# Patient Record
Sex: Male | Born: 1955 | Race: White | Hispanic: No | Smoking: Current every day smoker
Health system: Southern US, Community
[De-identification: ages and names within clinical notes are randomized; demographics above are authoritative.]

## PROBLEM LIST (undated history)

## (undated) ENCOUNTER — Emergency Department (HOSPITAL_COMMUNITY): Admission: EM | Payer: Self-pay | Source: Home / Self Care

## (undated) DIAGNOSIS — E119 Type 2 diabetes mellitus without complications: Secondary | ICD-10-CM

## (undated) DIAGNOSIS — I10 Essential (primary) hypertension: Secondary | ICD-10-CM

## (undated) DIAGNOSIS — E1351 Other specified diabetes mellitus with diabetic peripheral angiopathy without gangrene: Secondary | ICD-10-CM

## (undated) DIAGNOSIS — R569 Unspecified convulsions: Secondary | ICD-10-CM

## (undated) DIAGNOSIS — H409 Unspecified glaucoma: Secondary | ICD-10-CM

## (undated) DIAGNOSIS — I639 Cerebral infarction, unspecified: Secondary | ICD-10-CM

## (undated) DIAGNOSIS — E7800 Pure hypercholesterolemia, unspecified: Secondary | ICD-10-CM

## (undated) DIAGNOSIS — E1140 Type 2 diabetes mellitus with diabetic neuropathy, unspecified: Secondary | ICD-10-CM

## (undated) DIAGNOSIS — K219 Gastro-esophageal reflux disease without esophagitis: Secondary | ICD-10-CM

## (undated) DIAGNOSIS — I6529 Occlusion and stenosis of unspecified carotid artery: Secondary | ICD-10-CM

## (undated) HISTORY — PX: EYE SURGERY: SHX253

## (undated) HISTORY — PX: OTHER SURGICAL HISTORY: SHX169

---

## 2000-07-31 ENCOUNTER — Emergency Department (HOSPITAL_COMMUNITY): Admission: EM | Admit: 2000-07-31 | Discharge: 2000-07-31 | Payer: Self-pay | Admitting: Emergency Medicine

## 2000-08-17 ENCOUNTER — Ambulatory Visit (HOSPITAL_COMMUNITY): Admission: RE | Admit: 2000-08-17 | Discharge: 2000-08-17 | Payer: Self-pay | Admitting: Ophthalmology

## 2003-03-18 ENCOUNTER — Emergency Department (HOSPITAL_COMMUNITY): Admission: EM | Admit: 2003-03-18 | Discharge: 2003-03-18 | Payer: Self-pay | Admitting: Emergency Medicine

## 2005-03-09 ENCOUNTER — Emergency Department (HOSPITAL_COMMUNITY): Admission: EM | Admit: 2005-03-09 | Discharge: 2005-03-09 | Payer: Self-pay | Admitting: Emergency Medicine

## 2005-03-11 ENCOUNTER — Emergency Department (HOSPITAL_COMMUNITY): Admission: EM | Admit: 2005-03-11 | Discharge: 2005-03-11 | Payer: Self-pay | Admitting: Emergency Medicine

## 2006-09-18 ENCOUNTER — Emergency Department (HOSPITAL_COMMUNITY): Admission: EM | Admit: 2006-09-18 | Discharge: 2006-09-18 | Payer: Self-pay | Admitting: Emergency Medicine

## 2009-11-19 ENCOUNTER — Ambulatory Visit: Payer: Self-pay | Admitting: Cardiology

## 2009-11-19 ENCOUNTER — Inpatient Hospital Stay (HOSPITAL_COMMUNITY): Admission: EM | Admit: 2009-11-19 | Discharge: 2009-11-23 | Payer: Self-pay | Admitting: Internal Medicine

## 2009-11-19 ENCOUNTER — Encounter: Payer: Self-pay | Admitting: Emergency Medicine

## 2009-11-20 ENCOUNTER — Encounter (INDEPENDENT_AMBULATORY_CARE_PROVIDER_SITE_OTHER): Payer: Self-pay | Admitting: Internal Medicine

## 2009-11-22 ENCOUNTER — Ambulatory Visit: Payer: Self-pay | Admitting: Vascular Surgery

## 2009-12-06 ENCOUNTER — Emergency Department (HOSPITAL_COMMUNITY): Admission: EM | Admit: 2009-12-06 | Discharge: 2009-12-06 | Payer: Self-pay | Admitting: Emergency Medicine

## 2010-05-22 LAB — CULTURE, ROUTINE-ABSCESS

## 2010-05-22 LAB — CBC
HCT: 42.1 % (ref 39.0–52.0)
HCT: 43.3 % (ref 39.0–52.0)
Hemoglobin: 14.2 g/dL (ref 13.0–17.0)
MCH: 28.8 pg (ref 26.0–34.0)
MCH: 29 pg (ref 26.0–34.0)
MCHC: 32.9 g/dL (ref 30.0–36.0)
MCV: 87.5 fL (ref 78.0–100.0)
MCV: 88.8 fL (ref 78.0–100.0)
Platelets: 290 10*3/uL (ref 150–400)
Platelets: 286 10*3/uL (ref 150–400)
RBC: 4.9 MIL/uL (ref 4.22–5.81)
RBC: 4.88 MIL/uL (ref 4.22–5.81)
RDW: 13.1 % (ref 11.5–15.5)
RDW: 13 % (ref 11.5–15.5)
WBC: 11.3 10*3/uL — ABNORMAL HIGH (ref 4.0–10.5)
WBC: 10.7 10*3/uL — ABNORMAL HIGH (ref 4.0–10.5)

## 2010-05-22 LAB — POCT I-STAT, CHEM 8
HCT: 47 % (ref 39.0–52.0)
Hemoglobin: 16 g/dL (ref 13.0–17.0)
Sodium: 139 mEq/L (ref 135–145)
TCO2: 32 mmol/L (ref 0–100)

## 2010-05-22 LAB — CARDIAC PANEL(CRET KIN+CKTOT+MB+TROPI)
CK, MB: 6 ng/mL — ABNORMAL HIGH (ref 0.3–4.0)
CK, MB: 6.9 ng/mL (ref 0.3–4.0)
Relative Index: 5.6 — ABNORMAL HIGH (ref 0.0–2.5)
Relative Index: 5.9 — ABNORMAL HIGH (ref 0.0–2.5)
Total CK: 116 U/L (ref 7–232)

## 2010-05-22 LAB — DIFFERENTIAL
Basophils Absolute: 0.1 10*3/uL (ref 0.0–0.1)
Basophils Absolute: 0 10*3/uL (ref 0.0–0.1)
Basophils Relative: 1 % (ref 0–1)
Eosinophils Absolute: 0.3 10*3/uL (ref 0.0–0.7)
Eosinophils Relative: 2 % (ref 0–5)
Eosinophils Relative: 1 % (ref 0–5)
Lymphocytes Relative: 16 % (ref 12–46)
Lymphocytes Relative: 24 % (ref 12–46)
Lymphs Abs: 2.6 10*3/uL (ref 0.7–4.0)
Neutro Abs: 7.4 10*3/uL (ref 1.7–7.7)

## 2010-05-22 LAB — LIPID PANEL
Cholesterol: 109 mg/dL (ref 0–200)
LDL Cholesterol: 48 mg/dL (ref 0–99)
Total CHOL/HDL Ratio: 3.6 RATIO

## 2010-05-22 LAB — GLUCOSE, CAPILLARY
Glucose-Capillary: 105 mg/dL — ABNORMAL HIGH (ref 70–99)
Glucose-Capillary: 159 mg/dL — ABNORMAL HIGH (ref 70–99)
Glucose-Capillary: 277 mg/dL — ABNORMAL HIGH (ref 70–99)
Glucose-Capillary: 191 mg/dL — ABNORMAL HIGH (ref 70–99)
Glucose-Capillary: 222 mg/dL — ABNORMAL HIGH (ref 70–99)
Glucose-Capillary: 260 mg/dL — ABNORMAL HIGH (ref 70–99)

## 2010-05-22 LAB — CULTURE, BLOOD (ROUTINE X 2)

## 2010-05-22 LAB — BASIC METABOLIC PANEL
CO2: 30 mEq/L (ref 19–32)
Calcium: 9 mg/dL (ref 8.4–10.5)
Chloride: 107 mEq/L (ref 96–112)
Chloride: 103 mEq/L (ref 96–112)
Creatinine, Ser: 1.05 mg/dL (ref 0.4–1.5)
GFR calc Af Amer: >60 mL/min (ref >60)
GFR calc Af Amer: >60 mL/min (ref >60)
GFR calc non Af Amer: >60 mL/min (ref >60)
Potassium: 3.7 mEq/L (ref 3.5–5.1)
Potassium: 3.5 mEq/L (ref 3.5–5.1)
Sodium: 142 mEq/L (ref 135–145)

## 2010-05-22 LAB — APTT: aPTT: 32 seconds (ref 24–37)

## 2010-05-22 LAB — MRSA PCR SCREENING: MRSA by PCR: NEGATIVE

## 2010-05-22 LAB — HOMOCYSTEINE: Homocysteine: 10.3 umol/L (ref 4.0–15.4)

## 2010-05-22 LAB — PROTIME-INR: INR: 1.02 (ref 0.00–1.49)

## 2010-05-22 LAB — HEMOGLOBIN A1C
Hgb A1c MFr Bld: 8.4 % — ABNORMAL HIGH (ref <5.7)
Mean Plasma Glucose: 194 mg/dL — ABNORMAL HIGH (ref <117)

## 2010-12-23 LAB — DIFFERENTIAL
Basophils Absolute: 0.1
Basophils Relative: 1
Lymphocytes Relative: 28
Monocytes Relative: 5
Neutro Abs: 7.1
Neutrophils Relative %: 64

## 2010-12-23 LAB — POCT CARDIAC MARKERS
CKMB, poc: 1.7
CKMB, poc: <1 — ABNORMAL LOW
Myoglobin, poc: 38.7
Myoglobin, poc: 40.5
Operator id: 211291
Operator id: 221061
Troponin i, poc: <0.05
Troponin i, poc: <0.05

## 2010-12-23 LAB — BASIC METABOLIC PANEL
CO2: 25
Calcium: 9.5
Creatinine, Ser: 0.89
GFR calc Af Amer: >60
GFR calc non Af Amer: >60
Sodium: 135

## 2010-12-23 LAB — CBC
MCHC: 34
RBC: 5.69
RDW: 13.4

## 2011-03-16 HISTORY — PX: OTHER SURGICAL HISTORY: SHX169

## 2012-02-07 HISTORY — PX: OTHER SURGICAL HISTORY: SHX169

## 2012-03-03 ENCOUNTER — Emergency Department (HOSPITAL_COMMUNITY)
Admission: EM | Admit: 2012-03-03 | Discharge: 2012-03-03 | Disposition: A | Discharge status: Other Acute Inpatient Hospital | Payer: Non-veteran care | Attending: Emergency Medicine | Admitting: Emergency Medicine

## 2012-03-03 ENCOUNTER — Encounter (HOSPITAL_COMMUNITY): Payer: Self-pay

## 2012-03-03 ENCOUNTER — Emergency Department (HOSPITAL_COMMUNITY): Payer: Non-veteran care

## 2012-03-03 DIAGNOSIS — E1169 Type 2 diabetes mellitus with other specified complication: Secondary | ICD-10-CM | POA: Insufficient documentation

## 2012-03-03 DIAGNOSIS — I10 Essential (primary) hypertension: Secondary | ICD-10-CM | POA: Insufficient documentation

## 2012-03-03 DIAGNOSIS — Z8669 Personal history of other diseases of the nervous system and sense organs: Secondary | ICD-10-CM | POA: Insufficient documentation

## 2012-03-03 DIAGNOSIS — I891 Lymphangitis: Secondary | ICD-10-CM | POA: Insufficient documentation

## 2012-03-03 DIAGNOSIS — E1129 Type 2 diabetes mellitus with other diabetic kidney complication: Secondary | ICD-10-CM | POA: Insufficient documentation

## 2012-03-03 DIAGNOSIS — Z8719 Personal history of other diseases of the digestive system: Secondary | ICD-10-CM | POA: Insufficient documentation

## 2012-03-03 DIAGNOSIS — N058 Unspecified nephritic syndrome with other morphologic changes: Secondary | ICD-10-CM | POA: Insufficient documentation

## 2012-03-03 DIAGNOSIS — E7800 Pure hypercholesterolemia, unspecified: Secondary | ICD-10-CM | POA: Insufficient documentation

## 2012-03-03 DIAGNOSIS — R739 Hyperglycemia, unspecified: Secondary | ICD-10-CM

## 2012-03-03 DIAGNOSIS — E11628 Type 2 diabetes mellitus with other skin complications: Secondary | ICD-10-CM

## 2012-03-03 DIAGNOSIS — L97509 Non-pressure chronic ulcer of other part of unspecified foot with unspecified severity: Secondary | ICD-10-CM | POA: Insufficient documentation

## 2012-03-03 HISTORY — DX: Unspecified convulsions: R56.9

## 2012-03-03 HISTORY — DX: Pure hypercholesterolemia, unspecified: E78.00

## 2012-03-03 HISTORY — DX: Essential (primary) hypertension: I10

## 2012-03-03 HISTORY — DX: Type 2 diabetes mellitus with diabetic neuropathy, unspecified: E11.40

## 2012-03-03 HISTORY — DX: Gastro-esophageal reflux disease without esophagitis: K21.9

## 2012-03-03 LAB — CBC WITH DIFFERENTIAL/PLATELET
Basophils Absolute: 0.1 10*3/uL (ref 0.0–0.1)
Basophils Relative: 0 % (ref 0–1)
Eosinophils Absolute: 0.1 10*3/uL (ref 0.0–0.7)
Hemoglobin: 14.4 g/dL (ref 13.0–17.0)
MCH: 28.7 pg (ref 26.0–34.0)
MCHC: 34.4 g/dL (ref 30.0–36.0)
Neutro Abs: 9.8 10*3/uL — ABNORMAL HIGH (ref 1.7–7.7)
Neutrophils Relative %: 80 % — ABNORMAL HIGH (ref 43–77)
Platelets: 377 10*3/uL (ref 150–400)
RDW: 12 % (ref 11.5–15.5)

## 2012-03-03 LAB — HEPATIC FUNCTION PANEL
ALT: 8 U/L (ref 0–53)
AST: 10 U/L (ref 0–37)
Total Protein: 8.2 g/dL (ref 6.0–8.3)

## 2012-03-03 LAB — GLUCOSE, CAPILLARY: Glucose-Capillary: 316 mg/dL — ABNORMAL HIGH (ref 70–99)

## 2012-03-03 LAB — BASIC METABOLIC PANEL
Chloride: 92 mEq/L — ABNORMAL LOW (ref 96–112)
GFR calc Af Amer: 83 mL/min — ABNORMAL LOW (ref >90)
GFR calc non Af Amer: 71 mL/min — ABNORMAL LOW (ref >90)
Potassium: 4 mEq/L (ref 3.5–5.1)
Sodium: 130 mEq/L — ABNORMAL LOW (ref 135–145)

## 2012-03-03 MED ORDER — SODIUM CHLORIDE 0.9 % IV SOLN: INTRAVENOUS | Status: DC

## 2012-03-03 MED ORDER — VANCOMYCIN HCL IN DEXTROSE 1-5 GM/200ML-% IV SOLN
1000.0000 mg | Freq: Once | INTRAVENOUS | Status: AC
  Administered 2012-03-03: 1000 mg via INTRAVENOUS
  Filled 2012-03-03: qty 200

## 2012-03-03 MED ORDER — ONDANSETRON HCL 4 MG/2ML IJ SOLN
4.0000 mg | Freq: Once | INTRAMUSCULAR | Status: AC
  Administered 2012-03-03: 4 mg via INTRAVENOUS
  Filled 2012-03-03: qty 2

## 2012-03-03 MED ORDER — INSULIN ASPART 100 UNIT/ML ~~LOC~~ SOLN
8.0000 [IU] | Freq: Once | SUBCUTANEOUS | Status: AC
  Administered 2012-03-03: 8 [IU] via SUBCUTANEOUS
  Filled 2012-03-03: qty 1

## 2012-03-03 MED ORDER — MORPHINE SULFATE 4 MG/ML IJ SOLN
4.0000 mg | Freq: Once | INTRAMUSCULAR | Status: AC
  Administered 2012-03-03: 4 mg via INTRAVENOUS
  Filled 2012-03-03: qty 1

## 2012-03-03 MED ORDER — MORPHINE SULFATE 4 MG/ML IJ SOLN: 4.0000 mg | Freq: Once | INTRAMUSCULAR | Status: DC

## 2012-03-03 MED ORDER — SODIUM CHLORIDE 0.9 % IV BOLUS (SEPSIS)
1000.0000 mL | Freq: Once | INTRAVENOUS | Status: AC
  Administered 2012-03-03: 1000 mL via INTRAVENOUS

## 2012-03-03 NOTE — ED Notes (Signed)
Pt CBG is 316, Burgess Amor PA is aware.

## 2012-03-03 NOTE — ED Notes (Signed)
Report given to Jae Dire, Charity fundraiser at [place], [place] [facility].

## 2012-03-03 NOTE — ED Provider Notes (Signed)
History     CSN: [id number]  Arrival date & time 03/03/12  0830   First MD Initiated Contact with Patient 03/03/12 [phone]      Chief Complaint  Patient presents with  . Wound Check    (Consider location/radiation/quality/duration/timing/severity/associated sxs/prior treatment) HPI  Past Medical History  Diagnosis Date  . Diabetes mellitus without complication   . Diabetic neuropathy   . Seizures   . Hypertension   . Acid reflux   . Hypercholesteremia     Past Surgical History  Procedure Date  . Eye surgery     No family history on file.  History  Substance Use Topics  . Smoking status: Smoker, Current Status Unknown  . Smokeless tobacco: Not on file  . Alcohol Use: No      Review of Systems  Allergies  Flagyl and Penicillins  Home Medications  No current outpatient prescriptions on file.  BP 178/68  Pulse 67  Temp 97.7 F (36.5 C) (Oral)  Resp 16  Ht 5\' 9"  (1.753 m)  Wt 190 lb (86.183 kg)  BMI 28.06 kg/m2  SpO2 98%  Physical Exam  ED Course  Procedures (including critical care time)  Labs Reviewed  GLUCOSE, CAPILLARY - Abnormal; Notable for the following:    Glucose-Capillary 316 (*)     All other components within normal limits  CBC WITH DIFFERENTIAL - Abnormal; Notable for the following:    WBC 12.3 (*)     Neutrophils Relative 80 (*)     Neutro Abs 9.8 (*)     All other components within normal limits  BASIC METABOLIC PANEL - Abnormal; Notable for the following:    Sodium 130 (*)     Chloride 92 (*)     Glucose, Bld 361 (*)     GFR calc non Af Amer 71 (*)     GFR calc Af Amer 83 (*)     All other components within normal limits  HEPATIC FUNCTION PANEL - Abnormal; Notable for the following:    Albumin 3.3 (*)     All other components within normal limits  GLUCOSE, CAPILLARY - Abnormal; Notable for the following:    Glucose-Capillary 228 (*)     All other components within normal limits   Dg Foot Complete Left  03/03/2012   *RADIOLOGY REPORT*  Clinical Data: Pain and swelling over the great toe with erythema extending into the foot.  LEFT FOOT - COMPLETE 3+ VIEW  Comparison: None.  Findings: Soft tissue swelling is present over the great tear.  No underlying osseous abnormalities are present.  The joints located. A small plantar calcaneal spur is evident.  No other focal bone or soft tissue abnormality is present.  IMPRESSION:  1.  Soft tissue swelling of the great toe without evidence for acute osseous abnormality. 2.  Small plantar calcaneal spur.   Original Report Authenticated By: Marin Roberts, M.D.      1. Diabetic infection of left foot   2. Lymphangitis   3. Hyperglycemia     Pt was given IV vancomycin 1 gram prior to transfer to [facility].  MDM  Call placed to [facility] Dr. Candida Peeling who accepts this patient for ed to ed transfer / admission.    Patients labs and/or radiological studies were reviewed during the medical decision making and disposition process.     Burgess Amor, PA 03/03/12 1343  Burgess Amor, PA 03/03/12 1343

## 2012-03-03 NOTE — ED Notes (Signed)
Pt reports wound to left foot great toe for several weeks, "got to digging in it on Tuesday" and now painful and black

## 2012-03-03 NOTE — ED Notes (Signed)
Patient about to eat honeybun when I entered room.  Explained why he couldn't have this.  After checking w/Julie Idol, PA, ordered diabetic lunch tray for patient.  Explained delay on admission process.

## 2012-03-04 NOTE — ED Provider Notes (Signed)
Medical screening examination/treatment/procedure(s) were performed by non-physician practitioner and as supervising physician I was immediately available for consultation/collaboration.   Benny Lennert, MD 03/04/12 [phone]

## 2012-04-11 ENCOUNTER — Encounter (HOSPITAL_COMMUNITY): Payer: Self-pay

## 2012-04-11 ENCOUNTER — Emergency Department (HOSPITAL_COMMUNITY): Payer: Non-veteran care

## 2012-04-11 ENCOUNTER — Inpatient Hospital Stay (HOSPITAL_COMMUNITY)
Admission: EM | Admit: 2012-04-11 | Discharge: 2012-04-12 | DRG: 637 | Disposition: A | Discharge status: Other Acute Inpatient Hospital | Payer: Non-veteran care | Attending: Internal Medicine | Admitting: Internal Medicine

## 2012-04-11 DIAGNOSIS — D509 Iron deficiency anemia, unspecified: Secondary | ICD-10-CM | POA: Diagnosis present

## 2012-04-11 DIAGNOSIS — E1142 Type 2 diabetes mellitus with diabetic polyneuropathy: Secondary | ICD-10-CM | POA: Diagnosis present

## 2012-04-11 DIAGNOSIS — I739 Peripheral vascular disease, unspecified: Secondary | ICD-10-CM | POA: Diagnosis present

## 2012-04-11 DIAGNOSIS — G40909 Epilepsy, unspecified, not intractable, without status epilepticus: Secondary | ICD-10-CM | POA: Diagnosis present

## 2012-04-11 DIAGNOSIS — M9080 Osteopathy in diseases classified elsewhere, unspecified site: Secondary | ICD-10-CM | POA: Diagnosis present

## 2012-04-11 DIAGNOSIS — E1165 Type 2 diabetes mellitus with hyperglycemia: Secondary | ICD-10-CM | POA: Diagnosis present

## 2012-04-11 DIAGNOSIS — I798 Other disorders of arteries, arterioles and capillaries in diseases classified elsewhere: Secondary | ICD-10-CM | POA: Diagnosis present

## 2012-04-11 DIAGNOSIS — E1351 Other specified diabetes mellitus with diabetic peripheral angiopathy without gangrene: Secondary | ICD-10-CM | POA: Diagnosis present

## 2012-04-11 DIAGNOSIS — K219 Gastro-esophageal reflux disease without esophagitis: Secondary | ICD-10-CM | POA: Diagnosis present

## 2012-04-11 DIAGNOSIS — L03119 Cellulitis of unspecified part of limb: Secondary | ICD-10-CM | POA: Diagnosis present

## 2012-04-11 DIAGNOSIS — A480 Gas gangrene: Secondary | ICD-10-CM | POA: Diagnosis present

## 2012-04-11 DIAGNOSIS — E1149 Type 2 diabetes mellitus with other diabetic neurological complication: Secondary | ICD-10-CM | POA: Diagnosis present

## 2012-04-11 DIAGNOSIS — D649 Anemia, unspecified: Secondary | ICD-10-CM

## 2012-04-11 DIAGNOSIS — L02619 Cutaneous abscess of unspecified foot: Secondary | ICD-10-CM | POA: Diagnosis present

## 2012-04-11 DIAGNOSIS — E871 Hypo-osmolality and hyponatremia: Secondary | ICD-10-CM | POA: Diagnosis present

## 2012-04-11 DIAGNOSIS — S98119A Complete traumatic amputation of unspecified great toe, initial encounter: Secondary | ICD-10-CM

## 2012-04-11 DIAGNOSIS — R739 Hyperglycemia, unspecified: Secondary | ICD-10-CM

## 2012-04-11 DIAGNOSIS — E1159 Type 2 diabetes mellitus with other circulatory complications: Secondary | ICD-10-CM | POA: Diagnosis present

## 2012-04-11 DIAGNOSIS — M869 Osteomyelitis, unspecified: Secondary | ICD-10-CM | POA: Diagnosis present

## 2012-04-11 DIAGNOSIS — IMO0002 Reserved for concepts with insufficient information to code with codable children: Principal | ICD-10-CM | POA: Diagnosis present

## 2012-04-11 HISTORY — DX: Occlusion and stenosis of unspecified carotid artery: I65.29

## 2012-04-11 HISTORY — DX: Cerebral infarction, unspecified: I63.9

## 2012-04-11 HISTORY — DX: Type 2 diabetes mellitus without complications: E11.9

## 2012-04-11 HISTORY — DX: Unspecified glaucoma: H40.9

## 2012-04-11 HISTORY — DX: Other specified diabetes mellitus with diabetic peripheral angiopathy without gangrene: E13.51

## 2012-04-11 LAB — CBC WITH DIFFERENTIAL/PLATELET
Basophils Absolute: 0.1 10*3/uL (ref 0.0–0.1)
Eosinophils Relative: 1 % (ref 0–5)
HCT: 32.2 % — ABNORMAL LOW (ref 39.0–52.0)
Lymphocytes Relative: 10 % — ABNORMAL LOW (ref 12–46)
Lymphs Abs: 1.4 10*3/uL (ref 0.7–4.0)
MCV: 80.3 fL (ref 78.0–100.0)
Monocytes Absolute: 0.8 10*3/uL (ref 0.1–1.0)
Neutro Abs: 10.7 10*3/uL — ABNORMAL HIGH (ref 1.7–7.7)
Platelets: 462 10*3/uL — ABNORMAL HIGH (ref 150–400)
RBC: 4.01 MIL/uL — ABNORMAL LOW (ref 4.22–5.81)
RDW: 13.1 % (ref 11.5–15.5)
WBC: 13 10*3/uL — ABNORMAL HIGH (ref 4.0–10.5)

## 2012-04-11 LAB — GLUCOSE, CAPILLARY
Glucose-Capillary: 330 mg/dL — ABNORMAL HIGH (ref 70–99)
Glucose-Capillary: 283 mg/dL — ABNORMAL HIGH (ref 70–99)
Glucose-Capillary: 200 mg/dL — ABNORMAL HIGH (ref 70–99)
Glucose-Capillary: 152 mg/dL — ABNORMAL HIGH (ref 70–99)

## 2012-04-11 LAB — COMPREHENSIVE METABOLIC PANEL
ALT: 13 U/L (ref 0–53)
AST: 11 U/L (ref 0–37)
CO2: 29 mEq/L (ref 19–32)
Calcium: 9.3 mg/dL (ref 8.4–10.5)
Chloride: 92 mEq/L — ABNORMAL LOW (ref 96–112)
GFR calc Af Amer: 78 mL/min — ABNORMAL LOW (ref >90)
GFR calc non Af Amer: 67 mL/min — ABNORMAL LOW (ref >90)
Glucose, Bld: 346 mg/dL — ABNORMAL HIGH (ref 70–99)
Sodium: 130 mEq/L — ABNORMAL LOW (ref 135–145)
Total Bilirubin: 0.4 mg/dL (ref 0.3–1.2)

## 2012-04-11 MED ORDER — INSULIN ASPART 100 UNIT/ML ~~LOC~~ SOLN: 0.0000 [IU] | Freq: Every day | SUBCUTANEOUS | Status: DC

## 2012-04-11 MED ORDER — CIPROFLOXACIN IN D5W 400 MG/200ML IV SOLN
400.0000 mg | Freq: Two times a day (BID) | INTRAVENOUS | Status: DC
  Administered 2012-04-11 – 2012-04-12 (×3): 400 mg via INTRAVENOUS
  Filled 2012-04-11 (×4): qty 200

## 2012-04-11 MED ORDER — ALBUTEROL SULFATE (5 MG/ML) 0.5% IN NEBU: 2.5000 mg | INHALATION_SOLUTION | RESPIRATORY_TRACT | Status: DC | PRN

## 2012-04-11 MED ORDER — VANCOMYCIN HCL IN DEXTROSE 1-5 GM/200ML-% IV SOLN
1000.0000 mg | Freq: Once | INTRAVENOUS | Status: AC
  Administered 2012-04-11: 1000 mg via INTRAVENOUS
  Filled 2012-04-11: qty 200

## 2012-04-11 MED ORDER — INSULIN REGULAR HUMAN 100 UNIT/ML IJ SOLN: 8.0000 [IU] | Freq: Once | INTRAMUSCULAR | Status: DC

## 2012-04-11 MED ORDER — INSULIN ASPART 100 UNIT/ML ~~LOC~~ SOLN
0.0000 [IU] | Freq: Three times a day (TID) | SUBCUTANEOUS | Status: DC
  Administered 2012-04-11: 4 [IU] via SUBCUTANEOUS
  Administered 2012-04-12: 11 [IU] via SUBCUTANEOUS
  Administered 2012-04-12: 4 [IU] via SUBCUTANEOUS
  Administered 2012-04-12: 7 [IU] via SUBCUTANEOUS

## 2012-04-11 MED ORDER — CLINDAMYCIN PHOSPHATE 600 MG/50ML IV SOLN
600.0000 mg | Freq: Three times a day (TID) | INTRAVENOUS | Status: DC
Start: 2012-04-11 — End: 2012-04-13
  Administered 2012-04-11 – 2012-04-12 (×5): 600 mg via INTRAVENOUS
  Filled 2012-04-11 (×7): qty 50

## 2012-04-11 MED ORDER — INSULIN GLARGINE 100 UNIT/ML ~~LOC~~ SOLN
15.0000 [IU] | Freq: Every day | SUBCUTANEOUS | Status: DC
  Administered 2012-04-11: 15 [IU] via SUBCUTANEOUS

## 2012-04-11 MED ORDER — VANCOMYCIN HCL IN DEXTROSE 1-5 GM/200ML-% IV SOLN
1000.0000 mg | Freq: Two times a day (BID) | INTRAVENOUS | Status: DC
  Administered 2012-04-11 – 2012-04-12 (×2): 1000 mg via INTRAVENOUS
  Filled 2012-04-11 (×4): qty 200

## 2012-04-11 MED ORDER — CLINDAMYCIN PHOSPHATE 600 MG/50ML IV SOLN: 600.0000 mg | Freq: Three times a day (TID) | INTRAVENOUS | Status: DC

## 2012-04-11 MED ORDER — INSULIN ASPART 100 UNIT/ML ~~LOC~~ SOLN
8.0000 [IU] | Freq: Once | SUBCUTANEOUS | Status: AC
  Administered 2012-04-11: 8 [IU] via SUBCUTANEOUS
  Filled 2012-04-11: qty 1

## 2012-04-11 MED ORDER — SODIUM CHLORIDE 0.9 % IV SOLN
Freq: Once | INTRAVENOUS | Status: AC
  Administered 2012-04-11: 11:00:00 via INTRAVENOUS

## 2012-04-11 MED ORDER — POTASSIUM CHLORIDE IN NACL 20-0.9 MEQ/L-% IV SOLN
INTRAVENOUS | Status: DC
  Administered 2012-04-11: 100 mL/h via INTRAVENOUS
  Administered 2012-04-12: 08:00:00 via INTRAVENOUS

## 2012-04-11 NOTE — ED Provider Notes (Signed)
Medical screening examination/treatment/procedure(s) were performed by non-physician practitioner and as supervising physician I was immediately available for consultation/collaboration.   Monigue Spraggins L Omaya Nieland, MD 04/11/12 1444 

## 2012-04-11 NOTE — ED Notes (Signed)
Patient given water per RN. Patient wants to be transferred to [facility] if he needs to be admitted. RN made aware.

## 2012-04-11 NOTE — Progress Notes (Signed)
ANTIBIOTIC CONSULT NOTE - INITIAL  Pharmacy Consult for Vancomycin Indication: osteomyelitis/cellulitis/gangrene  Allergies  Allergen Reactions  . Flagyl (Metronidazole) Itching and Rash  . Penicillins Itching and Rash    Patient Measurements: Height: 5\' 9"  (175.3 cm) Weight: 181 lb (82.101 kg) IBW/kg (Calculated) : 70.7   Vital Signs: Temp: 98.5 F (36.9 C) (02/03 0853) Temp src: Oral (02/03 0853) BP: 133/58 mmHg (02/03 1642) Pulse Rate: 73  (02/03 1642) Intake/Output from previous day:   Intake/Output from this shift:    Labs:  Basename 04/11/12 1011  WBC 13.0*  HGB 10.7*  PLT 462*  LABCREA --  CREATININE 1.19   Estimated Creatinine Clearance: 70.1 ml/min (by C-G formula based on Cr of 1.19). No results found for this basename: VANCOTROUGH:2,VANCOPEAK:2,VANCORANDOM:2,GENTTROUGH:2,GENTPEAK:2,GENTRANDOM:2,TOBRATROUGH:2,TOBRAPEAK:2,TOBRARND:2,AMIKACINPEAK:2,AMIKACINTROU:2,AMIKACIN:2, in the last 72 hours   Microbiology: Recent Results (from the past 720 hour(s))  CULTURE, BLOOD (ROUTINE X 2)     Status: Normal (Preliminary result)   Collection Time   04/11/12 10:21 AM      Component Value Range Status Comment   Specimen Description BLOOD RIGHT HAND   Final    Special Requests BOTTLES DRAWN AEROBIC AND ANAEROBIC 6CC   Final    Culture NO GROWTH <24 HRS   Final    Report Status PENDING   Incomplete   CULTURE, BLOOD (ROUTINE X 2)     Status: Normal (Preliminary result)   Collection Time   04/11/12 10:22 AM      Component Value Range Status Comment   Specimen Description BLOOD RIGHT ANTECUBITAL DRAWN BY RN GM   Final    Special Requests BOTTLES DRAWN AEROBIC AND ANAEROBIC 5CC   Final    Culture NO GROWTH <24 HRS   Final    Report Status PENDING   Incomplete     Medical History: Past Medical History  Diagnosis Date  . Diabetes mellitus   . Diabetic neuropathy   . Seizures   . Hypertension   . Acid reflux   . Hypercholesteremia   . Peripheral vascular disease  due to secondary diabetes mellitus     Medications:  Scheduled:    . insulin aspart  0-20 Units Subcutaneous TID WC  . insulin aspart  0-5 Units Subcutaneous QHS  . [COMPLETED] insulin aspart  8 Units Subcutaneous Once  . insulin glargine  15 Units Subcutaneous QHS  . [DISCONTINUED] insulin regular  8 Units Subcutaneous Once   Assessment: 57 yo M with gas gangrene of left foot/lower extremity/osteomyelitis of left metatarsals/cellulitis and abscess of left foot.  He had left fem-->tib bypass on Mar 15, 2012 and left great toe amputation in Dec 2013 at [facility] with questionable follow-up wound care.   His renal function is ok.  He received doses of Vancomycin 1gm , Clindamycin, and Cipro in ED today.   Goal of Therapy:  Vancomycin trough level 15-20 mcg/ml  Plan:  1) Vancomycin 1gm IV q12h 2) Check Vancomycin trough at steady state 3) Monitor renal function and cx data   Elson Clan 04/11/2012,5:20 PM

## 2012-04-11 NOTE — H&P (Signed)
[facility] History and Physical  Ian Garcia ZOX:[id number] DOB: Jul 14, 1955 DOA: 04/11/2012  Referring physician: Pauline Aus, PA Valeria Batman, M.D.) PCP: [facility], Dr. Moshe Cipro  Specialists:   Chief Complaint: Left foot swelling and drainage.  HPI: Ian Garcia is a 57 y.o. male with a history significant for diabetes mellitus, peripheral vascular disease, and seizure disorder. He was recently seen in the emergency department at [facility] for a left great toe infection. He was subsequently transferred to the [place] in [place]. Per history, the patient underwent a left great toe amputation. Today, he returns with a chief complaint of left foot swelling, drainage, and some pain. He does not recall the names of his surgeons at the [place], but apparently, he underwent vascular bypass of his left leg vessels. He states that he was hospitalized for 2 weeks at the [facility]. He followed up with his surgeon and his primary care physician, but he says that no one look at his wound site. He still has staples intact in the vascular procedure site, but they were supposed to be taken out one to 2 weeks ago. He denies having had home health nursing for wound care. He says that his mother and another family member have been soaking his foot in cleansing his foot as he did during his hospitalization. They noticed that the foot was becoming more red, more swollen, and the amputation site had become black. The area began to drain pus which was very malodorous. He has some mild tenderness of the plantar surface of his left foot, only when it is palpated, but no rest pain. He denies associated fever, chills, nausea, vomiting, or diarrhea. One week ago, he stopped taking all of his medications ( of which he cannot recall the names) because it caused him to have a poor appetite.  In the emergency department, the patient is afebrile and hemodynamically stable. His lab data are significant  for WBC of 13.0, hemoglobin of 10.7, sodium of 130, and glucose of 346. X-ray of his left foot reveals a large amount of soft tissue gas seen around the second and third metatarsal phalangeal joints, cellulitis, and osteomyelitis involving the proximal portions of the second and third proximal phalanges. He is being admitted for further evaluation and management.   Review of Systems: As above in history present illness, otherwise negative.  Past Medical History  Diagnosis Date  . Diabetes mellitus   . Diabetic neuropathy   . Seizures   . Hypertension   . Acid reflux   . Hypercholesteremia   . Peripheral vascular disease due to secondary diabetes mellitus    Past Surgical History  Procedure Date  . Eye surgery   . Left femoral to tibial [facility]/16/[facility]  . Amputation left great toe 02/2012    [facility]   Social History: The patient is single. He lives in [place]. He has 2 children. He receives disability. He denies tobacco, alcohol, and illicit drug use. reports that he has been smoking.  He does not have any smokeless tobacco history on file. He reports that he does not drink alcohol or use illicit drugs.   Allergies  Allergen Reactions  . Flagyl (Metronidazole) Itching and Rash  . Penicillins Itching and Rash    Family history: His mother is 56 years of age and has multiple medical problems including hypertension and diabetes. His father died of a heart attack.  Prior to Admission  medications   Not on File   Physical Exam: Filed Vitals:   04/11/12 0853 04/11/12 0855 04/11/12 1146 04/11/12 1333  BP: 152/73  155/88 108/58  Pulse: 81  76 61  Temp: 98.5 F (36.9 C)     TempSrc: Oral     Resp: 18  16 18   Height: 5\' 9"  (1.753 m) 5\' 9"  (1.753 m)    Weight: 82.101 kg (181 lb) 82.101 kg (181 lb)    SpO2: 96%  97% 96%     General:  Alert 57 year old Caucasian man sitting up in bed, in no acute distress.  Eyes: Pupils are equal, round, and  reactive to light. Extraocular movements are intact. Conjunctivae are clear. Sclerae are white.  ENT: Oropharynx reveals mildly dry mucous membranes. Multiple missing teeth. Poor dentition. No exudates or erythema.  Neck: Supple, no adenopathy, no thyromegaly, no JVD.  Cardiovascular: S1, S2, with no murmurs rubs or gallops.  Respiratory: Decreased breath sounds in the bases, otherwise clear.  Abdomen: Positive bowel sounds, soft, nontender, nondistended.  Skin: See below.  Musculoskeletal: Long healing incision with staples in place from the left proximal anterior thigh to above the left ankle. No surrounding erythema or drainage. Left foot left great toe amputation site black necrotic base and purulent malodorous drainage when site is palpated. Bluish and tan discoloration of the second and third toes and mild to moderate erythema of the fourth and fifth toes on the left foot. DP pulse barely palpable. Mild to moderate tenderness to palpation of the proximal plantar surface left foot. Right lower extremity without edema.  Psychiatric: He is alert and oriented to himself, year, and hospital, but his memory appears to be compromised regarding his medical history, medications, and physician's names.  Neurologic: Cranial nerves II through XII are intact. Strength is 5 over 5 with exception of the left lower extremity which was not tested for strength. Sensation grossly decreased on both dorsal surfaces of both feet.  Labs on Admission:  Basic Metabolic Panel:  Lab 04/11/12 8119  NA 130*  K 4.3  CL 92*  CO2 29  GLUCOSE 346*  BUN 15  CREATININE 1.19  CALCIUM 9.3  MG --  PHOS --   Liver Function Tests:  Lab 04/11/12 1011  AST 11  ALT 13  ALKPHOS 91  BILITOT 0.4  PROT 7.7  ALBUMIN 2.5*   No results found for this basename: LIPASE:5,AMYLASE:5 in the last 168 hours No results found for this basename: AMMONIA:5 in the last 168 hours CBC:  Lab 04/11/12 1011  WBC 13.0*   NEUTROABS 10.7*  HGB 10.7*  HCT 32.2*  MCV 80.3  PLT 462*   Cardiac Enzymes: No results found for this basename: CKTOTAL:5,CKMB:5,CKMBINDEX:5,TROPONINI:5 in the last 168 hours  BNP (last 3 results) No results found for this basename: PROBNP:3 in the last 8760 hours CBG:  Lab 04/11/12 1336 04/11/12 0900  GLUCAP 283* 330*    Radiological Exams on Admission: Dg Foot Complete Left  04/11/2012  *RADIOLOGY REPORT*  Clinical Data: Foot pain and swelling.  LEFT FOOT - COMPLETE 3+ VIEW  Comparison: March 03, 2012.  Findings: Status post amputation of left big toe.  There does appear to be lytic destruction involving the distal portion of the first metatarsal as well as involving portions of the second and third proximal phalanges.  A large amount of soft tissue gas is seen around the second and third metatarsophalangeal joints.  IMPRESSION: Findings consistent with cellulitis around the second and third metatarsophalangeal  joints as well as osteomyelitis involving the proximal portions of the second and third proximal phalanges.   Original Report Authenticated By: Lupita Raider.,  M.D.     EKG: Not ordered.  Assessment/Plan Active Problems:  Gas gangrene of lower extremity  Cellulitis and abscess of foot  Osteomyelitis of metatarsal  Type II diabetes mellitus with complication, uncontrolled  Hyponatremia  Anemia  Peripheral vascular disease due to secondary diabetes mellitus  Seizure disorder   1. 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 The patient is status post left femoral to tibial bypass on 03/15/2012 by Dr. Magda Bernheim and status post left great toe amputation December 2013, by Dr. Kenyon Ana, per my conversation with his primary care physician at the [facility], Dr. Moshe Cipro. PA, [facility], [facility] are no [facility] beds available at this time. The patient does not appear to be  toxic, but he is certainly at risk of decompensation. Surprisingly, he has not febrile. His white blood cell count is modestly elevated.  2. 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 The patient has been noncompliant with his medications because it interfered with his appetite. 3. Hyponatremia. This is likely secondary to hypovolemia and hyperglycemia. 4. Normocytic anemia. This is likely chronic and possibly residual from blood loss from previous recent surgeries. 5. Reported seizure disorder. Again, the patient has been noncompliant with medication therapy. He does not recall the names of any of his medications. He denies any recent seizures.      Plan: 1. Will try to contact the patient's surgeons as soon as possible regarding his presentation and desired transfer for continuity of care. 2. Will discuss the patient with general surgery here. 3. We'll broaden antibiotic therapy to add clindamycin and Cipro. We'll continue vancomycin. 4. Wound care. We'll keep the staples in until his vascular surgeon is contacted via phone call. 5. IV fluid hydration with normal saline. 6. Diabetes management with sliding scale NovoLog and Lantus. We'll check a hemoglobin A1c. 7. Will check anemia studies. Will assess his thyroid function with TSH. 8. Blood cultures were ordered in the ED. We'll followup on the results.    Code Status: Full code Family Communication: No family available Disposition Plan: To be determined. The plan is to try to get the patient transferred to the [facility] in [place] for continuity of care as soon as possible.  Time spent: One hour and 15 minutes.  [facility] Pager [phone].  If 7PM-7AM, please contact night-coverage [email or web address] Password [facility] 04/11/2012, 4:24 PM

## 2012-04-11 NOTE — ED Notes (Signed)
RN at bedside

## 2012-04-11 NOTE — ED Notes (Signed)
Patient is comfortable at this time. 

## 2012-04-11 NOTE — ED Provider Notes (Signed)
History     CSN: [id number]  Arrival date & time 04/11/12  0850   First MD Initiated Contact with Patient 04/11/12 [phone]      Chief Complaint  Patient presents with  . Foot Pain    (Consider location/radiation/quality/duration/timing/severity/associated sxs/prior treatment) HPI Comments: Patient with history of diabetes and diabetic neuropathy comes to the ER with the complaint of wound infection. He states that his heart left great toe was amputated approximately 2 weeks ago secondary to gangrene. He states that his surgeon was at [facility], but he typically receives medical care at the [facility] in [facility].  Since the surgery he states that he has been cleaning the wound and dressing it daily but noticed drainage, redness and odor to the wound for several days. He has not received any follow-up care his the surgery.   He also noticed a bluish discoloration to the second and third toes with redness of the distal foot and the fourth and fifth toes.  He also states that he has stopped his regular medications for 2 days.  He denies vomiting, fever, or chills.   Patient is a 57 y.o. male presenting with lower extremity pain. The history is provided by the patient.  Foot Pain This is a new problem. The current episode started in the past 7 days. The problem occurs constantly. The problem has been unchanged. Associated symptoms include arthralgias and numbness. Pertinent negatives include no abdominal pain, chills, congestion, coughing, fever, joint swelling, nausea, neck pain, rash, vomiting or weakness. Exacerbated by: movement and palpation. He has tried nothing for the symptoms. The treatment provided no relief.    Past Medical History  Diagnosis Date  . Diabetes mellitus without complication   . Diabetic neuropathy   . Seizures   . Hypertension   . Acid reflux   . Hypercholesteremia     Past Surgical History  Procedure Date  . Eye surgery   . Vein removal   . Great toe  amputated     No family history on file.  History  Substance Use Topics  . Smoking status: Smoker, Current Status Unknown  . Smokeless tobacco: Not on file  . Alcohol Use: No      Review of Systems  Constitutional: Negative for fever and chills.  HENT: Negative for congestion and neck pain.   Respiratory: Negative for cough and chest tightness.   Gastrointestinal: Negative for nausea, vomiting and abdominal pain.  Genitourinary: Negative for dysuria and difficulty urinating.  Musculoskeletal: Positive for arthralgias. Negative for joint swelling.  Skin: Positive for color change and wound. Negative for rash.       Left distal foot  Neurological: Positive for numbness. Negative for weakness.  All other systems reviewed and are negative.    Allergies  Flagyl and Penicillins  Home Medications  No current outpatient prescriptions on file.  BP 155/88  Pulse 76  Temp 98.5 F (36.9 C) (Oral)  Resp 16  Ht 5\' 9"  (1.753 m)  Wt 181 lb (82.101 kg)  BMI 26.73 kg/m2  SpO2 97%  Physical Exam  Nursing note and vitals reviewed. Constitutional: He is oriented to person, place, and time. He appears well-developed and well-nourished. No distress.  HENT:  Head: Normocephalic and atraumatic.  Neck: Normal range of motion. Neck supple.  Cardiovascular: Normal rate, regular rhythm, normal heart sounds and intact distal pulses.   No murmur heard. Pulmonary/Chest: Effort normal and breath sounds normal. No respiratory distress.  Abdominal: Soft. He exhibits no  distension. There is no tenderness.  Musculoskeletal: He exhibits edema and tenderness.       S/p amputation of the left great toe.  Wound is open with malodorous drainage present.  Bluish discoloration to the second and third toes and erythema of the fourth and fifth toes.  STS of the distal foot.  Sensation to the fourth and fifth toes only. DP pulse is palpable.  Stapled wound closure of the left left up to the groin.     Neurological: He is alert and oriented to person, place, and time. He exhibits normal muscle tone. Coordination normal.  Skin:       See MS exam    ED Course  Procedures (including critical care time)  Labs Reviewed  GLUCOSE, CAPILLARY - Abnormal; Notable for the following:    Glucose-Capillary 330 (*)     All other components within normal limits  CBC WITH DIFFERENTIAL - Abnormal; Notable for the following:    WBC 13.0 (*)     RBC 4.01 (*)     Hemoglobin 10.7 (*)     HCT 32.2 (*)     Platelets 462 (*)     Neutrophils Relative 82 (*)     Neutro Abs 10.7 (*)     Lymphocytes Relative 10 (*)     All other components within normal limits  COMPREHENSIVE METABOLIC PANEL - Abnormal; Notable for the following:    Sodium 130 (*)     Chloride 92 (*)     Glucose, Bld 346 (*)     Albumin 2.5 (*)     GFR calc non Af Amer 67 (*)     GFR calc Af Amer 78 (*)     All other components within normal limits  CULTURE, BLOOD (ROUTINE X 2)  CULTURE, BLOOD (ROUTINE X 2)   Dg Foot Complete Left  04/11/2012  *RADIOLOGY REPORT*  Clinical Data: Foot pain and swelling.  LEFT FOOT - COMPLETE 3+ VIEW  Comparison: March 03, 2012.  Findings: Status post amputation of left big toe.  There does appear to be lytic destruction involving the distal portion of the first metatarsal as well as involving portions of the second and third proximal phalanges.  A large amount of soft tissue gas is seen around the second and third metatarsophalangeal joints.  IMPRESSION: Findings consistent with cellulitis around the second and third metatarsophalangeal joints as well as osteomyelitis involving the proximal portions of the second and third proximal phalanges.   Original Report Authenticated By: Lupita Raider.,  M.D.      Blood cultures are pending.   MDM    IV Vancomycin given in the ED.  Patient resting comfortably.  [facility] [place] [facility]  Spoke with Dr. Moshe Cipro, at [facility], agrees that patient  needs admission, request I consult AOD.  I was then transferred to patient's nurse Selinda Michaels ext. [phone] that there are no medical beds available at this time.    14:15  Consulted Dr. Sherrie Mustache who agrees to admit.        Sevan Mcbroom L. Thu [place], [place] 04/11/12 1423

## 2012-04-11 NOTE — ED Notes (Signed)
Received records from [facility] as requested by Dr. Sherrie Mustache.

## 2012-04-11 NOTE — ED Notes (Signed)
EMS reports pt usually goes to the [facility] hospital.  Reports had left great toe removed approx 2 weeks ago due to gangrene.   Reports wound appears infected and swelling has spread to other toes.  Reports leg also warm to touch.  Pt has laceration with sutures intact up pt's left leg because of vein removal.  EMS dressed wound.  CBG 394.  Has been off of his medication for the past 2 days because it caused him to lose his taste.

## 2012-04-12 ENCOUNTER — Encounter (HOSPITAL_COMMUNITY): Payer: Self-pay | Admitting: Internal Medicine

## 2012-04-12 DIAGNOSIS — D649 Anemia, unspecified: Secondary | ICD-10-CM

## 2012-04-12 LAB — COMPREHENSIVE METABOLIC PANEL
ALT: 13 U/L (ref 0–53)
BUN: 11 mg/dL (ref 6–23)
Calcium: 8.8 mg/dL (ref 8.4–10.5)
GFR calc Af Amer: 87 mL/min — ABNORMAL LOW (ref >90)
Glucose, Bld: 263 mg/dL — ABNORMAL HIGH (ref 70–99)
Sodium: 131 mEq/L — ABNORMAL LOW (ref 135–145)
Total Protein: 6.8 g/dL (ref 6.0–8.3)

## 2012-04-12 LAB — GLUCOSE, CAPILLARY
Glucose-Capillary: 248 mg/dL — ABNORMAL HIGH (ref 70–99)
Glucose-Capillary: 176 mg/dL — ABNORMAL HIGH (ref 70–99)

## 2012-04-12 LAB — CBC
Hemoglobin: 9.4 g/dL — ABNORMAL LOW (ref 13.0–17.0)
MCH: 26.2 pg (ref 26.0–34.0)
MCHC: 32.8 g/dL (ref 30.0–36.0)
Platelets: 454 10*3/uL — ABNORMAL HIGH (ref 150–400)
RDW: 13.2 % (ref 11.5–15.5)

## 2012-04-12 LAB — TSH: TSH: 1.471 u[IU]/mL (ref 0.350–4.500)

## 2012-04-12 LAB — HEMOGLOBIN A1C
Hgb A1c MFr Bld: 9.5 % — ABNORMAL HIGH (ref <5.7)
Mean Plasma Glucose: 226 mg/dL — ABNORMAL HIGH (ref <117)

## 2012-04-12 LAB — VITAMIN B12: Vitamin B-12: 270 pg/mL (ref 211–911)

## 2012-04-12 LAB — IRON AND TIBC
Iron: 19 ug/dL — ABNORMAL LOW (ref 42–135)
UIBC: 152 ug/dL (ref 125–400)

## 2012-04-12 MED ORDER — ATORVASTATIN CALCIUM 20 MG PO TABS
20.0000 mg | ORAL_TABLET | Freq: Every day | ORAL | Status: DC
  Administered 2012-04-12: 20 mg via ORAL
  Filled 2012-04-12: qty 1

## 2012-04-12 MED ORDER — INSULIN GLARGINE 100 UNIT/ML ~~LOC~~ SOLN
25.0000 [IU] | Freq: Every day | SUBCUTANEOUS | Status: DC
  Administered 2012-04-12: 25 [IU] via SUBCUTANEOUS

## 2012-04-12 MED ORDER — LEVETIRACETAM 500 MG PO TABS
750.0000 mg | ORAL_TABLET | Freq: Every day | ORAL | Status: DC
  Administered 2012-04-12: 750 mg via ORAL
  Filled 2012-04-12: qty 2

## 2012-04-12 MED ORDER — PANTOPRAZOLE SODIUM 40 MG PO TBEC
40.0000 mg | DELAYED_RELEASE_TABLET | Freq: Every day | ORAL | Status: DC
  Administered 2012-04-12: 40 mg via ORAL
  Filled 2012-04-12: qty 1

## 2012-04-12 MED ORDER — LOSARTAN POTASSIUM 50 MG PO TABS
25.0000 mg | ORAL_TABLET | Freq: Every day | ORAL | Status: DC
  Administered 2012-04-12: 25 mg via ORAL
  Filled 2012-04-12: qty 1

## 2012-04-12 NOTE — Progress Notes (Signed)
UR Chart Review Completed  

## 2012-04-12 NOTE — Progress Notes (Signed)
Inpatient Diabetes Program Recommendations  AACE/ADA: New Consensus Statement on Inpatient Glycemic Control (2013)  Target Ranges:  Prepandial:   less than 140 mg/dL      Peak postprandial:   less than 180 mg/dL (1-2 hours)      Critically ill patients:  140 - 180 mg/dL     Inpatient Diabetes Program Recommendations Insulin - Basal: Please consider increasing Lantus to 23 units QHS (based on 0.3 units x 77.7 kg).  Note: Patient is currently ordered Lantus 15 units QHS and Novolog resistance correction ACHS for inpatient glycemic control.  Fasting blood glucose this morning was 248 mg/dl.  Please consider increasing Lantus to 23 units QHS which is based on 0.3 units x 77.7 kg.  Will continue to follow.  Thanks, Orlando Penner, RN, BSN, CCRN Diabetes Coordinator Inpatient Diabetes Program [phone]

## 2012-04-12 NOTE — Progress Notes (Signed)
Subjective: The patient has no complaints of foot pain at rest, chest pain, shortness of breath, nausea, vomiting, or diarrhea. He states that he quit smoking 2 months ago.  Objective: Vital signs in last 24 hours: Filed Vitals:   04/11/12 1748 04/11/12 2143 04/12/12 0341 04/12/12 0510  BP: 129/69 135/70  149/74  Pulse: 74 74  77  Temp: 98.5 F (36.9 C) 99.7 F (37.6 C)  98.3 F (36.8 C)  TempSrc: Oral Oral  Oral  Resp: 18 20  18   Height: 5\' 9"  (1.753 m)     Weight: 82.101 kg (181 lb)  77.7 kg (171 lb 4.8 oz)   SpO2: 99% 97%  94%    Intake/Output Summary (Last 24 hours) at 04/12/12 1040 Last data filed at 04/12/12 1478  Gross per 24 hour  Intake 2311.67 ml  Output      0 ml  Net 2311.67 ml    Weight change:   Physical exam: General: Alert 57 year old Caucasian man laying in bed, in no acute distress. Lungs: Clear anteriorly with decreased breath sounds in the bases. Heart: S1, S2, with no murmurs rubs or gallops. Abdomen: Positive bowel sounds, soft, nontender, nondistended. Extremities: Right lower extremity with no pedal edema. Left lower extremity exam essentially unchanged: Long healing incision in place from the left proximal anterior thigh to above the left ankle. No surrounding erythema or drainage over the incision site. Left foot great toe amputation site that a very malodorous black necrotic base with purulent malodorous drainage when the area is palpated. Bluish and tan discoloration of the second and third toes and with mild to moderate erythema of the fourth and fifth toes. The second and third toes are swollen and draining malodorous purulent drainage. The plantar surface is mildly tender to palpation. DP pulse barely palpable. Neurologic: He is alert and oriented x3. Cranial nerves grossly intact.  Lab Results: Basic Metabolic Panel:  Basename 04/12/12 0533 04/11/12 1011  NA 131* 130*  K 4.2 4.3  CL 96 92*  CO2 26 29  GLUCOSE 263* 346*  BUN 11 15   CREATININE 1.08 1.19  CALCIUM 8.8 9.3  MG -- --  PHOS -- --   Liver Function Tests:  Basename 04/12/12 0533 04/11/12 1011  AST 13 11  ALT 13 13  ALKPHOS 79 91  BILITOT 0.4 0.4  PROT 6.8 7.7  ALBUMIN 2.2* 2.5*   No results found for this basename: LIPASE:2,AMYLASE:2 in the last 72 hours No results found for this basename: AMMONIA:2 in the last 72 hours CBC:  Basename 04/12/12 0533 04/11/12 1011  WBC 11.6* 13.0*  NEUTROABS -- 10.7*  HGB 9.4* 10.7*  HCT 28.7* 32.2*  MCV 79.9 80.3  PLT 454* 462*   Cardiac Enzymes: No results found for this basename: CKTOTAL:3,CKMB:3,CKMBINDEX:3,TROPONINI:3 in the last 72 hours BNP: No results found for this basename: PROBNP:3 in the last 72 hours D-Dimer: No results found for this basename: DDIMER:2 in the last 72 hours CBG:  Basename 04/12/12 0801 04/11/12 2145 04/11/12 1753 04/11/12 1336 04/11/12 0900  GLUCAP 248* 152* 200* 283* 330*   Hemoglobin A1C:  Basename 04/11/12 1725  HGBA1C 9.5*   Fasting Lipid Panel: No results found for this basename: CHOL,HDL,LDLCALC,TRIG,CHOLHDL,LDLDIRECT in the last 72 hours Thyroid Function Tests:  Basename 04/11/12 1725  TSH 1.471  T4TOTAL --  FREET4 --  T3FREE --  THYROIDAB --   Anemia Panel: No results found for this basename: VITAMINB12,FOLATE,FERRITIN,TIBC,IRON,RETICCTPCT in the last 72 hours Coagulation: No results found for this  basename: LABPROT:2,INR:2 in the last 72 hours Urine Drug Screen: Drugs of Abuse  No results found for this basename: labopia, cocainscrnur, labbenz, amphetmu, thcu, labbarb    Alcohol Level: No results found for this basename: ETH:2 in the last 72 hours Urinalysis: No results found for this basename: COLORURINE:2,APPERANCEUR:2,LABSPEC:2,PHURINE:2,GLUCOSEU:2,HGBUR:2,BILIRUBINUR:2,KETONESUR:2,PROTEINUR:2,UROBILINOGEN:2,NITRITE:2,LEUKOCYTESUR:2 in the last 72 hours Misc. Labs:   Micro: Recent Results (from the past 240 hour(s))  CULTURE, BLOOD (ROUTINE  X 2)     Status: Normal (Preliminary result)   Collection Time   04/11/12 10:21 AM      Component Value Range Status Comment   Specimen Description BLOOD RIGHT HAND   Final    Special Requests BOTTLES DRAWN AEROBIC AND ANAEROBIC 6CC   Final    Culture NO GROWTH 1 DAY   Final    Report Status PENDING   Incomplete   CULTURE, BLOOD (ROUTINE X 2)     Status: Normal (Preliminary result)   Collection Time   04/11/12 10:22 AM      Component Value Range Status Comment   Specimen Description BLOOD RIGHT ANTECUBITAL DRAWN BY RN GM   Final    Special Requests BOTTLES DRAWN AEROBIC AND ANAEROBIC 5CC   Final    Culture NO GROWTH 1 DAY   Final    Report Status PENDING   Incomplete     Studies/Results: Dg Foot Complete Left  04/11/2012  *RADIOLOGY REPORT*  Clinical Data: Foot pain and swelling.  LEFT FOOT - COMPLETE 3+ VIEW  Comparison: March 03, 2012.  Findings: Status post amputation of left big toe.  There does appear to be lytic destruction involving the distal portion of the first metatarsal as well as involving portions of the second and third proximal phalanges.  A large amount of soft tissue gas is seen around the second and third metatarsophalangeal joints.  IMPRESSION: Findings consistent with cellulitis around the second and third metatarsophalangeal joints as well as osteomyelitis involving the proximal portions of the second and third proximal phalanges.   Original Report Authenticated By: Lupita Raider.,  M.D.     Medications:  Scheduled:   . ciprofloxacin  400 mg Intravenous Q12H  . clindamycin (CLEOCIN) IV  600 mg Intravenous Q8H  . insulin aspart  0-20 Units Subcutaneous TID WC  . insulin aspart  0-5 Units Subcutaneous QHS  . insulin glargine  15 Units Subcutaneous QHS  . levETIRAcetam  750 mg Oral QHS  . losartan  25 mg Oral Daily  . pantoprazole  40 mg Oral Daily  . vancomycin  1,000 mg Intravenous Q12H   Continuous:   . 0.9 % NaCl with KCl 20 mEq / L 50 mL/hr at 04/12/12  6213   YQM:[id number]  Assessment: Active Problems:  Gas gangrene of lower extremity  Cellulitis and abscess of foot  Osteomyelitis of metatarsal  Type II diabetes mellitus with complication, uncontrolled  Hyponatremia  Anemia  Peripheral vascular disease due to secondary diabetes mellitus  Seizure disorder    1. Gas gangrene of left foot/lower extremity/osteomyelitis of the left metatarsal-phalangeal joints of the second and third/cellulitis and abscess of the left foot. The patient is status post left femoral to posterior tibial bypass on 03/15/2012 by Dr. Magda Bernheim and status post left great toe amputation by Dr. Kenyon Ana. The patient needs transfer back to his surgeons for needed amputation of his toes, but more likely he would need a BKA. We'll continue vancomycin, clindamycin, and Cipro started. He is afebrile and his white blood cell count  has improved a little, but ultimately, the treatment is surgical intervention to avoid life-threatening sepsis. Blood cultures are negative x1 day. We'll keep the staples in place until he is transferred.  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 His blood glucose is better, but not optimal. Will adjust Lantus and sliding-scale NovoLog accordingly. His hemoglobin A1c is 9.5.  Hyponatremia. This is likely secondary to hypovolemia and hyperglycemia. We'll continue normal saline for hydration.  Normocytic/borderline microcytic anemia. The etiology is likely multifactorial including acute infection, chronic disease, and probable perioperative blood loss from previous surgery. Decrease in his hemoglobin from yesterday is likely dilutional in origin. Anemia panel pending. His TSH is within normal limits.  Seizure disorder. Per records reviewed, the patient is supposed to be on Keppra. This will be restarted.  Hyperlipidemia. Will restart statin.  Hypertension. Will restart losartan and later metoprolol if  needed.   Plan:  1. Will try to arrange transfer of the patient back to his surgeons at the [facility] in [place]. 2. Continue dressing changes, antibiotics, and supportive treatment. 3. Will restart his antiseizure medication, antihypertensive medication, and statin. 4. Check the results of the anemia panel pending.    LOS: 1 day   Morgane Joerger 04/12/2012, 10:40 AM

## 2012-04-12 NOTE — Progress Notes (Signed)
Pt received bed at [facility] on the floor 7A. Report was called to nurse on 7A Daniel. EMS arrived at 2220 to get patient. VS stable 138/80 75 20, afebrile. Pt has new IV in place to Right hand upon transfer. Called [facility] again to update on patient inform them that he was on the way.

## 2012-04-12 NOTE — Progress Notes (Signed)
[facility] Sher[facility] Mustache [facility] [place], is on [facility], and pt. Is still waiting on bed, pt. Notified also.

## 2012-04-12 NOTE — Care Management Note (Signed)
    Page 1 of 1   04/12/2012     3:21:47 PM   CARE MANAGEMENT NOTE 04/12/2012  Patient:  Ian Garcia, Ian Garcia   Account Number:  [id number]  Date Initiated:  04/12/2012  Documentation initiated by:  Rosemary Holms  Subjective/Objective Assessment:   Pt admitted from home following surgery on his foot from the [place] in [place]. In process of transfering to the [place]. awaiting bed control to transfer.     Action/Plan:   Anticipated DC Date:  04/12/2012   Anticipated DC Plan:  ACUTE TO ACUTE TRANS      DC Planning Services  CM consult      Choice offered to / List presented to:             Status of service:  Completed, signed off Medicare Important Message given?   (If response is "NO", the following Medicare IM given date fields will be blank) Date Medicare IM given:   Date Additional Medicare IM given:    Discharge Disposition:  ACUTE TO ACUTE TRANS  Per UR Regulation:    If discussed at Long Length of Stay Meetings, dates discussed:    Comments:  04/12/12 Rosemary Holms RN BSN CM Bonita Quin at [facility] handling pt transfer. to be in touch with nursing staff once bed control alerts her.

## 2012-04-12 NOTE — Discharge Summary (Signed)
Physician Discharge Summary  ODIN MARIANI ZOX:[id number] DOB: Nov 24, 1955 DOA: 04/11/2012  PCP: No primary provider on file.  Admit date: 04/11/2012 Discharge date: 04/12/2012  Time spent: Greater than 30  minutes  Recommendations for Outpatient Follow-up:  1. The patient is being transferred to the [facility] in [place]. Dr. Maye Hides has agreed to accept the patient in transfer.  Discharge Diagnoses:   1. Gas gangrene of the left foot, particularly the second and third metatarsal phalangeal joints; osteomyelitis of the second and third metatarsal phalangeal joints; cellulitis and abscess of the left foot; necrotic base of the amputation site of the left great toe. 2. 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 Hemoglobin A1c was 9.5. 3. Anemia, normocytic/borderline microcytic. Hemoglobin 10.7 on admission and 9.4 at the time of discharge, likely dilutional in nature. 4. Hyponatremia, secondary to hyperglycemia and hypovolemia. 5. Seizure disorder. 6. Hypertension. 7. Hyperlipidemia.    Discharge Condition: Stable.  Diet recommendation: Carbohydrate modified.  Filed Weights   04/11/12 0855 04/11/12 1748 04/12/12 0341  Weight: 82.101 kg (181 lb) 82.101 kg (181 lb) 77.7 kg (171 lb 4.8 oz)    History of present illness:    Ian Garcia is a 57 y.o. male with a history significant for diabetes mellitus, peripheral vascular disease, and seizure disorder. He was recently seen in the emergency department at [facility] for a left great toe infection. He was subsequently transferred to the [place] in [place]. Per history, the patient underwent a left great toe amputation. He returned with a chief complaint of left foot swelling, drainage, and some pain. He does not recall the names of his surgeons at the [place], but apparently, he underwent vascular bypass of his left leg vessels. He stated that he was hospitalized for 2 weeks at the [facility]. He  followed up with his surgeon and his primary care physician, but he says that no one look at his wound site. He still has staples intact in the vascular procedure site, but they were supposed to be taken out one to 2 weeks ago. He denied having had home health nursing for wound care. He said that his mother and another family member have been soaking his foot and cleansing his foot as they did during his hospitalization. They noticed that the foot was becoming more red, more swollen, and the amputation site had become black. The area began to drain pus which was very malodorous. He had some mild tenderness of the plantar surface of his left foot, only when it is palpated, but no rest pain. He denied associated fever, chills, nausea, vomiting, or diarrhea. One week ago, he stopped taking all of his medications ( of which he cannot recall the names) because it caused him to have a poor appetite.  In the emergency department, the patient was afebrile and hemodynamically stable. His lab data were significant for WBC of 13.0, hemoglobin of 10.7, sodium of 130, and glucose of 346. X-ray of his left foot revealed a large amount of soft tissue gas seen around the second and third metatarsal phalangeal joints, cellulitis, and osteomyelitis involving the proximal portions of the second and third proximal phalanges. He was admitted for further evaluation and management.   Hospital Course:  The patient was started on vancomycin in the emergency department. Blood cultures were ordered. Emergency department PA[facility], but there were no beds available. Antibiotic therapy was  broadened with clindamycin and Cipro. Vancomycin was continued. He was started on normal saline phlebotomy repletion and for treatment of hyponatremia. Sliding scale NovoLog and Lantus were started for treatment of his diabetes. The nursing staff was instructed to provide wound care to  absorb the malodorous drainage twice a day. Records from the [facility] hospitalization were requested. They were reviewed. From the review, some of his chronic medications were restarted.  For further evaluation, additional studies were ordered. His followup hemoglobin fell slightly to 9.4, which was thought to be dilutional in nature. His serum sodium improved to 131. His WBC improved to 11.6. His hemoglobin A1c was 9.5. His TSH was within normal limits at 1.4. As at the time of this dictation, his blood cultures have remained negative. The results of the anemia panel are pending.  I called his vascular surgeon, Dr. Maye Hides at the [facility]. I discussed the findings with him. He agreed to accept the patient back in transfer for further management. This was discussed with the patient and his mother who wholeheartedly agreed.  Currently, the patient is afebrile and hemodynamically stable. He does not appear to be toxic-appearing, but he is certainly at risk for decompensating. It is likely he will need at least a left BKA and possibly an AKA. We are awaiting an available bed for the patient be transferred to the [facility].   Procedures:  None  Consultations:  None  Discharge Exam: Filed Vitals:   04/11/12 2143 04/12/12 0341 04/12/12 0510 04/12/12 1103  BP: 135/70  149/74 143/70  Pulse: 74  77 69  Temp: 99.7 F (37.6 C)  98.3 F (36.8 C)   TempSrc: Oral  Oral   Resp: 20  18 20   Height:      Weight:  77.7 kg (171 lb 4.8 oz)    SpO2: 97%  94%    Exam: Unchanged from exam dictated earlier on the progress note.  Discharge Instructions     Medication List   Scheduled:  .  ciprofloxacin  400 mg  Intravenous  Q12H   .  clindamycin (CLEOCIN) IV  600 mg  Intravenous  Q8H   .  insulin aspart  0-20 Units  Subcutaneous  TID WC   .  insulin aspart  0-5 Units  Subcutaneous  QHS   .  insulin glargine  15 Units  Subcutaneous  QHS   .  levETIRAcetam  750 mg  Oral  QHS   .   losartan  25 mg  Oral  Daily   .  pantoprazole  40 mg  Oral  Daily   .  vancomycin  1,000 mg  Intravenous  Q12H    Continuous:  .  0.9 % NaCl with KCl 20 mEq / L  50 mL/hr at 04/12/12 1610    RUE:[id number]    The results of significant diagnostics from this hospitalization (including imaging, microbiology, ancillary and laboratory) are listed below for reference.    Significant Diagnostic Studies: Dg Foot Complete Left  04/11/2012  *RADIOLOGY REPORT*  Clinical Data: Foot pain and swelling.  LEFT FOOT - COMPLETE 3+ VIEW  Comparison: March 03, 2012.  Findings: Status post amputation of left big toe.  There does appear to be lytic destruction involving the distal portion of the first metatarsal as well as involving portions of the second and third proximal phalanges.  A large amount of soft tissue gas is seen around the second and third metatarsophalangeal joints.  IMPRESSION: Findings consistent with  cellulitis around the second and third metatarsophalangeal joints as well as osteomyelitis involving the proximal portions of the second and third proximal phalanges.   Original Report Authenticated By: Lupita Raider.,  M.D.     Microbiology: Recent Results (from the past 240 hour(s))  CULTURE, BLOOD (ROUTINE X 2)     Status: Normal (Preliminary result)   Collection Time   04/11/12 10:21 AM      Component Value Range Status Comment   Specimen Description BLOOD RIGHT HAND   Final    Special Requests BOTTLES DRAWN AEROBIC AND ANAEROBIC 6CC   Final    Culture NO GROWTH 1 DAY   Final    Report Status PENDING   Incomplete   CULTURE, BLOOD (ROUTINE X 2)     Status: Normal (Preliminary result)   Collection Time   04/11/12 10:22 AM      Component Value Range Status Comment   Specimen Description BLOOD RIGHT ANTECUBITAL DRAWN BY RN GM   Final    Special Requests BOTTLES DRAWN AEROBIC AND ANAEROBIC 5CC   Final    Culture NO GROWTH 1 DAY   Final    Report Status PENDING   Incomplete       Labs: Basic Metabolic Panel:  Lab 04/12/12 1324 04/11/12 1011  NA 131* 130*  K 4.2 4.3  CL 96 92*  CO2 26 29  GLUCOSE 263* 346*  BUN 11 15  CREATININE 1.08 1.19  CALCIUM 8.8 9.3  MG -- --  PHOS -- --   Liver Function Tests:  Lab 04/12/12 0533 04/11/12 1011  AST 13 11  ALT 13 13  ALKPHOS 79 91  BILITOT 0.4 0.4  PROT 6.8 7.7  ALBUMIN 2.2* 2.5*   No results found for this basename: LIPASE:5,AMYLASE:5 in the last 168 hours No results found for this basename: AMMONIA:5 in the last 168 hours CBC:  Lab 04/12/12 0533 04/11/12 1011  WBC 11.6* 13.0*  NEUTROABS -- 10.7*  HGB 9.4* 10.7*  HCT 28.7* 32.2*  MCV 79.9 80.3  PLT 454* 462*   Cardiac Enzymes: No results found for this basename: CKTOTAL:5,CKMB:5,CKMBINDEX:5,TROPONINI:5 in the last 168 hours BNP: BNP (last 3 results) No results found for this basename: PROBNP:3 in the last 8760 hours CBG:  Lab 04/12/12 1118 04/12/12 0801 04/11/12 2145 04/11/12 1753 04/11/12 1336  GLUCAP 270* 248* 152* 200* 283*       Signed:  Chasten Blaze  [facility] 04/12/2012, 2:11 PM

## 2012-04-13 LAB — GLUCOSE, CAPILLARY: Glucose-Capillary: 143 mg/dL — ABNORMAL HIGH (ref 70–99)

## 2012-04-17 LAB — CULTURE, BLOOD (ROUTINE X 2): Culture: NO GROWTH

## 2013-01-25 ENCOUNTER — Ambulatory Visit (HOSPITAL_COMMUNITY): Payer: Non-veteran care | Admitting: Physical Therapy

## 2013-02-09 ENCOUNTER — Ambulatory Visit (HOSPITAL_COMMUNITY)
Admission: RE | Admit: 2013-02-09 | Discharge: 2013-02-09 | Disposition: A | Discharge status: Home / Self Care | Payer: Non-veteran care | Source: Ambulatory Visit | Attending: Student | Admitting: Student

## 2013-02-09 DIAGNOSIS — S78119A Complete traumatic amputation at level between unspecified hip and knee, initial encounter: Secondary | ICD-10-CM | POA: Insufficient documentation

## 2013-02-09 DIAGNOSIS — R269 Unspecified abnormalities of gait and mobility: Secondary | ICD-10-CM | POA: Insufficient documentation

## 2013-02-09 DIAGNOSIS — R262 Difficulty in walking, not elsewhere classified: Secondary | ICD-10-CM | POA: Insufficient documentation

## 2013-02-09 DIAGNOSIS — E119 Type 2 diabetes mellitus without complications: Secondary | ICD-10-CM | POA: Insufficient documentation

## 2013-02-09 DIAGNOSIS — IMO0001 Reserved for inherently not codable concepts without codable children: Secondary | ICD-10-CM | POA: Insufficient documentation

## 2013-02-09 NOTE — Evaluation (Signed)
Physical Therapy Evaluation  Patient Details  Name: Ian Garcia MRN: [id number] Date of Birth: Feb 10, 1956  Today's Date: 02/09/2013 Time: 1110-1145 PT Time Calculation (min): 35 min Charges: 1 evalatuion             Visit#: 1 of 12  Re-eval: 03/08/13 Assessment Diagnosis: Ian Garcia Next MD Visit: Dr. Kipp Laurence Phone: [phone]; Fax: [phone] Prior Therapy: since December 2013-July 2014 at the [facility]  Authorization: [place]    Authorization Time Period: approved 12 vistis until 03/08/13  Authorization Visit#: 1 of 10   Past Medical History:  Past Medical History  Diagnosis Date  . Diabetes mellitus   . Diabetic neuropathy   . Seizures   . Hypertension   . Acid reflux   . Hypercholesteremia   . Peripheral vascular disease due to secondary diabetes mellitus   . CVA (cerebral infarction)   . Carotid stenosis     s/p CEA on the left  . Glaucoma    Past Surgical History:  Past Surgical History  Procedure Laterality Date  . Eye surgery    . Left femoral to tibial bypass  03/16/[facility]  . Amputation left great toe  02/2012    [facility]  . 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  He reports that it started with a sore on his foot back in November 2013 and led to a BKA on 03/03/12 and went back in in April 2014 had his AKA which started as a sore on his great toe.  He was in and out of [facility] [place]  rehab from Dec 2013-July 2014.  Pt reports that he was never a fast walker even before he had an amputation. He has had his prothetic for about 3 months.  "I've lost my motivation to walk at home."  Pt reports that he is now motivated to walk in rehab.   Pt reports his biggest prolem is walking on uneven surface.  he would like to try walk with crutches Patient Stated Goals: I want to be able to walk smooth.  Pain Assessment Currently in Pain?:  No/denies  Precautions/Restrictions   Fall  Balance Screening Balance Screen Has the patient fallen in the past 6 months: Yes How many times?: 3 (outdoors) Has the patient had a decrease in activity level because of a fear of falling? : Yes Is the patient reluctant to leave their home because of a fear of falling? : No  Prior Functioning  Prior Function Level of Independence: Requires assistive device for independence Vocation: On disability Comments: playing computer games, walk in the community  Cognition/Observation Observation/Other Assessments Observations: unable to go from sit to stand without assistive device, requires use of 2 hands and would pull up on RW.   Sensation/Coordination/Flexibility/Functional Tests Functional Tests Functional Tests: FOTO: 52/48 Functional Tests: 2 minute walk test: 41 ft  Assessment RLE Strength Right Hip Flexion: 5/5 Right Hip Extension: 5/5 Right Hip ABduction: 5/5 Right Hip ADduction: 5/5 Right Knee Flexion: 5/5 Right Knee Extension: 5/5 LLE Strength Left Hip Flexion: 5/5 Left Hip Extension: 4/5 Left Hip ABduction: 3/5 Left Hip ADduction: 3/5  Mobility/Balance  Ambulation/Gait Ambulation/Gait: Yes Assistive device: Rolling walker Gait Pattern: Decreased stride length;Decreased hip/knee flexion - left;Trunk flexed Gait velocity: 0.32 ft/sec Berg Balance Test Sit to Stand: Needs minimal aid to stand or to stabilize  Standing Unsupported: Unable to stand 30 seconds unassisted Sitting with Back Unsupported but Feet Supported on Floor or Stool: Able to sit safely and securely 2 minutes Stand to Sit: Uses backs of legs against chair to control descent Transfers: Needs one person to assist Standing Unsupported with Eyes Closed: Unable to keep eyes closed 3 seconds but stays steady Standing Ubsupported with Feet Together: Needs help to attain position but able to stand for 30 seconds with feet together From Standing, Reach Forward  with Outstretched Arm: Reaches forward but needs supervision From Standing Position, Pick up Object from Floor: Unable to try/needs assist to keep balance From Standing Position, Turn to Look Behind Over each Shoulder: Needs supervision when turning Turn 360 Degrees: Needs assistance while turning Standing Unsupported, Alternately Place Feet on Step/Stool: Needs assistance to keep from falling or unable to try Standing Unsupported, One Foot in Front: Loses balance while stepping or standing Standing on One Leg: Unable to try or needs assist to prevent fall Total Score: 12   Exercise/Treatments Sidelying Hip ABduction: Left;10 reps;Limitations Hip ABduction Limitations: w/leg on   Physical Therapy Assessment and Plan PT Assessment and Plan Clinical Impression Statement: Pt is a 57 year old male referred to PT for gait training for Lt AKA.  At this time pt has significant impairments with balance and gait mechanics due to lack of knowlege and coordinated movements with his prosthetic device.   Pt will benefit from skilled therapeutic intervention in order to improve on the following deficits: Abnormal gait;Decreased balance;Difficulty walking;Decreased strength PT Frequency: Min 3X/week PT Duration: 4 weeks PT Treatment/Interventions: Therapeutic exercise (Prosthetic training) PT Plan: ONLY CHARGE TE OR PROTHETIC TRAINING.  BWS TM training, Continue to focus on improving static stance, seated dynamic balance without LE support to work on weight shifting, standing weight shifting, progress towards exercises to improve BERG.     Goals Home Exercise Program Pt/caregiver will Perform Home Exercise Program: Independently PT Short Term Goals Time to Complete Short Term Goals: 2 weeks PT Short Term Goal 1: Pt will improve proprioceptive awareness and demonstrate narrow BOS stance x1 minute with supervision to decrease risk of falls.  PT Short Term Goal 2: Pt will improve gait velocity to 1.0  ft/sec with LRAD and supervision to decrease falls risk.  PT Short Term Goal 3: Pt wil improve his gait mechanics in order to complete the dynamic gait index (DGI). PT Short Term Goal 4: Pt will improve functional strength and go from sit to stand from standard surface with BUE support without need of AD.  PT Long Term Goals Time to Complete Long Term Goals: 4 weeks PT Long Term Goal 1: Pt will improve his Berg to 32/56 to decrease risk of falls.  PT Long Term Goal 2: Pt will improve his DGI to 12/24 to decrease risk of falls in community.  Long Term Goal 3: Pt will improve his gait speed to 1.75 ft/sec with indoor and outdoor ambulation with mod I to improve independene in home and commiunity Long Term Goal 4: Pt will improve his FOTO to limiation less than 39% for improved QOL.   Problem List Patient Active Problem List   Diagnosis Date Noted  . Difficulty in walking(719.7) 02/09/2013  . Gas gangrene of lower extremity 04/11/2012  . Cellulitis and abscess of foot 04/11/2012  . Osteomyelitis of metatarsal 04/11/2012  . Type II diabetes mellitus with complication, uncontrolled 04/11/2012  . Hyponatremia 04/11/2012  . Anemia 04/11/2012  . Peripheral vascular disease due to  secondary diabetes mellitus 04/11/2012  . Seizure disorder 04/11/2012    PT Plan of Care PT Home Exercise Plan: not given due to balance concerns.  PT Patient Instructions: importance of hip abduction for stabilization.  Consulted and Agree with Plan of Care: Patient  GP Functional Limitation: Mobility: Walking and moving around Mobility: Walking and Moving Around Current Status [phone]): At least 40 percent but less than 60 percent impaired, limited or restricted Mobility: Walking and Moving Around Goal Status [phone]): At least 20 percent but less than 40 percent impaired, limited or restricted  Emili Mcloughlin, MPT, ATC 02/09/2013, 3:20 PM  Physician Documentation Your signature is required to indicate approval of  the treatment plan as stated above.  Please sign and either send electronically or make a copy of this report for your files and return this physician signed original.   Please mark one 1.__approve of plan  2. ___approve of plan with the following conditions.   ______________________________                                                          _____________________ Physician Signature                                                                                                             Date

## 2013-02-13 ENCOUNTER — Ambulatory Visit (HOSPITAL_COMMUNITY)
Admission: RE | Admit: 2013-02-13 | Discharge: 2013-02-13 | Disposition: A | Discharge status: Home / Self Care | Payer: Non-veteran care | Source: Ambulatory Visit | Attending: *Deleted | Admitting: *Deleted

## 2013-02-13 DIAGNOSIS — R262 Difficulty in walking, not elsewhere classified: Secondary | ICD-10-CM

## 2013-02-13 NOTE — Progress Notes (Signed)
Physical Therapy Treatment Patient Details  Name: Ian Garcia MRN: [id number] Date of Birth: [birth year]  Today's Date: 02/13/2013 Time: 6213-0865 PT Time Calculation (min): 54 min Charge: TE [id number]  Visit#: 2 of 12  Re-eval: 03/08/13 Assessment Diagnosis: [name] Next MD Visit: Dr. Kipp Laurence Phone: [phone]; Fax: [phone] Prior Therapy: since December 2013-July 2014 at the [facility]  Authorization: [place]  Authorization Time Period: approved 12 vistis until 03/08/13  Authorization Visit#: 2 of 10   Subjective: Symptoms/Limitations Symptoms: Pt reported some pain groin region from prothetic limb, currently pain free Pain Assessment Currently in Pain?: No/denies  Objective:  Exercise/Treatments Aerobic Tread Mill: BWS on TM 2sets 19minutes/10 minutes @ .5-->.18mph;  vc-ing for equal stance phase and stride length. Standing Other Standing Knee Exercises: static standing no AD head movements and 10x shoulder flexion; R/Land A/P weight shifting      Physical Therapy Assessment and Plan PT Assessment and Plan Clinical Impression Statement: Began POC for Lt AKA to improve confidence with static standing and weight shifting to improve gait mechanics.  Pt required PT facilitation to equalize weight distribution with verbal and visual cueing.  Multimodal cueing for proper sequencing with gait on TM, improved at end of session. PT Plan: ONLY CHARGE TE OR PROTHETIC TRAINING.  BWS TM training, Continue to focus on improving static stance, seated dynamic balance without LE support to work on weight shifting, standing weight shifting, progress towards exercises to improve BERG.     Goals Home Exercise Program Pt/caregiver will Perform Home Exercise Program: Independently PT Short Term Goals Time to Complete Short Term Goals: 2 weeks PT Short Term Goal 1: Pt will improve proprioceptive awareness and demonstrate narrow BOS stance x1 minute with supervision to decrease risk of falls.   PT Short Term Goal 1 - Progress: Progressing toward goal PT Short Term Goal 2: Pt will improve gait velocity to 1.0 ft/sec with LRAD and supervision to decrease falls risk.  PT Short Term Goal 2 - Progress: Progressing toward goal PT Short Term Goal 3: Pt wil improve his gait mechanics in order to complete the dynamic gait index (DGI). PT Short Term Goal 4: Pt will improve functional strength and go from sit to stand from standard surface with BUE support without need of AD.  PT Long Term Goals Time to Complete Long Term Goals: 4 weeks PT Long Term Goal 1: Pt will improve his Berg to 32/56 to decrease risk of falls.  PT Long Term Goal 2: Pt will improve his DGI to 12/24 to decrease risk of falls in community.  Long Term Goal 3: Pt will improve his gait speed to 1.75 ft/sec with indoor and outdoor ambulation with mod I to improve independene in home and commiunity Long Term Goal 4: Pt will improve his FOTO to limiation less than 39% for improved QOL.   Problem List Patient Active Problem List   Diagnosis Date Noted  . Difficulty in walking(719.7) 02/09/2013  . Gas gangrene of lower extremity 04/11/2012  . Cellulitis and abscess of foot 04/11/2012  . Osteomyelitis of metatarsal 04/11/2012  . Type II diabetes mellitus with complication, uncontrolled 04/11/2012  . Hyponatremia 04/11/2012  . Anemia 04/11/2012  . Peripheral vascular disease due to secondary diabetes mellitus 04/11/2012  . Seizure disorder 04/11/2012    PT - End of Session Equipment Utilized During Treatment: Gait belt Activity Tolerance: Patient tolerated treatment well;Patient limited by fatigue General Behavior During Therapy: [facility] for tasks assessed/performed  GP    [name]  02/13/2013, 4:33 PM

## 2013-02-14 ENCOUNTER — Ambulatory Visit (HOSPITAL_COMMUNITY)
Admission: RE | Admit: 2013-02-14 | Discharge: 2013-02-14 | Disposition: A | Discharge status: Home / Self Care | Payer: Non-veteran care | Source: Ambulatory Visit | Attending: *Deleted | Admitting: *Deleted

## 2013-02-14 DIAGNOSIS — R262 Difficulty in walking, not elsewhere classified: Secondary | ICD-10-CM

## 2013-02-14 NOTE — Progress Notes (Signed)
Physical Therapy Treatment Patient Details  Name: Ian Garcia MRN: [id number] Date of Birth: Nov 17, 1955  Today's Date: 02/14/2013 Time: 1102-1210 PT Time Calculation (min): 68 min Charge: TE [id number]  Visit#: 3 of 12  Re-eval: 03/08/13    Authorization[place]  Authorization Time Period: approved 12 vistis until 03/08/13  Authorization Visit#: 3 of 10   Subjective: Symptoms/Limitations Symptoms: Pt reported pain free today, stated he was tired following last session.  No irritation to limb. Pain Assessment Currently in Pain?: No/denies  Objective:  Exercise/Treatments Aerobic Tread Mill: BWS on TM 2sets 25minutes= 10 minutes @ . ;  vc-ing for equal stance phase and stride length. Standing Other Standing Knee Exercises: static standing np AD 2 minutes Seated Other Seated Knee Exercises: Dynadisc R/L, A/P, shoulder flexion no LE contact  Other Seated Knee Exercises: Sit to stand 4reps  Balance Exercises Standing Standing Eyes Opened: Wide (BOA);Limitations Standing Eyes Opened Limitations: static standing no AD head movements and 10x shoulder flexion; R/Land A/P weight shifting Other Standing Exercises: weight shifting R/L, A/P, hula hoop 10x to improve hip mobiltiy Other Standing Exercises: Rt foot on 4in step to increase Lt weight bearing.     Physical Therapy Assessment and Plan PT Assessment and Plan Clinical Impression Statement: Session focus on improving weight shifting with PT facilitation required with sitting and standing to increase weight bearing to Lt LE.  Multimodal cueing required with gait training on TM to improve foot placement, knee flexion/extension, posture and stride length/stance phase.  Pt limited by fatigue, no reports of pain.  Pt encouraged to examine limb following all weight bearing to assess any irritation PT Plan: ONLY CHARGE TE OR PROTHETIC TRAINING.  BWS TM training, Continue to focus on improving static stance, seated dynamic balance  without LE support to work on weight shifting, standing weight shifting, progress towards exercises to improve BERG.     Goals Home Exercise Program Pt/caregiver will Perform Home Exercise Program: Independently PT Short Term Goals Time to Complete Short Term Goals: 2 weeks PT Short Term Goal 1: Pt will improve proprioceptive awareness and demonstrate narrow BOS stance x1 minute with supervision to decrease risk of falls.  PT Short Term Goal 1 - Progress: Progressing toward goal PT Short Term Goal 2: Pt will improve gait velocity to 1.0 ft/sec with LRAD and supervision to decrease falls risk.  PT Short Term Goal 2 - Progress: Progressing toward goal PT Short Term Goal 3: Pt wil improve his gait mechanics in order to complete the dynamic gait index (DGI). PT Short Term Goal 4: Pt will improve functional strength and go from sit to stand from standard surface with BUE support without need of AD.  PT Short Term Goal 4 - Progress: Progressing toward goal PT Long Term Goals Time to Complete Long Term Goals: 4 weeks PT Long Term Goal 1: Pt will improve his Berg to 32/56 to decrease risk of falls.  PT Long Term Goal 2: Pt will improve his DGI to 12/24 to decrease risk of falls in community.  Long Term Goal 3: Pt will improve his gait speed to 1.75 ft/sec with indoor and outdoor ambulation with mod I to improve independene in home and commiunity Long Term Goal 4: Pt will improve his FOTO to limiation less than 39% for improved QOL.   Problem List Patient Active Problem List   Diagnosis Date Noted  . Difficulty in walking(719.7) 02/09/2013  . Gas gangrene of lower extremity 04/11/2012  . Cellulitis and abscess of foot 04/11/2012  .  Osteomyelitis of metatarsal 04/11/2012  . Type II diabetes mellitus with complication, uncontrolled 04/11/2012  . Hyponatremia 04/11/2012  . Anemia 04/11/2012  . Peripheral vascular disease due to secondary diabetes mellitus 04/11/2012  . Seizure disorder 04/11/2012     PT - End of Session Equipment Utilized During Treatment: Gait belt Activity Tolerance: Patient tolerated treatment well;Patient limited by fatigue General Behavior During Therapy: [facility] for tasks assessed/performed  GP    [name] 02/14/2013, 2:13 PM

## 2013-02-17 ENCOUNTER — Ambulatory Visit (HOSPITAL_COMMUNITY)
Admission: RE | Admit: 2013-02-17 | Discharge: 2013-02-17 | Disposition: A | Discharge status: Home / Self Care | Payer: Non-veteran care | Source: Ambulatory Visit | Attending: Student | Admitting: Student

## 2013-02-17 DIAGNOSIS — R262 Difficulty in walking, not elsewhere classified: Secondary | ICD-10-CM

## 2013-02-17 NOTE — Progress Notes (Signed)
Physical Therapy Treatment Patient Details  Name: Ian Garcia MRN: [id number] Date of Birth: 08/02/1955  Today's Date: 02/17/2013 Time: 1105-1150 PT Time Calculation (min): 45 min Charges: TE: 1105-1150 Visit#: 4 of 12  Re-eval: 03/08/13    Authorization[place]  Authorization Time Period: approved 12 vistis until 03/08/13  Authorization Visit#: 3 of 10   Subjective: Symptoms/Limitations Symptoms: Pt reports that he liked the TM when walking with the mirror  Precautions/Restrictions     Exercise/Treatments Aerobic Tread Mill: BWS on TM Gait trainer x12 minutes total (3 standing rest breaks w/balance exercise): w/visual, verbal and tactile cueing for stride.  0.15 cy/sec- 0.25 cy/sec Standing Gait Training: w/RW and verbal and tactile cueing to improve gait mechanics x100 feet Standing Standing, One Foot on a Step: Eyes open;4 inch;2 reps;30 secs;1 rep;Time;Limitations Standing, One Foot on a Step Time: 1 rep 60 sec.  COmpleted BLE Standing, One Foot on a Step Limitations: in BWS system w/o UE A, only 1 UE assit to maintain position Other Standing Exercises: weight shifting  in BWS TM w/o UE A: R/L, A/P x2 minutes, hula hoop 2 minutes to improve hip mobiltiy, Figure 8 x2 minutes   Physical Therapy Assessment and Plan PT Assessment and Plan Clinical Impression Statement: Continued with BWS TM system to improve stride, pt is able to complete initally 6:30 with intermittent improvement in stride, needed to stop TM system due to max A secondary to fatigue.  Then able to make another 2:30 and then finish with 3 minutes on TM.  During segment standing breaks to work on balance activities without UE A and in BSW TM to improve confidence and decrease need for UE A.  Pt has improved posture with balance on RLE and requires signfiicant visual, verbal and TC to improve posture with LLE weight bearing.  End of session gait training with RW in open environment and requires cueing for step  through pattern into RW.  PT Plan: ONLY CHARGE TE OR PROTHETIC TRAINING. Gluteal strengthening,  Continue with  BWS TM training, Continue to focus on improving static stance, seated dynamic balance without LE support to work on weight shifting, standing weight shifting, progress towards exercises to improve BERG.     Goals    Problem List Patient Active Problem List   Diagnosis Date Noted  . Difficulty in walking(719.7) 02/09/2013  . Gas gangrene of lower extremity 04/11/2012  . Cellulitis and abscess of foot 04/11/2012  . Osteomyelitis of metatarsal 04/11/2012  . Type II diabetes mellitus with complication, uncontrolled 04/11/2012  . Hyponatremia 04/11/2012  . Anemia 04/11/2012  . Peripheral vascular disease due to secondary diabetes mellitus 04/11/2012  . Seizure disorder 04/11/2012    PT - End of Session Equipment Utilized During Treatment: Gait belt Activity Tolerance: Patient tolerated treatment well;Patient limited by fatigue General Behavior During Therapy: [facility] for tasks assessed/performed  GP    Elizabethanne Lusher, MPT, ATC 02/17/2013, 12:05 PM

## 2013-02-22 ENCOUNTER — Ambulatory Visit (HOSPITAL_COMMUNITY)
Admission: RE | Admit: 2013-02-22 | Discharge: 2013-02-22 | Disposition: A | Discharge status: Home / Self Care | Payer: Non-veteran care | Source: Ambulatory Visit | Attending: *Deleted | Admitting: *Deleted

## 2013-02-22 DIAGNOSIS — R262 Difficulty in walking, not elsewhere classified: Secondary | ICD-10-CM

## 2013-02-22 NOTE — Progress Notes (Signed)
Physical Therapy Treatment Patient Details  Name: Ian Garcia MRN: [id number] Date of Birth: 1955-06-13  Today's Date: 02/22/2013 Time: 1020-1103 PT Time Calculation (min): 43 min Charge:Prothetic training 1020-1030, TE 1030-1103  Visit#: 5 of 12  Re-eval: 03/08/13 Assessment Diagnosis: [name] Next MD Visit: Dr. Kipp Laurence Phone: [phone]; Fax: [phone] Prior Therapy: since December 2013-July 2014 at the [facility]  Authorization: [place]  Authorization Time Period: approved 12 vistis until 03/08/13  Authorization Visit#: 4 of 10   Subjective: Symptoms/Limitations Symptoms: Pt entered dept with prostetic limb in ER, reports no pain.  Pt feels his walking is improving with the TM. Pain Assessment Currently in Pain?: No/denies  Objective   Exercise/Treatments Aerobic Tread Mill: BWS on TM Gait trainer x14 minutes total (1 rest break after 8 min gait to improve hip mobiltiy exercises then resumed gait training) Standing Gait Training: w/RW and verbal and tactile cueing to improve gait mechanics x258 Supine Other Supine Knee Exercises: Prosthetic limb fitting  Balance Exercises Standing Standing, One Foot on a Step: Eyes open;4 inch;2 reps;30 secs;1 rep;Time;Limitations Standing, One Foot on a Step Time: 1 rep 60 sec.  COmpleted BLE Standing, One Foot on a Step Limitations: in BWS system w/o UE A, only 1 UE assit to maintain position Other Standing Exercises: weight shifting  in BWS TM w/o UE A: R/L, A/P x2 minutes, hula hoop 2 minutes to improve hip mobiltiy, Figure 8 x2 minutes   Physical Therapy Assessment and Plan PT Assessment and Plan Clinical Impression Statement: Began session with prothetic limb training to assure limb in proper alignment prior gait, pt able to don/doffing limb independentely with min assistance required for proper alignment. Continued with BWS gait training on TM to improve stride length and mechanics with min-mod multimodal cueing required to  improve stride, posture and improve hip mobility.  Pt limited by fatigue at end of session, no reports of pain through session. PT Plan: ONLY CHARGE TE OR PROTHETIC TRAINING. Gluteal strengthening,  Continue with  BWS TM training, Continue to focus on improving static stance, seated dynamic balance without LE support to work on weight shifting, standing weight shifting, progress towards exercises to improve BERG.     Goals Home Exercise Program Pt/caregiver will Perform Home Exercise Program: Independently PT Short Term Goals Time to Complete Short Term Goals: 2 weeks PT Short Term Goal 1: Pt will improve proprioceptive awareness and demonstrate narrow BOS stance x1 minute with supervision to decrease risk of falls.  PT Short Term Goal 1 - Progress: Progressing toward goal PT Short Term Goal 2: Pt will improve gait velocity to 1.0 ft/sec with LRAD and supervision to decrease falls risk.  PT Short Term Goal 2 - Progress: Progressing toward goal PT Short Term Goal 3: Pt wil improve his gait mechanics in order to complete the dynamic gait index (DGI). PT Short Term Goal 3 - Progress: Progressing toward goal PT Short Term Goal 4: Pt will improve functional strength and go from sit to stand from standard surface with BUE support without need of AD.  PT Long Term Goals Time to Complete Long Term Goals: 4 weeks PT Long Term Goal 1: Pt will improve his Berg to 32/56 to decrease risk of falls.  PT Long Term Goal 2: Pt will improve his DGI to 12/24 to decrease risk of falls in community.  Long Term Goal 3: Pt will improve his gait speed to 1.75 ft/sec with indoor and outdoor ambulation with mod I to improve independene in home  and commiunity Long Term Goal 4: Pt will improve his FOTO to limiation less than 39% for improved QOL.   Problem List Patient Active Problem List   Diagnosis Date Noted  . Difficulty in walking(719.7) 02/09/2013  . Gas gangrene of lower extremity 04/11/2012  . Cellulitis and  abscess of foot 04/11/2012  . Osteomyelitis of metatarsal 04/11/2012  . Type II diabetes mellitus with complication, uncontrolled 04/11/2012  . Hyponatremia 04/11/2012  . Anemia 04/11/2012  . Peripheral vascular disease due to secondary diabetes mellitus 04/11/2012  . Seizure disorder 04/11/2012       GP    [name] 02/22/2013, 12:27 PM

## 2013-02-23 ENCOUNTER — Ambulatory Visit (HOSPITAL_COMMUNITY)
Admission: RE | Admit: 2013-02-23 | Discharge: 2013-02-23 | Disposition: A | Discharge status: Home / Self Care | Payer: Non-veteran care | Source: Ambulatory Visit | Attending: Student | Admitting: Student

## 2013-02-23 DIAGNOSIS — R262 Difficulty in walking, not elsewhere classified: Secondary | ICD-10-CM

## 2013-02-23 NOTE — Progress Notes (Signed)
Physical Therapy Treatment Patient Details  Name: Ian Garcia MRN: [id number] Date of Birth: 1955/07/01  Today's Date: 02/23/2013 Time: 1300-1350 PT Time Calculation (min): 50 min Charge:TE 1300-1348  Visit#: 6 of 12  Re-eval: 03/08/13 Assessment Diagnosis: [name] Next MD Visit: Dr. Kipp Laurence Phone: [phone]; Fax: [phone] Prior Therapy: since December 2013-July 2014 at the [facility]  Authorization: [place]  Authorization Time Period: approved 12 vistis until 03/08/13  Authorization Visit#: 6 of 10   Subjective: Symptoms/Limitations Symptoms: Pain free today, reported tired following last session.  Checked limb following tx and stated no irritation Pain Assessment Currently in Pain?: No/denies  Objective:  Exercise/Treatments Aerobic Tread Mill: BWS on TM Gait trainer x15 minutes total, no rest breaks   Balance Exercises Standing Standing Eyes Opened: Wide (BOA);Narrow base of support (BOS);Limitations Standing Eyes Opened Limitations: static standing no AD head movements and 10x shoulder flexion; R/Land A/P weight shifting Standing, One Foot on a Step: Eyes open;4 inch;2 reps;30 secs;1 rep;Time;Limitations Standing, One Foot on a Step Time: 1 rep 60 sec.  COmpleted BLE Standing, One Foot on a Step Limitations: in BWS system w/o UE A, only 1 UE assit to maintain position Other Standing Exercises: weight shifting  in BWS TM w/o UE A: R/L, A/P x2 minutes, hula hoop 2 minutes to improve hip mobiltiy, Figure 8 x2 minutes Other Standing Exercises: Rt foot on 4in step to increase Lt weight bearing.     Seated Other Seated Exercises: 5 STS with Airex and min assistance   Physical Therapy Assessment and Plan PT Assessment and Plan Clinical Impression Statement: Session focus on improving weight shifting and balance to improve gait mechanics on TM.  Pt with improved stride length following cueing to equalize stride length.  Pt continues to decreased weight bearing on Lt LE  with gait, therapist facilitation to imporve equal stance.  Added sit to stand for functional strengthening with increased height on chair and min assistance.   PT Plan: ONLY CHARGE TE OR PROTHETIC TRAINING. Gluteal strengthening,  Continue with  BWS TM training, Continue to focus on improving static stance, seated dynamic balance without LE support to work on weight shifting, standing weight shifting, progress towards exercises to improve BERG.     Goals Home Exercise Program Pt/caregiver will Perform Home Exercise Program: Independently PT Short Term Goals Time to Complete Short Term Goals: 2 weeks PT Short Term Goal 1: Pt will improve proprioceptive awareness and demonstrate narrow BOS stance x1 minute with supervision to decrease risk of falls.  PT Short Term Goal 1 - Progress: Progressing toward goal PT Short Term Goal 2: Pt will improve gait velocity to 1.0 ft/sec with LRAD and supervision to decrease falls risk.  PT Short Term Goal 2 - Progress: Progressing toward goal PT Short Term Goal 3: Pt wil improve his gait mechanics in order to complete the dynamic gait index (DGI). PT Short Term Goal 3 - Progress: Progressing toward goal PT Short Term Goal 4: Pt will improve functional strength and go from sit to stand from standard surface with BUE support without need of AD.  PT Short Term Goal 4 - Progress: Progressing toward goal PT Long Term Goals Time to Complete Long Term Goals: 4 weeks PT Long Term Goal 1: Pt will improve his Berg to 32/56 to decrease risk of falls.  PT Long Term Goal 2: Pt will improve his DGI to 12/24 to decrease risk of falls in community.  Long Term Goal 3: Pt will improve his gait  speed to 1.75 ft/sec with indoor and outdoor ambulation with mod I to improve independene in home and commiunity Long Term Goal 4: Pt will improve his FOTO to limiation less than 39% for improved QOL.   Problem List Patient Active Problem List   Diagnosis Date Noted  . Difficulty in  walking(719.7) 02/09/2013  . Gas gangrene of lower extremity 04/11/2012  . Cellulitis and abscess of foot 04/11/2012  . Osteomyelitis of metatarsal 04/11/2012  . Type II diabetes mellitus with complication, uncontrolled 04/11/2012  . Hyponatremia 04/11/2012  . Anemia 04/11/2012  . Peripheral vascular disease due to secondary diabetes mellitus 04/11/2012  . Seizure disorder 04/11/2012    PT - End of Session Equipment Utilized During Treatment: Gait belt Activity Tolerance: Patient tolerated treatment well;Patient limited by fatigue General Behavior During Therapy: [facility] [place] for tasks assessed/performed  GP    [name] 02/23/2013, 3:03 PM

## 2013-02-27 ENCOUNTER — Ambulatory Visit (HOSPITAL_COMMUNITY): Payer: Non-veteran care | Admitting: Physical Therapy

## 2013-02-28 ENCOUNTER — Ambulatory Visit (HOSPITAL_COMMUNITY): Payer: Non-veteran care | Admitting: Physical Therapy

## 2013-03-08 ENCOUNTER — Ambulatory Visit (HOSPITAL_COMMUNITY)
Admission: RE | Admit: 2013-03-08 | Discharge: 2013-03-08 | Disposition: A | Discharge status: Home / Self Care | Payer: Non-veteran care | Source: Ambulatory Visit | Attending: *Deleted | Admitting: *Deleted

## 2013-03-08 DIAGNOSIS — R262 Difficulty in walking, not elsewhere classified: Secondary | ICD-10-CM

## 2013-03-08 NOTE — Progress Notes (Signed)
Physical Therapy Treatment Patient Details  Name: Ian Garcia MRN: [id number] Date of Birth: 04-29-1955  Today's Date: 03/08/2013 Time: 1016-1106 PT Time Calculation (min): 50 min Charges: TE: 50 Visit#: 7 of 12  Re-eval: 03/12/12 (Re-eval from inital date of eval, 12/31 placed b/c of [place])    [place]  Author[place] Time Period: approved 12 vistis until 03/08/13  Authorization Visit#: 7 of 10   Subjective: Symptoms/Limitations Symptoms: Pt reports that he has been trying to work on walking faster at home. He reports that he is still worried about his balance and leaving his Rt leg behind.  Pain Assessment Currently in Pain?: No/denies  Precautions/Restrictions     Exercise/Treatments Aerobic Tread Mill: BWS on TM Gait trainer x15 minutes total, 3 standing rest breaks 0.34mph-1.0 mph w/min A and cueing for RLE step length Seated Long Arc Quad: Right;15 reps;Weights;Limitations Long Arc Quad Weight: 5 lbs. Long [employer] Limitations: 5 sec holds Supine Straight Leg Raises: Both;20 reps (w/leg on) Sidelying Hip ABduction: 20 reps;Both Hip ABduction Limitations: w/leg on Hip ADduction: Both;20 reps Prone  Hip Extension: Both;20 reps Standing Standing Eyes Opened: Wide (BOA) Standing Eyes Opened Limitations: static standing in BSW without UE support   Physical Therapy Assessment and Plan PT Assessment and Plan Clinical Impression Statement: Called and left a message with Cathie Hoops (with Therapy at the [place]) to approve more visits, he will return on Monday.  At this time provided pt with an HEP and blue theraband for activities to continue with at home.  Pt continues to require mod cueing for Rt stride length and is limited by his activity tolerance (only able to maintain about 5 minutes of walking at increased speeds).  Has significant improvement with gait mechanics when he is able to increase rt step length.  Pt able to explain at end of session  importance of Rt step length with gait mechanics to decrease his risk of falls.  PT Plan: ONLY CHARGE TE OR PROTHETIC TRAINING. Continue with gluteal strengthening exercises and improving stride and Rt step length.     Goals Home Exercise Program Pt/caregiver will Perform Home Exercise Program: Independently PT Goal: Perform Home Exercise Program - Progress: Progressing toward goal PT Short Term Goals Time to Complete Short Term Goals: 2 weeks PT Short Term Goal 1: Pt will improve proprioceptive awareness and demonstrate narrow BOS stance x1 minute with supervision to decrease risk of falls.  PT Short Term Goal 1 - Progress: Progressing toward goal PT Short Term Goal 2: Pt will improve gait velocity to 1.0 ft/sec with LRAD and supervision to decrease falls risk.  PT Short Term Goal 2 - Progress: Progressing toward goal PT Short Term Goal 3: Pt wil improve his gait mechanics in order to complete the dynamic gait index (DGI). PT Short Term Goal 3 - Progress: Progressing toward goal PT Short Term Goal 4: Pt will improve functional strength and go from sit to stand from standard surface with BUE support without need of AD.  PT Short Term Goal 4 - Progress: Progressing toward goal PT Long Term Goals Time to Complete Long Term Goals: 4 weeks PT Long Term Goal 1: Pt will improve his Berg to 32/56 to decrease risk of falls.  PT Long Term Goal 1 - Progress: Progressing toward goal PT Long Term Goal 2: Pt will improve his DGI to 12/24 to decrease risk of falls in community.  PT Long Term Goal 2 - Progress: Progressing toward goal Long Term Goal 3:  Pt will improve his gait speed to 1.75 ft/sec with indoor and outdoor ambulation with mod I to improve independene in home and commiunity Long Term Goal 3 Progress: Progressing toward goal Long Term Goal 4: Pt will improve his FOTO to limiation less than 39% for improved QOL.  Long Term Goal 4 Progress: Progressing toward goal  Problem List Patient  Active Problem List   Diagnosis Date Noted  . Difficulty in walking(719.7) 02/09/2013  . Gas gangrene of lower extremity 04/11/2012  . Cellulitis and abscess of foot 04/11/2012  . Osteomyelitis of metatarsal 04/11/2012  . Type II diabetes mellitus with complication, uncontrolled 04/11/2012  . Hyponatremia 04/11/2012  . Anemia 04/11/2012  . Peripheral vascular disease due to secondary diabetes mellitus 04/11/2012  . Seizure disorder 04/11/2012    PT Plan of Care PT Home Exercise Plan: provided with mat exercsises and blue theraband. PT Patient Instructions: importance of stride length.  Consulted and Agree with Plan of Care: Patient  GP    Verania Salberg MPT, ATC 03/08/2013, 11:46 AM

## 2013-03-16 NOTE — Progress Notes (Signed)
  Patient Details  Name: Nuala AlphaBruce L Recupero MRN: [id number] Date of Birth: 1955/05/28  Today's Date: 03/16/2013  [facility] and will extend his remaining 5 visits until January 31st.    Medina Degraffenreid 03/16/2013, 11:01 AM

## 2013-03-27 ENCOUNTER — Ambulatory Visit (HOSPITAL_COMMUNITY)
Admission: RE | Admit: 2013-03-27 | Discharge: 2013-03-27 | Disposition: A | Discharge status: Home / Self Care | Payer: Non-veteran care | Source: Ambulatory Visit | Attending: Student | Admitting: Student

## 2013-03-27 DIAGNOSIS — R269 Unspecified abnormalities of gait and mobility: Secondary | ICD-10-CM | POA: Insufficient documentation

## 2013-03-27 DIAGNOSIS — S78119A Complete traumatic amputation at level between unspecified hip and knee, initial encounter: Secondary | ICD-10-CM | POA: Insufficient documentation

## 2013-03-27 DIAGNOSIS — IMO0001 Reserved for inherently not codable concepts without codable children: Secondary | ICD-10-CM | POA: Insufficient documentation

## 2013-03-27 DIAGNOSIS — R262 Difficulty in walking, not elsewhere classified: Secondary | ICD-10-CM | POA: Insufficient documentation

## 2013-03-27 DIAGNOSIS — E119 Type 2 diabetes mellitus without complications: Secondary | ICD-10-CM | POA: Insufficient documentation

## 2013-03-27 NOTE — Progress Notes (Signed)
Physical Therapy Treatment Patient Details  Name: Ian Garcia MRN: [id number] Date of Birth: [birth year]  Today's Date: 03/27/2013 Time: 1518-1600 PT Time Calculation (min): 42 min Charge ;TE [id number]  Visit#: 8 of 12  Re-eval: 03/12/12    [place]  Authorization Time Period: approved 12 vistis until 03/08/13; [facility] and will extend his remaining 5 visits until January 31st  Authorization Visit#: 8 of 10   Subjective: Symptoms/Limitations Symptoms: Pt reports he is pain free today pt stated noncompliance with HEP.  Has been sitting in wheelchair and puts on prosethetic to come to therapy.   Pain Assessment Currently in Pain?: No/denies  Objective:  Exercise/Treatments Aerobic Tread Mill: BWS on TM Gait trainer  minutes total x16 minutes, 3 standing rest breaks working on static balance standing with no UE A 0.669mph x 5 min; 1.2 mph x 5 minutes; 1.2 mph x 6 minutesr cueing for posture and to increase RLE step length for equalize stance phase with gait     Physical Therapy Assessment and Plan PT Assessment and Plan Clinical Impression Statement: Pt with initial LOB at entrance to dept when trying to apply alcohol onto hands, mod assistance required to prevent fall.  Session focus on improving gait mechanics and increasing activity tolerance.  Pt with no seated rest breaks today, did static standing training inbetween gait training reps.  Pt limited by fatigue at end of session, no report of pain. PT Plan: ONLY CHARGE TE OR PROTHETIC TRAINING. Continue with gluteal strengthening exercises and improving stride and Rt step length. Approved for 4 more sessions.      Goals Home Exercise Program Pt/caregiver will Perform Home Exercise Program: Independently PT Short Term Goals Time to Complete Short Term Goals: 2 weeks PT Short Term Goal 1: Pt will improve proprioceptive awareness and demonstrate narrow BOS stance x1 minute with supervision  to decrease risk of falls.  PT Short Term Goal 1 - Progress: Progressing toward goal PT Short Term Goal 2: Pt will improve gait velocity to 1.0 ft/sec with LRAD and supervision to decrease falls risk.  PT Short Term Goal 2 - Progress: Progressing toward goal PT Short Term Goal 3: Pt wil improve his gait mechanics in order to complete the dynamic gait index (DGI). PT Short Term Goal 4: Pt will improve functional strength and go from sit to stand from standard surface with BUE support without need of AD.  PT Long Term Goals Time to Complete Long Term Goals: 4 weeks PT Long Term Goal 1: Pt will improve his Berg to 32/56 to decrease risk of falls.  PT Long Term Goal 2: Pt will improve his DGI to 12/24 to decrease risk of falls in community.  Long Term Goal 3: Pt will improve his gait speed to 1.75 ft/sec with indoor and outdoor ambulation with mod I to improve independene in home and commiunity Long Term Goal 4: Pt will improve his FOTO to limiation less than 39% for improved QOL.   Problem List Patient Active Problem List   Diagnosis Date Noted  . Difficulty in walking(719.7) 02/09/2013  . Gas gangrene of lower extremity 04/11/2012  . Cellulitis and abscess of foot 04/11/2012  . Osteomyelitis of metatarsal 04/11/2012  . Type II diabetes mellitus with complication, uncontrolled 04/11/2012  . Hyponatremia 04/11/2012  . Anemia 04/11/2012  . Peripheral vascular disease due to secondary diabetes mellitus 04/11/2012  . Seizure disorder 04/11/2012       GP    Jennye Moccasinockerham,  Ruby Cola 03/27/2013, 4:20 PM

## 2013-03-31 ENCOUNTER — Ambulatory Visit (HOSPITAL_COMMUNITY)
Admission: RE | Admit: 2013-03-31 | Discharge: 2013-03-31 | Disposition: A | Discharge status: Home / Self Care | Payer: Non-veteran care | Source: Ambulatory Visit | Attending: *Deleted | Admitting: *Deleted

## 2013-03-31 DIAGNOSIS — R262 Difficulty in walking, not elsewhere classified: Secondary | ICD-10-CM

## 2013-03-31 NOTE — Progress Notes (Signed)
Physical Therapy Treatment Patient Details  Name: Nuala AlphaBruce L Kimmet MRN: [id number] Date of Birth: [birth year]  Today's Date: 03/31/2013 Time: 1520-1604 PT Time Calculation (min): 44 min Charge  TE [id number]  Visit#: 9 of 12  Re-eval: 04/08/13 Assessment Diagnosis: Riley LamLt AKA Next MD Visit: Dr. Kipp LaurencePaul Howell Phone: [phone]; Fax: [phone] Prior [facility] [place] [facility]  [facility] [place]  Authorization Time Period: approved 12 vistis until 03/08/13; Spoke with Cathie [facility]ennis [facility]ongiorni[facility] and will extend his remaining 5 visits until January 31st  Authorization Visit#: 9 of 10   Subjective: Symptoms/Limitations Symptoms: Pt entered dept stateing he does not feel like prostetic limb with increased ER amd groin pain today. Pain Assessment Currently in Pain?: Yes Pain Score: 5  Pain Location: Groin Pain Orientation: Left  Objective:  Exercise/Treatments Aerobic Tread Mill: BWS on TM Gait trainer  minutes total x 4minutes, stopped early due to c/o groin pain with gait; cueing with gait for posture and to increase RLE step length for equalize stance phase with gait Standing Other Standing Knee Exercises: Static standing no AD Supine Bridges: 20 reps Straight Leg Raises: Both;20 reps;Limitations Straight Leg Raises Limitations: 5# Bil LE Other Supine Knee Exercises: Prosthetic limb adjustment  Balance Exercises Standing Standing Eyes Opened: Wide (BOA) Standing Eyes Opened Limitations: static standing no HHA x 8 min, gentle pertubation Other Standing Exercises: weight w/o UE A: R/L, A/P x2 minutes, hula hoop 2 minutes to improve hip mobiltiy, Figure 8 x2 minutes  Seated Other Seated Exercises: on dynadisc march and hold 10x5", dead bug 10x 5"   Physical Therapy Assessment and Plan PT Assessment and Plan Clinical Impression Statement: Ended gait training on TM early this sessoin due to c/o groin pain with gait.  Sessoin focus on static standing balance  withiout HHA and dynamic sitting balance for core strengthening with min-mod assistance required.   Pt limtied by fatigue, no reports of increased pain through session.   PT Plan: ONLY CHARGE TE OR PROTHETIC TRAINING. Continue with gluteal strengthening exercises and improving stride and Rt step length. Approved for 3 more sessions.  Gcode next session.      Goals Home Exercise Program Pt/caregiver will Perform Home Exercise Program: Independently PT Short Term Goals Time to Complete Short Term Goals: 2 weeks PT Short Term Goal 1: Pt will improve proprioceptive awareness and demonstrate narrow BOS stance x1 minute with supervision to decrease risk of falls.  PT Short Term Goal 1 - Progress: Progressing toward goal PT Short Term Goal 2: Pt will improve gait velocity to 1.0 ft/sec with LRAD and supervision to decrease falls risk.  PT Short Term Goal 2 - Progress: Progressing toward goal PT Short Term Goal 3: Pt wil improve his gait mechanics in order to complete the dynamic gait index (DGI). PT Short Term Goal 4: Pt will improve functional strength and go from sit to stand from standard surface with BUE support without need of AD.  PT Long Term Goals Time to Complete Long Term Goals: 4 weeks PT Long Term Goal 1: Pt will improve his Berg to 32/56 to decrease risk of falls.  PT Long Term Goal 1 - Progress: Progressing toward goal PT Long Term Goal 2: Pt will improve his DGI to 12/24 to decrease risk of falls in community.  Long Term Goal 3: Pt will improve his gait speed to 1.75 ft/sec with indoor and outdoor ambulation with mod I to improve independene in home and commiunity Long Term Goal 4:  Pt will improve his FOTO to limiation less than 39% for improved QOL.   Problem List Patient Active Problem List   Diagnosis Date Noted  . Difficulty in walking(719.7) 02/09/2013  . Gas gangrene of lower extremity 04/11/2012  . Cellulitis and abscess of foot 04/11/2012  . Osteomyelitis of metatarsal  04/11/2012  . Type II diabetes mellitus with complication, uncontrolled 04/11/2012  . Hyponatremia 04/11/2012  . Anemia 04/11/2012  . Peripheral vascular disease due to secondary diabetes mellitus 04/11/2012  . Seizure disorder 04/11/2012    PT - End of Session Equipment Utilized During Treatment: Gait belt Activity Tolerance: Patient tolerated treatment well;Patient limited by fatigue General Behavior During Therapy: [facility] for tasks assessed/performed  GP    [name] 03/31/2013, 6:56 PM

## 2013-04-05 ENCOUNTER — Ambulatory Visit (HOSPITAL_COMMUNITY)
Admission: RE | Admit: 2013-04-05 | Discharge: 2013-04-05 | Disposition: A | Discharge status: Home / Self Care | Payer: Non-veteran care | Source: Ambulatory Visit | Attending: *Deleted | Admitting: *Deleted

## 2013-04-05 DIAGNOSIS — R262 Difficulty in walking, not elsewhere classified: Secondary | ICD-10-CM

## 2013-04-05 NOTE — Evaluation (Signed)
Physical Therapy RE-  Evaluation/ REquesting Further visits from [place]   Patient Details  Name: Ian Garcia MRN: [id number] Date of Birth: 08/11/1955  Today's Date: 04/05/2013 Time: 0800-0850 PT Time Calculation (min): 50 min 0814-4818 TE + BERG balance and FOTO               Visit#: 10 of 12  Re-eval: 04/08/13 Assessment Diagnosis: Kelly Splinter Next MD Visit: Dr. Norwood Levo Phone: [phone]; Fax: [phone] Prior Therapy: since December 2013-July 2014 at the [place]: [place] Time Period: approved 12 vistis until 03/08/13; Spoke with Charlann Noss, PT at [place] and will extend his remaining 5 visits until January 31st  Authorization Visit#: 10 of 12   Past Medical History:  Past Medical History  Diagnosis Date  . Diabetes mellitus   . Diabetic neuropathy   . Seizures   . Hypertension   . Acid reflux   . Hypercholesteremia   . Peripheral vascular disease due to secondary diabetes mellitus   . CVA (cerebral infarction)   . Carotid stenosis     s/p CEA on the left  . Glaucoma    Past Surgical History:  Past Surgical History  Procedure Laterality Date  . Eye surgery    . Left femoral to tibial bypass  03/16/2011    [facility]  . Amputation left great toe  02/2012    [facility]  . 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  He reports that it started with a sore on his foot back in November 2013 and led to a BKA on 03/03/12 and went back in in April 2014 had his AKA which started as a sore on his great toe.  He was in and out of [place]  rehab from Dec 2013-July 2014.  Pt reports that he was never a fast walker even before he had an amputation. He has had his  prothetic for about 3 months.  "I've lost my motivation to walk at home."  Pt reports that he is now motivated to walk in rehab.   Pt reports his biggest prolem is walking on uneven surface.  he would like to try walk with crutches Limitations: Standing;Walking;House hold activities Patient Stated Goals: improve walking  Pain Assessment Currently in Pain?: No/denies, groin discomfort intermittent with prosthesis     Functional Tests Functional Tests: FOTO 38     Exercise/Treatments Mobility/Balance  Ambulation/Gait Ambulation/Gait: Yes Ambulation/Gait Assistance: 7: Independent Assistive device: Rolling walker Gait Pattern: Step-through pattern;Decreased stride length;Decreased stance time - left;Decreased weight shift to left;Left circumduction Berg Balance Test Sit to Stand: Able to stand  independently using hands Standing Unsupported: Able to stand 30 seconds unsupported Sitting with Back Unsupported but Feet Supported on Floor or Stool: Able to sit safely and securely 2 minutes Stand to Sit: Controls descent by using hands Transfers: Able to transfer safely, definite need of hands Standing Unsupported with Eyes Closed: Able to stand 3 seconds Standing Ubsupported with Feet Together: Needs help to attain position but able to stand for 30 seconds with feet together From Standing, Reach Forward with Outstretched Arm: Can reach forward >5 cm safely (2") From  Standing Position, Pick up Object from Floor: Unable to try/needs assist to keep balance From Standing Position, Turn to Look Behind Over each Shoulder: Turn sideways only but maintains balance Turn 360 Degrees: Able to turn 360 degrees safely but slowly Standing Unsupported, Alternately Place Feet on Step/Stool: Able to complete >2 steps/needs minimal assist Standing Unsupported, One Foot in Front: Needs help to step but can hold 15 seconds Standing on One Leg: Unable to try or needs assist to prevent fall Total Score: 26      Aerobic Tread Mill: BWS on TM Gait trainer  minutes total x 37mnutes, ; cueing with gait for posture and to increase RLE step length for equalize stance phase with gait Isokinetic: 1.0 mph    Balance Exercises Standing Standing Eyes Opened: Wide (BOA) Standing Eyes Opened Limitations: static standing no HHA x 8 min, gentle pertubation Standing, One Foot on a Step: Eyes open;4 inch;2 reps;30 secs;1 rep;Time;Limitations Standing, One Foot on a Step Time: 1 rep 60 sec.  COmpleted BLE Other Standing Exercises: Rt foot on 4in step to increase Lt weight bearing.             Physical Therapy Assessment and Plan PT Assessment and Plan Clinical Impression Statement: improved BERG balance score significantly from 12 to 26/56, still high falls risk but only 10 visit outpatient services since left AKA. He requires further skilled rehab to improve gait safety, strength, and functional activities. Without skilled rehab patient at risk for falls and at risk for not using prosthetic. He is improving weight bearing and stance time of Left AKA and is motivated for further improvements. His confidence is improving and increassed motivation to use AKA at home. Use of body weight supported treadmil helping to reach goals and build confidence Pt will benefit from skilled therapeutic intervention in order to improve on the following deficits: Abnormal gait;Decreased strength;Difficulty walking;Decreased mobility;Decreased balance;Pain Rehab Potential: Good PT Frequency: Min 2X/week PT Duration: 6 weeks PT Plan: ONLY CHARGE TE OR PROTHETIC TRAINING. Continue with gluteal strengthening exercises and improving stride and Rt step length. Approved for 2  more sessions. Requesting 12 additional visits     Goals Home Exercise Program Pt/caregiver will Perform Home Exercise Program: Independently PT Goal: Perform Home Exercise Program - Progress: Progressing toward goal PT Short Term Goals Time to Complete Short  Term Goals: 2 weeks PT Short Term Goal 1: Pt will improve proprioceptive awareness and demonstrate narrow BOS stance x1 minute with supervision to decrease risk of falls.  PT Short Term Goal 1 - Progress: Progressing toward goal PT Short Term Goal 2: Pt will improve gait velocity to 1.0 ft/sec with LRAD and supervision to decrease falls risk.  PT Short Term Goal 2 - Progress: Progressing toward goal PT Short Term Goal 3: Pt wil improve his gait mechanics in order to complete the dynamic gait index (DGI). PT Short Term Goal 3 - Progress: Progressing toward goal, DGI not tested. Completed BERG this visit  PT Short Term Goal 4: Pt will improve functional strength and go from sit to stand from standard surface with BUE support without need of AD.  PT Short Term Goal 4 - Progress: Progressing toward goal- proficient 50% of time  PT Long Term Goals Time to Complete Long Term Goals: 4 weeks PT Long Term Goal 1: Pt will improve his Berg to 32/56 to decrease risk of falls.  PT Long Term Goal 1 - Progress: Progressing toward goal  Currently  26/56 PT Long Term Goal  2: Pt will improve his DGI to 12/24 to decrease risk of falls in community.  PT Long Term Goal 2 - Progress: Not met Long Term Goal 3: Pt will improve his gait speed to 1.75 ft/sec with indoor and outdoor ambulation with mod I to improve independene in home and commiunity Long Term Goal 3 Progress: Not met      Long Term Goal 4: Pt will improve his FOTO to limiation less than 39% for improved QOL.  Long Term Goal 4 Progress: Not met   Currently 62% limited   Problem List Patient Active Problem List   Diagnosis Date Noted  . Difficulty in walking(719.7) 02/09/2013  . Gas gangrene of lower extremity 04/11/2012  . Cellulitis and abscess of foot 04/11/2012  . Osteomyelitis of metatarsal 04/11/2012  . Type II diabetes mellitus with complication, uncontrolled 04/11/2012  . Hyponatremia 04/11/2012  . Anemia 04/11/2012  . Peripheral vascular  disease due to secondary diabetes mellitus 04/11/2012  . Seizure disorder 04/11/2012    PT - End of Session Equipment Utilized During Treatment: Gait belt Activity Tolerance: Patient tolerated treatment well;Patient limited by fatigue General Behavior During Therapy: [facility] for tasks assessed/performed PT Plan of Care Consulted and Agree with Plan of Care: Patient  GP Functional Assessment Tool Used: FOTO  Functional Limitation: Mobility: Walking and moving around Mobility: Walking and Moving Around Current Status (G2694): At least 60 percent but less than 80 percent impaired, limited or restricted Mobility: Walking and Moving Around Goal Status [phone]): At least 20 percent but less than 40 percent impaired, limited or restricted  Osmel Dykstra 04/05/2013, 9:22 AM  Physician Documentation Your signature is required to indicate approval of the treatment plan as stated above.  Please sign and either send electronically or make a copy of this report for your files and return this physician signed original.   Please mark one 1.__approve of plan  2. ___approve of plan with the following conditions.   ______________________________                                                          _____________________ Physician Signature                                                                                                             Date

## 2013-04-06 ENCOUNTER — Ambulatory Visit (HOSPITAL_COMMUNITY): Payer: Non-veteran care

## 2013-04-06 ENCOUNTER — Ambulatory Visit (HOSPITAL_COMMUNITY)
Admission: RE | Admit: 2013-04-06 | Discharge: 2013-04-06 | Disposition: A | Discharge status: Home / Self Care | Payer: Non-veteran care | Source: Ambulatory Visit | Attending: *Deleted | Admitting: *Deleted

## 2013-04-06 DIAGNOSIS — R262 Difficulty in walking, not elsewhere classified: Secondary | ICD-10-CM

## 2013-04-06 NOTE — Progress Notes (Signed)
Physical Therapy Treatment Patient Details  Name: Ian Garcia MRN: [id number] Date of Birth: 11-Nov-1955  Today's Date: 04/06/2013 Time: 9892-1194 PT Time Calculation (min): 80 min 0845 - 42 for TE   Visit#: 11 of 12  Re-eval: 04/08/13 Assessment Diagnosis: Lt AKA Surgical Date:  (april 2014 AKA left ) Next MD Visit: Dr. Norwood Levo Phone: [phone]; Fax: [phone] Prior Therapy: since December 2013-July 2014 at the [place]: [place] Time Period: approved 12 vistis until 03/08/13; Spoke with Charlann Noss, PT at [place] and will extend his remaining 5 visits until January 31st    requested more visits on 11/29   Authorization Visit#: 11 of 12   Subjective: Symptoms/Limitations Symptoms: denies pain, starting to walk more  at home some with rolling walker buyt w/c primary means of house mobility, will also be here for PT tomorrow, notices size of residual  limb getting smaller  Pertinent History: Pt is a 58 year old male 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  He reports that it started with a sore on his foot back in November 2013 and led to a BKA on 03/03/12 and went back in in April 2014 had his AKA which started as a sore on his great toe.  He was in and out of [place]  rehab from Dec 2013-July 2014.  Pt reports that he was never a fast walker even before he had an amputation. He has had his prothetic for about 3 months.  "I've lost my motivation to walk at home."  Pt reports that he is now motivated to walk in rehab.   Pt reports his biggest prolem is walking on uneven surface.  he would like to try walk with crutches Patient Stated Goals: improve walking  Pain Assessment Pain Score: 0-No pain  Precautions/Restrictions     Exercise/Treatments Mobility/Balance          Aerobic Tread Mill: BWS on TM Gait trainer  minutes total x 73minutes, ; cueing with gait for posture and to increase RLE step length for equalize stance phase with gait Isokinetic: .7 mph            Balance Exercises; all with BWS system this visit for confidence, L LE strengthening  Standing SLS with BWS : Eyes open;Hand held assist (HHA) 1;3 reps on L LE  SLS with Vectors right leg forward and back with BWS : Hand held assist (HHA) 1;10 reps Standing, One Foot on a Step: Eyes open;4 inch;2 reps;30 secs;1 rep;Time;Limitations Other Standing Exercises: weight w/o UE A: R/L, A/P x2 minutes, Other Standing Exercises: Rt foot on 4in step to increase Lt weight bearing.    BWS reaching across body for cones, reaching forward for cones x 10 min     Seated: right hip flexion/ left arm flexion 10 x          Physical Therapy Assessment and Plan PT Assessment and Plan Clinical Impression Statement: patients first step upon standing and in body weight support walker challenging for patient to completely clear foor during swing phase with prosthetic, patient stride and step lengths improving, need improved awareness of placement of prosthetic during standing reaching and balacne exerciises  Pt will benefit from skilled therapeutic intervention in order to improve on the following deficits: Abnormal gait;Decreased strength;Difficulty walking;Decreased mobility;Decreased balance;Pain Rehab Potential: Good PT Frequency: Min 2X/week PT Duration: 6 weeks PT Plan: ONLY CHARGE TE OR PROTHETIC TRAINING. Continue with gluteal strengthening exercises and improving stride and Rt step length. Approved  for 1  more sessions   Goals Home Exercise Program Pt/caregiver will Perform Home Exercise Program: Independently PT Goal: Perform Home Exercise Program - Progress: Progressing toward goal PT Short Term Goals Time to Complete Short Term Goals: 2 weeks PT Short Term Goal 1: Pt will improve proprioceptive awareness and demonstrate narrow BOS stance x1 minute with supervision to decrease risk of falls.  PT Short Term Goal 1 - Progress: Progressing toward goal PT Short Term Goal 2: Pt will improve  gait velocity to 1.0 ft/sec with LRAD and supervision to decrease falls risk.  PT Short Term Goal 2 - Progress: Progressing toward goal PT Short Term Goal 3: Pt wil improve his gait mechanics in order to complete the dynamic gait index (DGI). PT Short Term Goal 3 - Progress: Progressing toward goal PT Short Term Goal 4: Pt will improve functional strength and go from sit to stand from standard surface with BUE support without need of AD.  PT Short Term Goal 4 - Progress: Progressing toward goal PT Long Term Goals Time to Complete Long Term Goals: 4 weeks PT Long Term Goal 1: Pt will improve his Berg to 32/56 to decrease risk of falls.  PT Long Term Goal 1 - Progress: Progressing toward goal PT Long Term Goal 2: Pt will improve his DGI to 12/24 to decrease risk of falls in community.  PT Long Term Goal 2 - Progress: Progressing toward goal Long Term Goal 3: Pt will improve his gait speed to 1.75 ft/sec with indoor and outdoor ambulation with mod I to improve independene in home and commiunity Long Term Goal 3 Progress: Progressing toward goal Long Term Goal 4: Pt will improve his FOTO to limiation less than 39% for improved QOL.  Long Term Goal 4 Progress: Not met  Problem List Patient Active Problem List   Diagnosis Date Noted  . Difficulty in walking(719.7) 02/09/2013  . Gas gangrene of lower extremity 04/11/2012  . Cellulitis and abscess of foot 04/11/2012  . Osteomyelitis of metatarsal 04/11/2012  . Type II diabetes mellitus with complication, uncontrolled 04/11/2012  . Hyponatremia 04/11/2012  . Anemia 04/11/2012  . Peripheral vascular disease due to secondary diabetes mellitus 04/11/2012  . Seizure disorder 04/11/2012    PT - End of Session Equipment Utilized During Treatment: Gait belt Activity Tolerance: Patient tolerated treatment well General Behavior During Therapy: [facility] for tasks assessed/performed PT Plan of Care PT Patient Instructions: infomred patient we have  requested 12 more visits and tomorrow is last approved visit to date  Consulted and Agree with Plan of Care: Patient    Shanera Meske 04/06/2013, 12:40 PM

## 2013-04-07 ENCOUNTER — Ambulatory Visit (HOSPITAL_COMMUNITY): Payer: Non-veteran care

## 2013-06-08 ENCOUNTER — Ambulatory Visit (HOSPITAL_COMMUNITY)
Admission: RE | Admit: 2013-06-08 | Discharge: 2013-06-08 | Disposition: A | Discharge status: Home / Self Care | Payer: Non-veteran care | Source: Ambulatory Visit | Attending: Student | Admitting: Student

## 2013-06-08 DIAGNOSIS — E118 Type 2 diabetes mellitus with unspecified complications: Secondary | ICD-10-CM | POA: Insufficient documentation

## 2013-06-08 DIAGNOSIS — R262 Difficulty in walking, not elsewhere classified: Secondary | ICD-10-CM | POA: Diagnosis not present

## 2013-06-08 DIAGNOSIS — Z9181 History of falling: Secondary | ICD-10-CM | POA: Insufficient documentation

## 2013-06-08 DIAGNOSIS — R269 Unspecified abnormalities of gait and mobility: Secondary | ICD-10-CM | POA: Insufficient documentation

## 2013-06-08 DIAGNOSIS — IMO0001 Reserved for inherently not codable concepts without codable children: Secondary | ICD-10-CM | POA: Insufficient documentation

## 2013-06-08 DIAGNOSIS — S78119A Complete traumatic amputation at level between unspecified hip and knee, initial encounter: Secondary | ICD-10-CM | POA: Diagnosis not present

## 2013-06-08 NOTE — Evaluation (Signed)
Physical Therapy Evaluation  Patient Details  Name: Ian Garcia MRN: [id number] Date of Birth: 12/18/1955  Today's Date: 06/08/2013 Time: 0623-7628 PT Time Calculation (min): 35 min  Charges PT evaluation              Visit#: 1 of 10  Re-eval: 07/08/13 Assessment Diagnosis: left AKA  Next MD Visit: Dr. Providence Lanius MD  Prior Therapy: yes   Authorization: [facility] for [facility] visits , did not see any time frame limitations on orders     Authorization Time Period:    Authorization Visit#: 1 of 10   Past Medical History:  Past Medical History  Diagnosis Date  . Diabetes mellitus   . Diabetic neuropathy   . Seizures   . Hypertension   . Acid reflux   . Hypercholesteremia   . Peripheral vascular disease due to secondary diabetes mellitus   . CVA (cerebral infarction)   . Carotid stenosis     s/p CEA on the left  . Glaucoma    Past Surgical History:  Past Surgical History  Procedure Laterality Date  . Eye surgery    . [facility]  . Amputation left great toe  [facility]  . Cea      On the left    Subjective Symptoms/Limitations Symptoms: presents to therapy with authorization for 10 more visits for left AKA trainng , rarely walking at home with walker and prosthetic, less walking at home since mom died last month , fell getting in car today left elbow scraped  Pertinent History: left AKA april 2014, previous BKA 03/03/12, completed 11 vsits 12/4 - 1/29  Limitations: Walking;House hold activities How long can you walk comfortably?: 5 minutes, using ramp at home okay to get in and out of house  Patient Stated Goals: start walking more at home , walk better in tight areas around obstacles  Pain Assessment Currently in Pain?: Yes Pain Score: 4  Pain Location: Elbow Pain Orientation: Left  Precautions/Restrictions     Balance Screening Balance Screen Has the patient fallen in the past 6 months:  Yes How many times?: 1 today at home gettng in car. lost balance  Has the patient had a decrease in activity level because of a fear of falling? : Yes Is the patient reluctant to leave their home because of a fear of falling? : Yes  Prior Functioning  Prior Function Level of Independence: Independent with transfers;Independent with homemaking with wheelchair Comments: AKA may 2014, limited walking at home with AKA and walker   Cognition/Observation Cognition Overall Cognitive Status: Within Functional Limits for tasks assessed Observation/Other Assessments Observations: presents wearing AKA and using whelled walker     Exercise/Treatments Mobility/Balance  Ambulation/Gait Ambulation/Gait: Yes Gait Pattern: Step-through pattern;Decreased weight shift to left [employer] Test Sit to Stand: Able to stand  independently using hands Standing Unsupported: Able to stand 2 minutes with supervision Sitting with Back Unsupported but Feet Supported on Floor or Stool: Able to sit safely and securely 2 minutes Stand to Sit: Controls descent by using hands Transfers: Able to transfer safely, definite need of hands Standing Unsupported with Eyes Closed: Able to stand 10 seconds with supervision Standing Ubsupported with Feet Together: Able to place feet together independently and stand for 1 minute with supervision From Standing, Reach Forward with Outstretched Arm: Can reach forward >12 cm safely (5") From Standing Position, Pick up Object from Floor:  Unable to pick up shoe, but reaches 2-5 cm (1-2") from shoe and balances independently From Standing Position, Turn to Look Behind Over each Shoulder: Looks behind from both sides and weight shifts well Turn 360 Degrees: Able to turn 360 degrees safely but slowly Standing Unsupported, Alternately Place Feet on Step/Stool: Able to complete 4 steps without aid or supervision Standing Unsupported, One Foot in Front: Needs help to step but can hold 15  seconds Standing on One Leg: Able to lift leg independently and hold equal to or more than 3 seconds Total Score: 38 Timed Up and Go Test TUG: Normal TUG Normal TUG (seconds): 42  Training today walking around floor obstacles with pivot leg as right leg , CGA,    Physical Therapy Assessment and Plan PT Assessment and Plan Clinical Impression Statement: 58 yr old male, authorized for 10 visits for AKA prosthetic training and TE , recently seen her 12/4 - 1/29. He reports limited to no use of AKA and walker at home, he has been using wheelchair for household mobility. He has ability to ambulate household distances with AKA prosthetic and walker. PT needed to improve gait and stability , and encourage patient to no rely on wheelchair at home. Further trainig with gait  for safety around floor obstacles and with turns  Pt will benefit from skilled therapeutic intervention in order to improve on the following deficits: Abnormal gait;Decreased balance;Difficulty walking;Decreased strength;Decreased mobility Rehab Potential: Good Clinical Impairments Affecting Rehab Potential: lack of motivation t home to use prosthetic device due to recent loss of mother  PT Frequency: Min 2 -3 X/week PT Duration: 4- 5  weeks (10 visits ) PT Treatment/Interventions: Gait training;Therapeutic exercise PT Plan: ONLY CHARGE TE AND PROSTHETIC TRAINING     Goals PT Short Term Goals Time to Complete Short Term Goals: 2 weeks PT Short Term Goal 1: report walking at home using walker daily between rooms in house  rather than using wheelchair for household mobility  PT Long Term Goals Time to Complete Long Term Goals: 4 weeks PT Long Term Goal 1: patient transfer sit to stand with min use  of hands to decrease strain on UE and ease car tranfers  PT Long Term Goal 2: Imorve BErg balance score from 39/56 to greater than 44/56 for reduced falls risk  Long Term Goal 3: patent to safely and confidently make turns around floor  obstacles pivoting on either right leg or leg leg without loss of balance of bucking of left AKA for home safety   Problem List Patient Active Problem List   Diagnosis Date Noted  . Abnormality of gait 06/08/2013  . Difficulty in walking(719.7) 02/09/2013  . Gas gangrene of lower extremity 04/11/2012  . Cellulitis and abscess of foot 04/11/2012  . Osteomyelitis of metatarsal 04/11/2012  . Type II diabetes mellitus with complication, uncontrolled 04/11/2012  . Hyponatremia 04/11/2012  . Anemia 04/11/2012  . Peripheral vascular disease due to secondary diabetes mellitus 04/11/2012  . Seizure disorder 04/11/2012    PT - End of Session Equipment Utilized During Treatment: Gait belt PT Plan of Care Consulted and Agree with Plan of Care: Patient  GP Functional Assessment Tool Used: berg balance 39/56, progrssinla judgement, limited use of AKA prosthesis at home, timed up and go > 30 sec  : mobility impairment  Functional Limitation: Mobility: Walking and moving around Mobility: Walking and Moving Around Current Status (U9811): At least 40 percent but less than 60 percent impaired, limited or restricted Mobility: Walking  and Moving Around Goal Status [phone](G8979): At least 20 percent but less than 40 percent impaired, limited or restricted  Raima Geathers 06/08/2013, 4:29 PM  Physician Documentation Your signature is required to indicate approval of the treatment plan as stated above.  Please sign and either send electronically or make a copy of this report for your files and return this physician signed original.   Please mark one 1.__approve of plan  2. ___approve of plan with the following conditions.   ______________________________                                                          _____________________ Physician Signature                                                                                                             Date

## 2013-06-14 ENCOUNTER — Ambulatory Visit (HOSPITAL_COMMUNITY)
Admission: RE | Admit: 2013-06-14 | Discharge: 2013-06-14 | Disposition: A | Discharge status: Home / Self Care | Payer: Non-veteran care | Source: Ambulatory Visit | Attending: Student | Admitting: Student

## 2013-06-14 DIAGNOSIS — IMO0001 Reserved for inherently not codable concepts without codable children: Secondary | ICD-10-CM | POA: Diagnosis not present

## 2013-06-14 DIAGNOSIS — R269 Unspecified abnormalities of gait and mobility: Secondary | ICD-10-CM

## 2013-06-14 NOTE — Progress Notes (Signed)
Physical Therapy Treatment Patient Details  Name: Ian Garcia MRN: [id number] Date of Birth: Feb 06, 1956  Today's Date: 06/14/2013 Time: 4098-11911058-1148 PT Time Calculation (min): 50 min Charge: TE [id number]  Visit#: 2 of 10  Re-eval: 07/08/13 Assessment Diagnosis: left AKA  Next MD Visit: Dr. Providence LaniusHowell MD  Prior Therapy: yes   Authorization: [facility] authorized for [facility] , did not see any time frame limitations on orders   Authorization Time Period:    Authorization Visit#: 2 of 10   Subjective: Symptoms/Limitations Symptoms: Pain free today, reports completeing more walking around the house since last session.  Pt completed lots of cleaning including sweeping, mopping and vaccuuming, LOB episodes requiring RW to prevent falls. Pain Assessment Currently in Pain?: No/denies  Objective:   Exercise/Treatments Aerobic Tread Mill: Gait training on TM with BWS 0.668mph; Weight bearing activities includng R/L, A/P and staggered stance to increse weight bearin' Standing Forward Lunges: Right;2 sets;10 reps;5 seconds;Limitations Forward Lunges Limitations: Rt LE on 6 in step Gait Training: RW 4RT around dept working on cutting sharp corners and obstacles.  Min assistance required for LOB with corners.      Physical Therapy Assessment and Plan PT Assessment and Plan Clinical Impression Statement: Began POC focusing on gait training and trying to increase weight bearing/distrubution Lt LE for more normalized gait mechanics.  Verbal and visual cueing required to improve posture, increase Rt LE step length, and to increase weight bearing Lt LE.  Pt limited by fatiigue, no reports of increased pai PT Plan: ONLY CHARGE TE AND PROSTHETIC TRAINING Continue with gait training to improve mechanics and increase Lt LE weight bearing, obstacles for safety at home and in community settings.    Goals PT Short Term Goals Time to Complete Short Term Goals: 2 weeks PT Short Term Goal 1: report walking at  home using walker daily between rooms in house  rather than using wheelchair for household mobility  PT Short Term Goal 1 - Progress: Progressing toward goal PT Long Term Goals Time to Complete Long Term Goals: 4 weeks PT Long Term Goal 1: patient transfer sit to stand with min use  of hands to decrease strain on UE and ease car tranfers  PT Long Term Goal 1 - Progress: Progressing toward goal PT Long Term Goal 2: Imorve BErg balance score from 39/56 to greater than 44/56 for reduced falls risk  Long Term Goal 3: patent to safely and confidently make turns around floor obstacles pivoting on either right leg or leg leg without loss of balance of bucking of left AKA for home safety   Problem List Patient Active Problem List   Diagnosis Date Noted  . Abnormality of gait 06/08/2013  . Difficulty in walking(719.7) 02/09/2013  . Gas gangrene of lower extremity 04/11/2012  . Cellulitis and abscess of foot 04/11/2012  . Osteomyelitis of metatarsal 04/11/2012  . Type II diabetes mellitus with complication, uncontrolled 04/11/2012  . Hyponatremia 04/11/2012  . Anemia 04/11/2012  . Peripheral vascular disease due to secondary diabetes mellitus 04/11/2012  . Seizure disorder 04/11/2012    PT - End of Session Equipment Utilized During Treatment: Gait belt Activity Tolerance: Patient tolerated treatment well General Behavior During Therapy: [facility] for tasks assessed/performed  GP    Ian Garcia 06/14/2013, 11:56 AM

## 2013-06-15 ENCOUNTER — Ambulatory Visit (HOSPITAL_COMMUNITY): Payer: Non-veteran care | Admitting: Physical Therapy

## 2013-06-15 ENCOUNTER — Telehealth (HOSPITAL_COMMUNITY): Payer: Self-pay

## 2013-06-19 ENCOUNTER — Ambulatory Visit (HOSPITAL_COMMUNITY)
Admission: RE | Admit: 2013-06-19 | Discharge: 2013-06-19 | Disposition: A | Discharge status: Home / Self Care | Payer: Non-veteran care | Source: Ambulatory Visit | Attending: Student | Admitting: Student

## 2013-06-19 DIAGNOSIS — IMO0001 Reserved for inherently not codable concepts without codable children: Secondary | ICD-10-CM | POA: Diagnosis not present

## 2013-06-19 DIAGNOSIS — R269 Unspecified abnormalities of gait and mobility: Secondary | ICD-10-CM

## 2013-06-19 NOTE — Progress Notes (Addendum)
Physical Therapy Treatment Patient Details  Name: Ian Garcia MRN: [id number] Date of Birth: 1955-11-08  Today's Date: 06/19/2013 Time: 4098-11911518-1556 PT Time Calculation (min): 38 min Charge : [id number]  Visit#: 3 of 10  Re-eval: 07/08/13 Assessment Diagnosis: left AKA  Next MD Visit: Dr. Providence LaniusHowell MD  Prior Therapy: yes   Authorization: [facility] authorized for [facility] visits , did not see any time frame limitations on orders   Authorization Time Period:    Authorization Visit#: 3 of 10   Subjective: Symptoms/Limitations Symptoms: Pain free today, everything is getting easier.  Most difficulty currently having is carrying items while walking. Pain Assessment Currently in Pain?: No/denies  Objective:   Exercise/Treatments Standing Gait Training: Gait training long hallway obstacle course with RW x 4RT; Trial with QC gait training x 4 feet- pt too fatigue for sequencing this session. Other Standing Knee Exercises: Pick up 10 cones from floor Other Standing Knee Exercises: Partial tandem stance no HHA with 6in step x 1 min x 3reps.  10 head turns R/L and Up and down with Rt foot on 6in step Seated Other Seated Knee Exercises: 5 STS no HHA ascending, min HHA descending    Physical Therapy Assessment and Plan PT Assessment and Plan Clinical Impression Statement: Pt improving equal weight bearing with gait training.  Began obstacle course with min cueing/assistance required; main cueing for posture and equalized stance phase.  Pt able to pick up cones from floor with SBA.  Trial with gait training with QC, max cueing for sequencing, pt too fatigued to complete safely. PT Plan: ONLY CHARGE TE AND PROSTHETIC TRAINING Continue with gait training to improve mechanics and increase Lt LE weight bearing, obstacles for safety at home and in community settings.  Begin gait training with hemiwalker or quad cane next session.    Goals PT Short Term Goals Time to Complete Short Term Goals: 2 weeks PT  Short Term Goal 1: report walking at home using walker daily between rooms in house  rather than using wheelchair for household mobility  PT Short Term Goal 1 - Progress: Progressing toward goal PT Long Term Goals Time to Complete Long Term Goals: 4 weeks PT Long Term Goal 1: patient transfer sit to stand with min use  of hands to decrease strain on UE and ease car tranfers  PT Long Term Goal 1 - Progress: Progressing toward goal PT Long Term Goal 2: Imorve BErg balance score from 39/56 to greater than 44/56 for reduced falls risk  PT Long Term Goal 2 - Progress: Progressing toward goal Long Term Goal 3: patent to safely and confidently make turns around floor obstacles pivoting on either right leg or leg leg without loss of balance of bucking of left AKA for home safety  Long Term Goal 3 Progress: Progressing toward goal  Problem List Patient Active Problem List   Diagnosis Date Noted  . Abnormality of gait 06/08/2013  . Difficulty in walking(719.7) 02/09/2013  . Gas gangrene of lower extremity 04/11/2012  . Cellulitis and abscess of foot 04/11/2012  . Osteomyelitis of metatarsal 04/11/2012  . Type II diabetes mellitus with complication, uncontrolled 04/11/2012  . Hyponatremia 04/11/2012  . Anemia 04/11/2012  . Peripheral vascular disease due to secondary diabetes mellitus 04/11/2012  . Seizure disorder 04/11/2012    PT - End of Session Equipment Utilized During Treatment: Gait belt Activity Tolerance: Patient tolerated treatment well General Behavior During Therapy: [facility] for tasks assessed/performed  GP    Ian Garcia 06/19/2013, 4:05  PM

## 2013-06-20 ENCOUNTER — Ambulatory Visit (HOSPITAL_COMMUNITY)
Admission: RE | Admit: 2013-06-20 | Discharge: 2013-06-20 | Disposition: A | Discharge status: Home / Self Care | Payer: Non-veteran care | Source: Ambulatory Visit | Attending: Student | Admitting: Student

## 2013-06-20 DIAGNOSIS — IMO0001 Reserved for inherently not codable concepts without codable children: Secondary | ICD-10-CM | POA: Diagnosis not present

## 2013-06-20 DIAGNOSIS — R269 Unspecified abnormalities of gait and mobility: Secondary | ICD-10-CM

## 2013-06-20 NOTE — Progress Notes (Signed)
Physical Therapy Treatment Patient Details  Name: Ian Garcia MRN: [id number] Date of Birth: 1955/08/01  Today's Date: 06/20/2013 Time: 1105-1150 PT Time Calculation (min): 45 min Charges: Therapeutic Exercise [id number], Prosthetic Training [id number]  Visit#: 4 of 10  Re-eval: 07/08/13 Assessment Diagnosis: left AKA  Next MD Visit: Dr. Providence LaniusHowell MD  Prior Therapy: yes   Subjective: Symptoms/Limitations Symptoms: Pain free today,patient notes he is still limited in walking distance due to fatigue.  Pain Assessment Currently in Pain?: No/denies  Exercise/Treatments Standing Gait Training: gait training with dues to decrease forward flexiong of trunk 18850ft, Gait training 8530ft with quad cane in Rt UE with moderate assit for weight shifting and balance, Gait training with Bilateral Cain in parallel bars with CGA for safety and tactile cuing to increase weight shifting. 5140ft Other Standing Knee Exercises: Single leg stance with hip abduction 10x bilateral.  Other Standing Knee Exercises: Frontal plane hip excursions 10x, Split stance frontal plane hip excursions.   Physical Therapy Assessment and Plan PT Assessment and Plan Clinical Impression Statement: Patient initially ambuloated with trunk forward flexed, upon corrction of walker height patient is able to ambulate with more up right trunk, noting decreased train on UE. Attempted gait training with unilateral quad cain and bilateral caines for which patient displayed difficulty primarily attributed to difficulty weight shifting From Rt to Lt and Lt to Rt LE. Patient required max cuing, but was able to improve weight shifting following exercises and gait training.  Pt will benefit from skilled therapeutic intervention in order to improve on the following deficits: Abnormal gait;Decreased balance;Difficulty walking;Decreased strength;Decreased mobility Rehab Potential: Good PT Treatment/Interventions: Gait training;Therapeutic  exercise PT Plan: ONLY CHARGE TE AND PROSTHETIC TRAINING Continue with gait training to improve mechanics and increase Lt LE weight bearing, obstacles for safety at home and in community settings.  Begin gait training with hemiwalker or quad cane next session.    Goals PT Short Term Goals PT Short Term Goal 1 - Progress: Progressing toward goal PT Long Term Goals PT Long Term Goal 1 - Progress: Progressing toward goal PT Long Term Goal 2 - Progress: Progressing toward goal Long Term Goal 3 Progress: Progressing toward goal  Problem List Patient Active Problem List   Diagnosis Date Noted  . Abnormality of gait 06/08/2013  . Difficulty in walking(719.7) 02/09/2013  . Gas gangrene of lower extremity 04/11/2012  . Cellulitis and abscess of foot 04/11/2012  . Osteomyelitis of metatarsal 04/11/2012  . Type II diabetes mellitus with complication, uncontrolled 04/11/2012  . Hyponatremia 04/11/2012  . Anemia 04/11/2012  . Peripheral vascular disease due to secondary diabetes mellitus 04/11/2012  . Seizure disorder 04/11/2012    PT Plan of Care PT Home Exercise Plan: split stance frontal plane hip excursions, and single leg stand hip abduction.  Consulted and Agree with Plan of Care: Patient  GP    Hardie PulleyCash R Hagen Bohorquez PT DPT 06/20/2013, 12:08 PM

## 2013-06-26 ENCOUNTER — Ambulatory Visit (HOSPITAL_COMMUNITY)
Admission: RE | Admit: 2013-06-26 | Discharge: 2013-06-26 | Disposition: A | Discharge status: Home / Self Care | Payer: Non-veteran care | Source: Ambulatory Visit | Attending: Student | Admitting: Student

## 2013-06-26 DIAGNOSIS — R269 Unspecified abnormalities of gait and mobility: Secondary | ICD-10-CM

## 2013-06-26 DIAGNOSIS — IMO0001 Reserved for inherently not codable concepts without codable children: Secondary | ICD-10-CM | POA: Diagnosis not present

## 2013-06-26 NOTE — Progress Notes (Signed)
Physical Therapy Treatment Patient Details  Name: Ian Garcia MRN: [id number] Date of Birth: 08-12-1955  Today's Date: 06/26/2013 Time: 4098-11911432-1515 PT Time Calculation (min): 43 min Charge: [id number]  Visit#: 5 of 10  Re-eval: 07/08/13 Assessment Diagnosis: left AKA  Next MD Visit: Dr. Providence LaniusHowell MD  Prior Therapy: yes   Authorization: [facility] authorized for 10 visits , did not see any time frame limitations on orders   Authorization Time Period:    Authorization Visit#: 4 of 10   Subjective: Symptoms/Limitations Symptoms: Pt stated he was tired today at entrance, reports doing minimum at home. Pain Assessment Currently in Pain?: No/denies  Objective:   Exercise/Treatments Standing Gait Training: Gait training with RW x 150 ft, Trial with Hemiwalker gait training x 3620feet with max cueing and mod assistance Other Standing Knee Exercises: Bil abduction 15x, Hip extension 10x Bil LE, side stepping 2RT in // bars, retro gait 1Rt in // bars Seated Other Seated Knee Exercises: 5 STS no HHA ascending, min HHA descending Sidelying Hip ABduction: Both;15 reps Prone  Hip Extension: AROM;Right;AAROM;Left;10 reps;Limitations Hip Extension Limitations: AAROM with prostetic limb      Physical Therapy Assessment and Plan PT Assessment and Plan Clinical Impression Statement: Session focus on improving gait mechanics with LRAD and functional strengthening exercises to improve gait.  Pt continues to require cueing for equal stride length with RW gait.  Trial with hemiwalker, pt displayed increased difficutly primarily wiht weight shifting from Rt <-> Lt with less HHA, max cuieng required and returned to RW for safety.  Began gluteal strengthening activities standing, supine, sidelying and prone.  Pt given HEP for glut strengthening with cueing for proper positioning with prone activities to reduce compensation due to weakness.   PT Plan: ONLY CHARGE TE AND PROSTHETIC TRAINING Continue with  gait training to improve mechanics and increase Lt LE weight bearing, obstacles for safety at home and in community settings.  Begin gait training with hemiwalker or quad cane when ready.  Next session focus on weight distribution activtieis with less HHA.      Goals PT Short Term Goals PT Short Term Goal 1 - Progress: Progressing toward goal PT Long Term Goals PT Long Term Goal 1 - Progress: Progressing toward goal  Problem List Patient Active Problem List   Diagnosis Date Noted  . Abnormality of gait 06/08/2013  . Difficulty in walking(719.7) 02/09/2013  . Gas gangrene of lower extremity 04/11/2012  . Cellulitis and abscess of foot 04/11/2012  . Osteomyelitis of metatarsal 04/11/2012  . Type II diabetes mellitus with complication, uncontrolled 04/11/2012  . Hyponatremia 04/11/2012  . Anemia 04/11/2012  . Peripheral vascular disease due to secondary diabetes mellitus 04/11/2012  . Seizure disorder 04/11/2012    PT - End of Session Equipment Utilized During Treatment: Gait belt Activity Tolerance: Patient tolerated treatment well General Behavior During Therapy: [facility] for tasks assessed/performed  GP    Juel Burrowasey Jo Marsh Heckler 06/26/2013, 4:26 PM

## 2013-06-27 ENCOUNTER — Ambulatory Visit (HOSPITAL_COMMUNITY): Payer: Non-veteran care

## 2013-06-30 ENCOUNTER — Ambulatory Visit (HOSPITAL_COMMUNITY)
Admission: RE | Admit: 2013-06-30 | Discharge: 2013-06-30 | Disposition: A | Discharge status: Home / Self Care | Payer: Non-veteran care | Source: Ambulatory Visit | Attending: Student | Admitting: Student

## 2013-06-30 DIAGNOSIS — IMO0001 Reserved for inherently not codable concepts without codable children: Secondary | ICD-10-CM | POA: Diagnosis not present

## 2013-06-30 DIAGNOSIS — R269 Unspecified abnormalities of gait and mobility: Secondary | ICD-10-CM

## 2013-06-30 NOTE — Progress Notes (Signed)
Physical Therapy Treatment Patient Details  Name: Ian Garcia MRN: [id number] Date of Birth: 1955/06/13  Today's Date: 06/30/2013 Time: [id number] PT Time Calculation (min): 46 min  Charges: TherEx [id number], Prosthetic training [id number] Visit#: 6 of 10  Re-eval: 07/08/13 Assessment Diagnosis: left AKA  Next MD Visit: Dr. Providence LaniusHowell MD  Prior Therapy: yes   Authorization: [facility] authorized for 10 visits , did not see any time frame limitations on orders   Authorization Time Period:    Authorization Visit#: 6 of 10   Subjective: Symptoms/Limitations Symptoms: Patient states that doctor adjusted prostetic and gave him a new sleeve for a beeter fit in prosthetic.  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  Other Standing Knee Exercises: side stepping in parallel bars to improve weight shifting 1940ft each way  Physical Therapy Assessment and Plan PT Assessment and Plan Clinical Impression Statement: Patient arrived with recently adjusted knee displaying improved stability during standing and gait activities.Gait training performed with quad cane with patient requiring verbal and tyactile cues to improve weight shifting and proper sequencing, Patient was able to ambulate down a straight hall for 3730ft with supervision, but required min assist for all turns . Patient also required min to moderate assitance with stand to sit  transfer with quad cane due to difficulty unlocking knee. Overall patient is makeing good progress towards all goals. Patientrequired rest breaks between exercises due to limited activity tolerance.  Pt will benefit from skilled therapeutic intervention in order to improve on the following deficits: Abnormal gait;Decreased balance;Difficulty walking;Decreased strength;Decreased mobility Clinical Impairments Affecting Rehab Potential: lack of motivattionat home to use prosthetic device due to recent loss of mother  PT Treatment/Interventions: Gait training;Therapeutic exercise PT Plan: ONLY CHARGE TE AND PROSTHETIC TRAINING Continue with gait training to improve mechanics and increase Lt LE weight bearing, obstacles for safety at home and in community settings.  Continue gait training with  quad cane with focus on weight shifting and proper sequencing, also continue LE strengthening to decrease UE asssitance with sit to stand. initate sit to stand transfers with only quad cane for assistance.     Goals PT Short Term Goals PT Short Term Goal 1 - Progress: Progressing toward goal PT Long Term Goals PT Long Term Goal 1 - Progress: Progressing toward goal PT Long Term Goal 2 - Progress: Progressing toward goal Long Term Goal 3 Progress: Progressing toward goal  Problem List Patient Active Problem List   Diagnosis Date Noted  . Abnormality of gait 06/08/2013  . Difficulty in walking(719.7) 02/09/2013  . Gas gangrene of lower extremity 04/11/2012  . Cellulitis and abscess of foot 04/11/2012  . Osteomyelitis of metatarsal 04/11/2012  . Type II diabetes mellitus with complication, uncontrolled 04/11/2012  . Hyponatremia 04/11/2012  . Anemia 04/11/2012  . Peripheral vascular disease due to secondary diabetes mellitus 04/11/2012  . Seizure disorder 04/11/2012    General Behavior During Therapy: [facility] assessed/performed PT Plan of Care PT Home  Exercise Plan: split stance frontal plane hip  excursions, and single leg stand hip abduction.  Consulted and Agree with Plan of Care: Patient  GP    Hardie PulleyCash R Marios Gaiser PT DPT 06/30/2013, 12:05 PM

## 2013-07-04 ENCOUNTER — Ambulatory Visit (HOSPITAL_COMMUNITY): Payer: Non-veteran care

## 2013-07-05 ENCOUNTER — Ambulatory Visit (HOSPITAL_COMMUNITY)
Admission: RE | Admit: 2013-07-05 | Discharge: 2013-07-05 | Disposition: A | Discharge status: Home / Self Care | Payer: Non-veteran care | Source: Ambulatory Visit | Attending: Student | Admitting: Student

## 2013-07-05 DIAGNOSIS — IMO0001 Reserved for inherently not codable concepts without codable children: Secondary | ICD-10-CM | POA: Diagnosis not present

## 2013-07-05 DIAGNOSIS — R269 Unspecified abnormalities of gait and mobility: Secondary | ICD-10-CM

## 2013-07-05 NOTE — Progress Notes (Signed)
Physical Therapy Evaluation  Patient Details  Name: Ian Garcia MRN: [id number] Date of Birth: 11/02/1955  Today's Date: 07/05/2013 Time: 5621-3086 PT Time Calculation (min): 49 min   Charges: Prosthetic training: 1105-1138 TherEx:1138-1154            Visit#: 7 of 10  Re-eval: 07/08/13 Assessment Diagnosis: left AKA  Next MD Visit: Dr. Lavone Neri MD  Prior Therapy: yes   Authorization: [place] authorized for 10 visits , did not see any time frame limitations on orders     Authorization Time Period:    Authorization Visit#: 7 of 10   Past Medical History:  Past Medical History  Diagnosis Date  . Diabetes mellitus   . Diabetic neuropathy   . Seizures   . Hypertension   . Acid reflux   . Hypercholesteremia   . Peripheral vascular disease due to secondary diabetes mellitus   . CVA (cerebral infarction)   . Carotid stenosis     s/p CEA on the left  . Glaucoma    Past Surgical History:  Past Surgical History  Procedure Laterality Date  . Eye surgery    . Left femoral to tibial bypass  03/16/2011    [facility]  . Amputation left great toe  02/2012    [facility]  . Cea      On the left    Subjective Symptoms/Limitations Symptoms: Patient notes that his leg continues to shrink resulting in increased instability in prostetic this session compared to previous session. Patient states that he wanted to try walking at home with cane alone, but felt unsafe (patient had previously been instructed only to ambulated with cane at therapy until deemed safe).  Patient states having numbness and tingling in Rt foot at end of session that he regularly feels when fatigued. .  Pertinent History: left AKA april 2014, previous BKA 03/03/12, completed 11 vsits 12/4 - 1/29  Limitations: Walking;House hold activities How long can you walk comfortably?: 5 minutes, using ramp at home okay to get in and out of house  Patient Stated Goals: start walking more at home , walk better in tight  areas around obstacles  Pain Assessment Currently in Pain?: No/denies  Exercise/Treatments Stretches Piriformis Stretch: 1 rep;10 seconds Standing Gait Training: Gait training with quad cane 3x 50 ft min assistance to supervison, cuing given for sequencing and weight shifting.  Other Standing Knee Exercises: Sit to stand 10 repetitions 6 sets from hight table down progressing to lower surface with each set to chair. height (18inches) Requiring od assit initially progressing to stand by assist. Required cuing to correctly sequence.   Physical Therapy Assessment and Plan PT Assessment and Plan Clinical Impression Statement: Patient continues to ambulate with walker though he is progressing in safety and stability with ambulation during cane training. However patient continued to have difficulty performing sit to stand from normal chair height; for which this session focuesed on extensively. After performing multiple bouts of sit to stand from higher to progressively lower surfaces and after recieving verbal cues for sequencing patient was able to safely perform sit to stand with only stand by assistance. Patient continues to fatigue quickly due to difficulty utilizing Lt LE for transfers and gait. Patient also noted numbness and tingling in Right foot following performance of gait training and transfer training noting  that he has experienced that for some time; patient demonstrated limited piriformis mobility for which  tried to perform seated stretching but was tooo limited to perform. Marland Kitchen  Pt will benefit from skilled therapeutic intervention in order to improve on the following deficits: Abnormal gait;Decreased balance;Difficulty walking;Decreased strength;Decreased mobility Rehab Potential: Good Clinical Impairments Affecting Rehab Potential: lack of motivattionat home to use prosthetic device due to recent loss of mother  PT Treatment/Interventions: Gait training;Therapeutic exercise PT Plan: ONLY  CHARGE TE AND PROSTHETIC TRAINING. Continue gait training with  quad cane with focus on weight shifting and proper sequencing, Review sit to stand transfers next session,  Introduce piriformis stretch, and progress dynamic gait training, and Lt LE strengthening as able. Perform reassessment next session.     Goals PT Short Term Goals PT Short Term Goal 1: report walking at home using walker daily between rooms in house  rather than using wheelchair for household mobility  PT Short Term Goal 1 - Progress: Met PT Long Term Goals PT Long Term Goal 1 - Progress: Progressing toward goal PT Long Term Goal 2 - Progress: Progressing toward goal Long Term Goal 3 Progress: Progressing toward goal  Problem List Patient Active Problem List   Diagnosis Date Noted  . Abnormality of gait 06/08/2013  . Difficulty in walking(719.7) 02/09/2013  . Gas gangrene of lower extremity 04/11/2012  . Cellulitis and abscess of foot 04/11/2012  . Osteomyelitis of metatarsal 04/11/2012  . Type II diabetes mellitus with complication, uncontrolled 04/11/2012  . Hyponatremia 04/11/2012  . Anemia 04/11/2012  . Peripheral vascular disease due to secondary diabetes mellitus 04/11/2012  . Seizure disorder 04/11/2012       GP    Deetta Siegmann R Nolberto Cheuvront 07/05/2013, 12:21 PM  Physician Documentation Your signature is required to indicate approval of the treatment plan as stated above.  Please sign and either send electronically or make a copy of this report for your files and return this physician signed original.   Please mark one 1.__approve of plan  2. ___approve of plan with the following conditions.   ______________________________                                                          _____________________ Physician Signature                                                                                                             Date

## 2013-07-06 ENCOUNTER — Ambulatory Visit (HOSPITAL_COMMUNITY): Payer: Non-veteran care

## 2013-07-12 ENCOUNTER — Ambulatory Visit (HOSPITAL_COMMUNITY)
Admission: RE | Admit: 2013-07-12 | Discharge: 2013-07-12 | Disposition: A | Discharge status: Home / Self Care | Payer: Non-veteran care | Source: Ambulatory Visit | Attending: Student | Admitting: Student

## 2013-07-12 DIAGNOSIS — S78119A Complete traumatic amputation at level between unspecified hip and knee, initial encounter: Secondary | ICD-10-CM | POA: Insufficient documentation

## 2013-07-12 DIAGNOSIS — IMO0001 Reserved for inherently not codable concepts without codable children: Secondary | ICD-10-CM | POA: Insufficient documentation

## 2013-07-12 DIAGNOSIS — E118 Type 2 diabetes mellitus with unspecified complications: Secondary | ICD-10-CM | POA: Insufficient documentation

## 2013-07-12 DIAGNOSIS — R269 Unspecified abnormalities of gait and mobility: Secondary | ICD-10-CM

## 2013-07-12 DIAGNOSIS — Z9181 History of falling: Secondary | ICD-10-CM | POA: Insufficient documentation

## 2013-07-12 DIAGNOSIS — R262 Difficulty in walking, not elsewhere classified: Secondary | ICD-10-CM | POA: Insufficient documentation

## 2013-07-12 NOTE — Progress Notes (Signed)
Physical Therapy Re-evaluation/Treatment  Patient Details  Name: Ian Garcia MRN: [id number] Date of Birth: 20-Jan-1956  Today's Date: 07/12/2013 Time: 0160-1093 PT Time Calculation (min): 50 min Charge: Physical performance testing [id number]              Visit#: 8 of 10  Re-eval: 07/08/13 Assessment Diagnosis: left AKA  Next MD Visit: Dr. Lavone Neri MD 07/18/2013 Prior Therapy: yes   Authorization: [facility] authorized for [facility] visits , did not see any time frame limitations on orders     Authorization Time Period:    Authorization Visit#: 8 of 10   Subjective Symptoms/Limitations Symptoms: Pt stated pain scale 6/10 Bil feet.  Pt stated most difficult task is sit to standing.  Has been walking more How long can you walk comfortably?: Able to walk for 15 minutes without rest break (5 minutes, using ramp at home okay to get in and out of house ) Pain Assessment Currently in Pain?: Yes Pain Score: 6  Pain Location: Foot Pain Orientation: Right;Left  Sensation/Coordination/Flexibility/Functional Tests Functional Tests Functional Tests: 5 STS from 22in mat 17"  Exercise/Treatments Mobility/Balance  Berg Balance Test Sit to Stand: Able to stand  independently using hands Standing Unsupported: Able to stand 2 minutes with supervision Sitting with Back Unsupported but Feet Supported on Floor or Stool: Able to sit safely and securely 2 minutes Stand to Sit: Sits safely with minimal use of hands Transfers: Able to transfer safely, definite need of hands Standing Unsupported with Eyes Closed: Able to stand 10 seconds with supervision Standing Ubsupported with Feet Together: Able to place feet together independently and stand for 1 minute with supervision From Standing, Reach Forward with Outstretched Arm: Can reach forward >12 cm safely (5") From Standing Position, Pick up Object from Floor: Able to pick up shoe, needs supervision From Standing Position, Turn to Look Behind Over each  Shoulder: Looks behind one side only/other side shows less weight shift Turn 360 Degrees: Able to turn 360 degrees safely but slowly Standing Unsupported, Alternately Place Feet on Step/Stool: Able to complete >2 steps/needs minimal assist Standing Unsupported, One Foot in Front: Able to take small step independently and hold 30 seconds Standing on One Leg: Able to lift leg independently and hold equal to or more than 3 seconds Total Score: 39 Dynamic Gait Index Level Surface: Mild Impairment Change in Gait Speed: Mild Impairment Gait with Horizontal Head Turns: Moderate Impairment Gait with Vertical Head Turns: Moderate Impairment Gait and Pivot Turn: Mild Impairment Step Over Obstacle: Severe Impairment Step Around Obstacles: Mild Impairment Steps: Moderate Impairment Total Score: 11 Timed Up and Go Test TUG: Normal TUG Normal TUG (seconds): 45    Physical Therapy Assessment and Plan PT Assessment and Plan Clinical Impression Statement: Re-assessment complete with the following findings:  Pt reports walking with RW twice a day for 15 minute segments.  BERG balance test score 39/56, 5 STS from 22in height in 17" with no HHA, DGI score 11/18.  Cueing required to improve weight bearing and distribution and posture with gait training to improve mechanics.Recommend continuing skill intervention to improve functional gait and balance to reduce risk of fallls. PT Plan: Recommend continuing 2x week for 4 more weeks per [facility] authorization to improve gait mechanics and balance to reduce risk of falls.  ONLY CHARGE TE OR PROSTHETIC TRAINING per [facility] coverage.      Goals PT Short Term Goals Time to Complete Short Term Goals: 2 weeks PT Short Term Goal 1: report walking at home  using walker daily between rooms in house  rather than using wheelchair for household mobility  PT Short Term Goal 1 - Progress: Met PT Long Term Goals Time to Complete Long Term Goals: 4 weeks PT Long Term Goal 1: patient  transfer sit to stand with min use  of hands to decrease strain on UE and ease car tranfers  PT Long Term Goal 1 - Progress: Progressing toward goal PT Long Term Goal 2: Imorve BErg balance score from 39/56 to greater than 44/56 for reduced falls risk  PT Long Term Goal 2 - Progress: Progressing toward goal Long Term Goal 3: patent to safely and confidently make turns around floor obstacles pivoting on either right leg or leg leg without loss of balance of bucking of left AKA for home safety  Long Term Goal 3 Progress: Progressing toward goal  Problem List Patient Active Problem List   Diagnosis Date Noted  . Abnormality of gait 06/08/2013  . Difficulty in walking(719.7) 02/09/2013  . Gas gangrene of lower extremity 04/11/2012  . Cellulitis and abscess of foot 04/11/2012  . Osteomyelitis of metatarsal 04/11/2012  . Type II diabetes mellitus with complication, uncontrolled 04/11/2012  . Hyponatremia 04/11/2012  . Anemia 04/11/2012  . Peripheral vascular disease due to secondary diabetes mellitus 04/11/2012  . Seizure disorder 04/11/2012    PT - End of Session Equipment Utilized During Treatment: Gait belt Activity Tolerance: Patient tolerated treatment well General Behavior During Therapy: [facility] for tasks assessed/performed  GP Functional Assessment Tool Used: berg balance 39/56, progrssinla judgement, limited use of AKA prosthesis at home, timed up and go > 30 sec  : mobility impairment  Functional Limitation: Mobility: Walking and moving around Mobility: Walking and Moving Around Current Status (I6803): At least 40 percent but less than 60 percent impaired, limited or restricted Mobility: Walking and Moving Around Goal Status [phone]): At least 20 percent but less than 40 percent impaired, limited or restricted  Aldona Lento 07/12/2013, 4:16 PM  Physician Documentation Your signature is required to indicate approval of the treatment plan as stated above.  Please sign and  either send electronically or make a copy of this report for your files and return this physician signed original.   Please mark one 1.__approve of plan  2. ___approve of plan with the following conditions.   ______________________________                                                          _____________________ Physician Signature                                                                                                             Date

## 2013-07-12 NOTE — Progress Notes (Signed)
Physical Therapy Re-evaluation/Treatment  Patient Details  Name: Ian Garcia MRN: [id number] Date of Birth: 1955-11-12  Today's Date: 07/12/2013 Time: 7414-2395 PT Time Calculation (min): 50 min Charge: Physical performance testing [id number]              Visit#: 8 of 10  Re-eval: 07/08/13 Assessment Diagnosis: left AKA  Next MD Visit: Dr. Lavone Neri MD 07/18/2013 Prior Therapy: yes   Authorization: [facility] authorized for 10 visits , did not see any time frame limitations on orders     Authorization Time Period:    Authorization Visit#: 8 of 10   Subjective Symptoms/Limitations Symptoms: Pt stated pain scale 6/10 Bil feet.  Pt stated most difficult task is sit to standing.  Has been walking more How long can you walk comfortably?: Able to walk for 15 minutes without rest break (5 minutes, using ramp at home okay to get in and out of house ) Pain Assessment Currently in Pain?: Yes Pain Score: 6  Pain Location: Foot Pain Orientation: Right;Left  Sensation/Coordination/Flexibility/Functional Tests Functional Tests Functional Tests: 5 STS from 22in mat 17"  Exercise/Treatments Mobility/Balance  Berg Balance Test Sit to Stand: Able to stand  independently using hands Standing Unsupported: Able to stand 2 minutes with supervision Sitting with Back Unsupported but Feet Supported on Floor or Stool: Able to sit safely and securely 2 minutes Stand to Sit: Sits safely with minimal use of hands Transfers: Able to transfer safely, definite need of hands Standing Unsupported with Eyes Closed: Able to stand 10 seconds with supervision Standing Ubsupported with Feet Together: Able to place feet together independently and stand for 1 minute with supervision From Standing, Reach Forward with Outstretched Arm: Can reach forward >12 cm safely (5") From Standing Position, Pick up Object from Floor: Able to pick up shoe, needs supervision From Standing Position, Turn to Look Behind Over each  Shoulder: Looks behind one side only/other side shows less weight shift Turn 360 Degrees: Able to turn 360 degrees safely but slowly Standing Unsupported, Alternately Place Feet on Step/Stool: Able to complete >2 steps/needs minimal assist Standing Unsupported, One Foot in Front: Able to take small step independently and hold 30 seconds Standing on One Leg: Able to lift leg independently and hold equal to or more than 3 seconds Total Score: 39 Dynamic Gait Index Level Surface: Mild Impairment Change in Gait Speed: Mild Impairment Gait with Horizontal Head Turns: Moderate Impairment Gait with Vertical Head Turns: Moderate Impairment Gait and Pivot Turn: Mild Impairment Step Over Obstacle: Severe Impairment Step Around Obstacles: Mild Impairment Steps: Moderate Impairment Total Score: 11 Timed Up and Go Test TUG: Normal TUG Normal TUG (seconds): 45    Physical Therapy Assessment and Plan PT Assessment and Plan Clinical Impression Statement: Re-assessment complete with the following findings:  Pt reports walking with RW twice a day for 15 minute segments.  BERG balance test score 39/56, 5 STS from 22in height in 17" with no HHA, DGI score 11/18.  Cueing required to improve weight bearing and distribution and posture with gait training to improve mechanics.Recommend continuing skill intervention to improve functional gait and balance to reduce risk of fallls. Patient displays slow but steady progress as all functional measures continue to improve as patient progresses toward long term goals of ambulating with cain independently. Expecting with additional physical therapy patient will meet long term goals and be able to independently maintain improvements. Requesting an additional 4 weeks of physical therapy with 2 session per week to meet long goals and  increase patient independence. PT Plan: Recommend continuing 2x week for 4 more weeks per [facility] authorization to improve gait mechanics and balance  to reduce risk of falls.  ONLY CHARGE TE OR PROSTHETIC TRAINING per [facility] coverage.      Goals PT Short Term Goals Time to Complete Short Term Goals: 2 weeks PT Short Term Goal 1: report walking at home using walker daily between rooms in house  rather than using wheelchair for household mobility  PT Short Term Goal 1 - Progress: Met PT Long Term Goals Time to Complete Long Term Goals: 4 weeks PT Long Term Goal 1: patient transfer sit to stand with min use  of hands to decrease strain on UE and ease car tranfers  PT Long Term Goal 1 - Progress: Progressing toward goal PT Long Term Goal 2: Imorve BErg balance score from 39/56 to greater than 44/56 for reduced falls risk  PT Long Term Goal 2 - Progress: Progressing toward goal Long Term Goal 3: patent to safely and confidently make turns around floor obstacles pivoting on either right leg or leg leg without loss of balance of bucking of left AKA for home safety  Long Term Goal 3 Progress: Progressing toward goal  Problem List Patient Active Problem List   Diagnosis Date Noted  . Abnormality of gait 06/08/2013  . Difficulty in walking(719.7) 02/09/2013  . Gas gangrene of lower extremity 04/11/2012  . Cellulitis and abscess of foot 04/11/2012  . Osteomyelitis of metatarsal 04/11/2012  . Type II diabetes mellitus with complication, uncontrolled 04/11/2012  . Hyponatremia 04/11/2012  . Anemia 04/11/2012  . Peripheral vascular disease due to secondary diabetes mellitus 04/11/2012  . Seizure disorder 04/11/2012    PT - End of Session Equipment Utilized During Treatment: Gait belt Activity Tolerance: Patient tolerated treatment well General Behavior During Therapy: [facility] for tasks assessed/performed  GP Functional Assessment Tool Used: berg balance 39/56, progrssinla judgement, limited use of AKA prosthesis at home, timed up and go > 30 sec  : mobility impairment  Functional Limitation: Mobility: Walking and moving around Mobility: Walking  and Moving Around Current Status (U4383): At least 40 percent but less than 60 percent impaired, limited or restricted Mobility: Walking and Moving Around Goal Status [phone]): At least 20 percent but less than 40 percent impaired, limited or restricted  Aldona Lento 07/12/2013, 4:16 PM  Devona Konig PT DPT 07/12/13 6:40PM  Physician Documentation Your signature is required to indicate approval of the treatment plan as stated above.  Please sign and either send electronically or make a copy of this report for your files and return this physician signed original.   Please mark one 1.__approve of plan  2. ___approve of plan with the following conditions.   ______________________________                                                          _____________________ Physician Signature  Date  

## 2013-07-26 ENCOUNTER — Ambulatory Visit (HOSPITAL_COMMUNITY)
Admission: RE | Admit: 2013-07-26 | Discharge: 2013-07-26 | Disposition: A | Discharge status: Home / Self Care | Payer: Non-veteran care | Source: Ambulatory Visit | Attending: Student | Admitting: Student

## 2013-07-26 DIAGNOSIS — R269 Unspecified abnormalities of gait and mobility: Secondary | ICD-10-CM

## 2013-07-26 NOTE — Progress Notes (Signed)
Physical Therapy Treatment Patient Details  Name: Ian Garcia MRN: [id number] Date of Birth: 1955/11/04  Today's Date: 07/26/2013 Time: [id number] PT Time Calculation (min): 50 min Charge: TE [id number]  Visit#: 9 of 10  Re-eval: 07/08/13 Assessment Diagnosis: left AKA  Next MD Visit: Dr. Providence LaniusHowell MD 07/18/2013 Prior Therapy: yes   Authorization: [facility] authorized for 10 visits , did not see any time frame limitations on orders   Authorization Time Period:    Authorization Visit#: 9 of 10   Subjective: Symptoms/Limitations Symptoms: Pain scale 8/10 Bil feet.  Pt reports he has been walking with RW 2-3x dailiy for 15 minutes. Pain Assessment Currently in Pain?: Yes Pain Score: 8  Pain Location: Foot Pain Orientation: Right;Left  Objective:  Exercise/Treatments Standing Gait Training: Gait training with RW for equalized stance phase and stride length, cueing for posture and heel to toe pattern gait. Other Standing Knee Exercises: sit to stands 10 reps without HHA from mat and 5 reps from normal chiar height; 8 tranfers from mat to chair no HHA.  Static standing 30"-60" Supine Bridges: 20 reps;Limitations Bridges Limitations: with Lt closer Prone  Hip Extension: AAROM;Left;15 reps      Physical Therapy Assessment and Plan PT Assessment and Plan Clinical Impression Statement: Pt able to complete sit to stands from lowest mat and chair height, increased difficulty locking prostetic limb upon standing.  Session focus on improving functional strengthening with sit to stands without HHA, improving transfer abilities and improving gluteal strengthening  to assist wtih locking prostetic limb upon initial standing.  Once locked pt able to stand no HHA for 60" with min guard.  Pt limited by fatigue end of session, stated pain scale remains 8/10 with numb feet.   PT Plan: Continue with current POC to imporve balance, gait and functional strengthening.  Next session focus on gluteal  strengthening for hip extension to increase ease with sit to stand.  CHARGE ONLY TE OR PROSTHETIC TRAINING per [facility] coverage.      Goals PT Short Term Goals Time to Complete Short Term Goals: 2 weeks PT Short Term Goal 1: report walking at home using walker daily between rooms in house  rather than using wheelchair for household mobility  PT Long Term Goals Time to Complete Long Term Goals: 4 weeks PT Long Term Goal 1: patient transfer sit to stand with min use  of hands to decrease strain on UE and ease car tranfers  PT Long Term Goal 1 - Progress: Progressing toward goal PT Long Term Goal 2: Imorve BErg balance score from 39/56 to greater than 44/56 for reduced falls risk  Long Term Goal 3: patent to safely and confidently make turns around floor obstacles pivoting on either right leg or leg leg without loss of balance of bucking of left AKA for home safety  Long Term Goal 3 Progress: Progressing toward goal  Problem List Patient Active Problem List   Diagnosis Date Noted  . Abnormality of gait 06/08/2013  . Difficulty in walking(719.7) 02/09/2013  . Gas gangrene of lower extremity 04/11/2012  . Cellulitis and abscess of foot 04/11/2012  . Osteomyelitis of metatarsal 04/11/2012  . Type II diabetes mellitus with complication, uncontrolled 04/11/2012  . Hyponatremia 04/11/2012  . Anemia 04/11/2012  . Peripheral vascular disease due to secondary diabetes mellitus 04/11/2012  . Seizure disorder 04/11/2012    PT - End of Session Equipment Utilized During Treatment: Gait belt Activity Tolerance: Patient limited by fatigue;Patient tolerated treatment well General Behavior During Therapy:  WFL for tasks assessed/performed  GP    Ian BurrowCasey Jo Jayleana Garcia 07/26/2013, 2:58 PM

## 2013-08-01 ENCOUNTER — Ambulatory Visit (HOSPITAL_COMMUNITY)
Admission: RE | Admit: 2013-08-01 | Discharge: 2013-08-01 | Disposition: A | Discharge status: Home / Self Care | Payer: Non-veteran care | Source: Ambulatory Visit | Attending: Student | Admitting: Student

## 2013-08-01 DIAGNOSIS — R269 Unspecified abnormalities of gait and mobility: Secondary | ICD-10-CM

## 2013-08-01 NOTE — Progress Notes (Addendum)
Physical Therapy Treatment Patient Details  Name: Ian Garcia MRN: [id number] Date of Birth: [birth year]  Today's Date: 08/01/2013 Time: 1345-1440 PT Time Calculation (min): 55 min Charge: TE [id number]  Visit#: 10 of 10  Re-eval: 08/09/13 Assessment Diagnosis: left AKA  Next MD Visit: Dr. Providence LaniusHowell MD 07/18/2013 Prior Therapy: yes   Authorization: Evaluation therapist with [facility] approval [facility] lacking # of visits and timeframe approved for  Authorization Time Period: Gcode complete on 8th visit  Authorization Visit#: 10 of 18   Subjective: Symptoms/Limitations Symptoms: Pt stated feet continue to tingle, 8/10.  Pt c/o prostetic limb not fitting correctly.   Pain Assessment Currently in Pain?: Yes Pain Score: 8  Pain Location: Foot Pain Orientation: Right;Left  Objective:   Exercise/Treatments Stretches Quad Stretch: 3 reps;30 seconds;Limitations LobbyistQuad Stretch Limitations: Rt LE Gastroc Stretch: 3 reps;30 seconds;Limitations Gastroc Stretch Limitations: Rt LE slant board Aerobic Stationary Bike: Nustep hill level 3, resistance 3 x 10 min Standing Other Standing Knee Exercises: sit to stands 10 reps without HHA from mat and 5 reps from normal chiar height; 8 tranfers from mat to chair no HHA.  Static standing max of 3min SBA    Physical Therapy Assessment and Plan PT Assessment and Plan Clinical Impression Statement: Pt functional strength is improving, able to complete sit to stands from lowest height on mat and chair height without HHA.  Pt continues to require assistance with standing due to instability believed to be related to improper fit with prostetic limb.  Able to stand no UE A for 3 minutes today with SBA.  Began Nustep at end of session for strengthening and to improve activity tolerance, pt limited by fatigue following.  Pt encouraged to call prostetic limb makers to improve fitting.   Evatluated therapist stated he spo PT Plan:  Continue with current POC to imporve balance, gait and functional strengthening.  Next session focus on gluteal strengthening for hip extension to increase ease with sit to stand.  CHARGE ONLY TE OR PROSTHETIC TRAINING per [facility] coverage.      Goals PT Short Term Goals Time to Complete Short Term Goals: 2 weeks PT Short Term Goal 1: report walking at home using walker daily between rooms in house  rather than using wheelchair for household mobility  PT Long Term Goals Time to Complete Long Term Goals: 4 weeks PT Long Term Goal 1: patient transfer sit to stand with min use  of hands to decrease strain on UE and ease car tranfers  PT Long Term Goal 1 - Progress: Progressing toward goal PT Long Term Goal 2: Imorve BErg balance score from 39/56 to greater than 44/56 for reduced falls risk  PT Long Term Goal 2 - Progress: Progressing toward goal Long Term Goal 3: patent to safely and confidently make turns around floor obstacles pivoting on either right leg or leg leg without loss of balance of bucking of left AKA for home safety  Long Term Goal 3 Progress: Progressing toward goal  Problem List Patient Active Problem List   Diagnosis Date Noted  . Abnormality of gait 06/08/2013  . Difficulty in walking(719.7) 02/09/2013  . Gas gangrene of lower extremity 04/11/2012  . Cellulitis and abscess of foot 04/11/2012  . Osteomyelitis of metatarsal 04/11/2012  . Type II diabetes mellitus with complication, uncontrolled 04/11/2012  . Hyponatremia 04/11/2012  . Anemia 04/11/2012  . Peripheral vascular disease due to secondary diabetes mellitus 04/11/2012  . Seizure disorder 04/11/2012  PT - End of Session Equipment Utilized During Treatment: Gait belt Activity Tolerance: Patient limited by fatigue;Patient tolerated treatment well General Behavior During Therapy: [facility] for tasks assessed/performed  GP Functional Assessment Tool Used: measured by berg balance 39/56, progrssinla judgement, limited use  of AKA prosthesis at home, timed up and go > 30 sec  : mobility impairment   [name] 08/01/2013, 4:16 PM

## 2013-09-15 ENCOUNTER — Emergency Department (HOSPITAL_COMMUNITY)
Admission: EM | Admit: 2013-09-15 | Discharge: 2013-09-16 | Disposition: A | Discharge status: Other Acute Inpatient Hospital | Payer: Non-veteran care | Attending: Emergency Medicine | Admitting: Emergency Medicine

## 2013-09-15 ENCOUNTER — Emergency Department (HOSPITAL_COMMUNITY): Payer: Non-veteran care

## 2013-09-15 ENCOUNTER — Encounter (HOSPITAL_COMMUNITY): Payer: Self-pay | Admitting: Emergency Medicine

## 2013-09-15 DIAGNOSIS — Z79899 Other long term (current) drug therapy: Secondary | ICD-10-CM | POA: Insufficient documentation

## 2013-09-15 DIAGNOSIS — K219 Gastro-esophageal reflux disease without esophagitis: Secondary | ICD-10-CM | POA: Insufficient documentation

## 2013-09-15 DIAGNOSIS — S98119A Complete traumatic amputation of unspecified great toe, initial encounter: Secondary | ICD-10-CM | POA: Insufficient documentation

## 2013-09-15 DIAGNOSIS — E1351 Other specified diabetes mellitus with diabetic peripheral angiopathy without gangrene: Secondary | ICD-10-CM | POA: Insufficient documentation

## 2013-09-15 DIAGNOSIS — E7800 Pure hypercholesterolemia, unspecified: Secondary | ICD-10-CM | POA: Insufficient documentation

## 2013-09-15 DIAGNOSIS — Z87891 Personal history of nicotine dependence: Secondary | ICD-10-CM | POA: Insufficient documentation

## 2013-09-15 DIAGNOSIS — Z8673 Personal history of transient ischemic attack (TIA), and cerebral infarction without residual deficits: Secondary | ICD-10-CM | POA: Insufficient documentation

## 2013-09-15 DIAGNOSIS — E1142 Type 2 diabetes mellitus with diabetic polyneuropathy: Secondary | ICD-10-CM | POA: Insufficient documentation

## 2013-09-15 DIAGNOSIS — E1149 Type 2 diabetes mellitus with other diabetic neurological complication: Secondary | ICD-10-CM | POA: Insufficient documentation

## 2013-09-15 DIAGNOSIS — R739 Hyperglycemia, unspecified: Secondary | ICD-10-CM

## 2013-09-15 DIAGNOSIS — E1159 Type 2 diabetes mellitus with other circulatory complications: Secondary | ICD-10-CM | POA: Insufficient documentation

## 2013-09-15 DIAGNOSIS — Z7902 Long term (current) use of antithrombotics/antiplatelets: Secondary | ICD-10-CM | POA: Insufficient documentation

## 2013-09-15 DIAGNOSIS — I10 Essential (primary) hypertension: Secondary | ICD-10-CM | POA: Insufficient documentation

## 2013-09-15 DIAGNOSIS — G40909 Epilepsy, unspecified, not intractable, without status epilepticus: Secondary | ICD-10-CM | POA: Insufficient documentation

## 2013-09-15 DIAGNOSIS — I798 Other disorders of arteries, arterioles and capillaries in diseases classified elsewhere: Secondary | ICD-10-CM | POA: Insufficient documentation

## 2013-09-15 DIAGNOSIS — Z880 Allergy status to penicillin: Secondary | ICD-10-CM | POA: Insufficient documentation

## 2013-09-15 DIAGNOSIS — I96 Gangrene, not elsewhere classified: Secondary | ICD-10-CM | POA: Insufficient documentation

## 2013-09-15 LAB — COMPREHENSIVE METABOLIC PANEL
ALK PHOS: 111 U/L (ref 39–117)
ALT: 8 U/L (ref 0–53)
ANION GAP: 11 (ref 5–15)
AST: 9 U/L (ref 0–37)
Albumin: 2.1 g/dL — ABNORMAL LOW (ref 3.5–5.2)
BUN: 21 mg/dL (ref 6–23)
CO2: 26 meq/L (ref 19–32)
Calcium: 8.3 mg/dL — ABNORMAL LOW (ref 8.4–10.5)
Chloride: 95 mEq/L — ABNORMAL LOW (ref 96–112)
Creatinine, Ser: 1.42 mg/dL — ABNORMAL HIGH (ref 0.50–1.35)
GFR calc Af Amer: 62 mL/min — ABNORMAL LOW (ref >90)
GFR calc non Af Amer: 53 mL/min — ABNORMAL LOW (ref >90)
GLUCOSE: 471 mg/dL — AB (ref 70–99)
POTASSIUM: 4.3 meq/L (ref 3.7–5.3)
Sodium: 132 mEq/L — ABNORMAL LOW (ref 137–147)
TOTAL PROTEIN: 6.5 g/dL (ref 6.0–8.3)
Total Bilirubin: <0.2 mg/dL — ABNORMAL LOW (ref 0.3–1.2)

## 2013-09-15 LAB — CBC WITH DIFFERENTIAL/PLATELET
Basophils Absolute: 0 10*3/uL (ref 0.0–0.1)
Basophils Relative: 0 % (ref 0–1)
EOS ABS: 0.1 10*3/uL (ref 0.0–0.7)
Eosinophils Relative: 1 % (ref 0–5)
HCT: 36 % — ABNORMAL LOW (ref 39.0–52.0)
HEMOGLOBIN: 12.2 g/dL — AB (ref 13.0–17.0)
LYMPHS ABS: 1.7 10*3/uL (ref 0.7–4.0)
LYMPHS PCT: 15 % (ref 12–46)
MCH: 26.9 pg (ref 26.0–34.0)
MCHC: 33.9 g/dL (ref 30.0–36.0)
MCV: 79.3 fL (ref 78.0–100.0)
MONOS PCT: 6 % (ref 3–12)
Monocytes Absolute: 0.7 10*3/uL (ref 0.1–1.0)
NEUTROS PCT: 78 % — AB (ref 43–77)
Neutro Abs: 8.9 10*3/uL — ABNORMAL HIGH (ref 1.7–7.7)
Platelets: 425 10*3/uL — ABNORMAL HIGH (ref 150–400)
RBC: 4.54 MIL/uL (ref 4.22–5.81)
RDW: 12.8 % (ref 11.5–15.5)
WBC: 11.4 10*3/uL — AB (ref 4.0–10.5)

## 2013-09-15 LAB — CBG MONITORING, ED
GLUCOSE-CAPILLARY: 488 mg/dL — AB (ref 70–99)
GLUCOSE-CAPILLARY: 396 mg/dL — AB (ref 70–99)

## 2013-09-15 LAB — SEDIMENTATION RATE: Sed Rate: 110 mm/hr — ABNORMAL HIGH (ref 0–16)

## 2013-09-15 MED ORDER — CLINDAMYCIN PHOSPHATE 900 MG/50ML IV SOLN
900.0000 mg | Freq: Once | INTRAVENOUS | Status: AC
  Administered 2013-09-15: 900 mg via INTRAVENOUS
  Filled 2013-09-15: qty 50

## 2013-09-15 MED ORDER — DEXTROSE 5 % IV SOLN
INTRAVENOUS | Status: AC
  Filled 2013-09-15: qty 1

## 2013-09-15 MED ORDER — SODIUM CHLORIDE 0.9 % IV SOLN
INTRAVENOUS | Status: DC
  Administered 2013-09-15: 4.1 [IU]/h via INTRAVENOUS
  Filled 2013-09-15: qty 1

## 2013-09-15 MED ORDER — AZTREONAM 1 G IJ SOLR
1.0000 g | Freq: Once | INTRAMUSCULAR | Status: AC
  Administered 2013-09-15: 1 g via INTRAVENOUS
  Filled 2013-09-15: qty 1

## 2013-09-15 MED ORDER — SODIUM CHLORIDE 0.9 % IV SOLN
1000.0000 mL | Freq: Once | INTRAVENOUS | Status: AC
  Administered 2013-09-15: 1000 mL via INTRAVENOUS

## 2013-09-15 MED ORDER — SODIUM CHLORIDE 0.9 % IV SOLN
INTRAVENOUS | Status: DC
  Administered 2013-09-15: 22:00:00 via INTRAVENOUS

## 2013-09-15 MED ORDER — VANCOMYCIN HCL IN DEXTROSE 1-5 GM/200ML-% IV SOLN
1000.0000 mg | Freq: Once | INTRAVENOUS | Status: AC
  Administered 2013-09-15: 1000 mg via INTRAVENOUS
  Filled 2013-09-15: qty 200

## 2013-09-15 NOTE — ED Notes (Signed)
Small toe on right foot blackened.  Necrotic area to outer aspect of right foot. Pt reports "not feeling well" since past Tuesday.

## 2013-09-15 NOTE — ED Notes (Signed)
My little toe on my right foot is completely black and has an odor per pt. Right lower leg and foot red.

## 2013-09-15 NOTE — ED Provider Notes (Addendum)
CSN: [id number]     Arrival date & time 09/15/13  1922 History   First MD Initiated Contact with Patient 09/15/13 1929     Chief Complaint  Patient presents with  . Wound Infection  . Cellulitis     (Consider location/radiation/quality/duration/timing/severity/associated sxs/prior Treatment) HPI Patient reports 3 days ago he was taking a shower and his right little toe was itching and he scratched it. He states he started getting swelling and discoloration of that toe the same day. He states his toe turned black later that same day. He denies any fevers or chills. He states it has been draining bloody fluid. His sister states she noted it smelled bad today however she has not seen them since the initial day he injured it. He states he has had chronic redness of his right lower leg for a long time. Patient has had diabetes for 37 years. However he does not check his blood sugars at all. He states he just takes the insulin they tell him to take every day.  PCP [facility] in [place]  Past Medical History  Diagnosis Date  . Diabetes mellitus   . Diabetic neuropathy   . Seizures   . Hypertension   . Acid reflux   . Hypercholesteremia   . Peripheral vascular disease due to secondary diabetes mellitus   . CVA (cerebral infarction)   . Carotid stenosis     s/p CEA on the left  . Glaucoma    Past Surgical History  Procedure Laterality Date  . Eye surgery    . Left femoral to [facility] bypass  [facility]16[facility]  . Amputation left great toe  02/2012    [facility]  . Cea      On the left   History reviewed. No pertinent family history. History  Substance Use Topics  . Smoking status: Former Games developer  . Smokeless tobacco: Not on file  . Alcohol Use: No  lives at home Lives alone  Review of Systems  All other systems reviewed and are negative.     Allergies  Flagyl and Penicillins  Home Medications   Prior to Admission medications   Medication Sig Start Date End  Date Taking? Authorizing Provider  acetaminophen (TYLENOL) 325 MG tablet Take 325 mg by mouth 2 (two) times daily.   Yes Historical Provider, MD  amitriptyline (ELAVIL) 25 MG tablet Take 25 mg by mouth at bedtime.   Yes Historical Provider, MD  atorvastatin (LIPITOR) 80 MG tablet Take 80 mg by mouth daily.   Yes Historical Provider, MD  cholecalciferol (VITAMIN D) 400 UNITS TABS tablet Take 800 Units by mouth daily.   Yes Historical Provider, MD  clopidogrel (PLAVIX) 75 MG tablet Take 75 mg by mouth daily.   Yes Historical Provider, MD  gabapentin (NEURONTIN) 300 MG capsule Take 900 mg by mouth 2 (two) times daily.   Yes Historical Provider, MD  HYDROcodone-acetaminophen (NORCO/VICODIN) 5-325 MG per tablet Take 1 tablet by mouth every 6 (six) hours as needed. pain   Yes Historical Provider, MD  levETIRAcetam (KEPPRA) 750 MG tablet Take 750 mg by mouth 2 (two) times daily.   Yes Historical Provider, MD  losartan (COZAAR) 50 MG tablet Take 50 mg by mouth daily.   Yes Historical Provider, MD  magnesium oxide (MAG-OX) 400 MG tablet Take 800 mg by mouth daily.   Yes Historical Provider, MD  metoprolol (LOPRESSOR) 50 MG tablet Take 50 mg by mouth 2 (two) times daily.  Yes Historical Provider, MD  omeprazole (PRILOSEC) 20 MG capsule Take 40 mg by mouth daily.   Yes Historical Provider, MD   BP 178/90  Pulse 102  Temp(Src) 99.7 F (37.6 C) (Oral)  Resp 20  Ht 5\' 9"  (1.753 m)  Wt 184 lb (83.462 kg)  BMI 27.16 kg/m2  SpO2 98%  Vital signs normal   Physical Exam  Nursing note and vitals reviewed. Constitutional: He is oriented to person, place, and time. He appears well-developed and well-nourished.  Non-toxic appearance. He does not appear ill. No distress.  HENT:  Head: Normocephalic and atraumatic.  Right Ear: External ear normal.  Left Ear: External ear normal.  Nose: Nose normal. No mucosal edema or rhinorrhea.  Mouth/Throat: Oropharynx is clear and moist and mucous membranes are normal.  No dental abscesses or uvula swelling.  Eyes: Conjunctivae and EOM are normal. Pupils are equal, round, and reactive to light.  Neck: Normal range of motion and full passive range of motion without pain. Neck supple.  Cardiovascular: Normal rate, regular rhythm and normal heart sounds.  Exam reveals no gallop and no friction rub.   No murmur heard. Pulmonary/Chest: Effort normal and breath sounds normal. No respiratory distress. He has no wheezes. He has no rhonchi. He has no rales. He exhibits no tenderness and no crepitus.  Abdominal: Soft. Normal appearance and bowel sounds are normal. He exhibits no distension. There is no tenderness. There is no rebound and no guarding.  Musculoskeletal: Normal range of motion. He exhibits no edema and no tenderness.  Pt is s/p left AKA, Pt has a little black toe with black on the lateral aspect of his distal right foot. He has redness and swelling of his right foot extending up his leg that he states is old.   Neurological: He is alert and oriented to person, place, and time. He has normal strength. No cranial nerve deficit.  Skin: Skin is warm, dry and intact. No rash noted. No erythema. No pallor.  Psychiatric: He has a normal mood and affect. His speech is normal and behavior is normal. His mood appears not anxious.         ED Course  Procedures (including critical care time)  Medications  0.9 %  sodium chloride infusion (not administered)  insulin regular (NOVOLIN R,HUMULIN R) 1 Units/mL in sodium chloride 0.9 % 100 mL infusion (not administered)  0.9 %  sodium chloride infusion (1,000 mLs Intravenous New Bag/Given 09/15/13 2105)  vancomycin (VANCOCIN) IVPB 1000 mg/200 mL premix (1,000 mg Intravenous New Bag/Given 09/15/13 2145)  aztreonam (AZACTAM) 1 g in dextrose 5 % 50 mL IVPB (not administered)  clindamycin (CLEOCIN) IVPB 900 mg (0 mg Intravenous Stopped 09/15/13 2142)   Pt requesting to be taken care of at the [facility] during my initial  interview.   I started filling out the paperwork for patient be transferred to the [facility] per his request.  Patient was started on IV antibiotics for his infected foot. He is right little toe is already necrotic as is the distal portion of his lateral foot.  Paper work was filled out to request transfer to the [facility] in [place]. The papers were faxed.   22:00 Pt left at change of shift to  hopefully arrange transfer to the [facility] in [place].   Labs Review Results for orders placed during the hospital encounter of 09/15/13  CBC WITH DIFFERENTIAL      Result Value Ref Range   WBC 11.4 (*) 4.0 - 10.5  K/uL   RBC 4.54  4.22 - 5.81 MIL/uL   Hemoglobin 12.2 (*) 13.0 - 17.0 g/dL   HCT 16.136.0 (*) 09.639.0 - 04.552.0 %   MCV 79.3  78.0 - 100.0 fL   MCH 26.9  26.0 - 34.0 pg   MCHC 33.9  30.0 - 36.0 g/dL   RDW 40.912.8  81.111.5 - 91.415.5 %   Platelets 425 (*) 150 - 400 K/uL   Neutrophils Relative % 78 (*) 43 - 77 %   Neutro Abs 8.9 (*) 1.7 - 7.7 K/uL   Lymphocytes Relative 15  12 - 46 %   Lymphs Abs 1.7  0.7 - 4.0 K/uL   Monocytes Relative 6  3 - 12 %   Monocytes Absolute 0.7  0.1 - 1.0 K/uL   Eosinophils Relative 1  0 - 5 %   Eosinophils Absolute 0.1  0.0 - 0.7 K/uL   Basophils Relative 0  0 - 1 %   Basophils Absolute 0.0  0.0 - 0.1 K/uL  COMPREHENSIVE METABOLIC PANEL      Result Value Ref Range   Sodium 132 (*) 137 - 147 mEq/L   Potassium 4.3  3.7 - 5.3 mEq/L   Chloride 95 (*) 96 - 112 mEq/L   CO2 26  19 - 32 mEq/L   Glucose, Bld 471 (*) 70 - 99 mg/dL   BUN 21  6 - 23 mg/dL   Creatinine, Ser 7.821.42 (*) 0.50 - 1.35 mg/dL   Calcium 8.3 (*) 8.4 - 10.5 mg/dL   Total Protein 6.5  6.0 - 8.3 g/dL   Albumin 2.1 (*) 3.5 - 5.2 g/dL   AST 9  0 - 37 U/L   ALT 8  0 - 53 U/L   Alkaline Phosphatase 111  39 - 117 U/L   Total Bilirubin <0.2 (*) 0.3 - 1.2 mg/dL   GFR calc non Af Amer 53 (*) >90 mL/min   GFR calc Af Amer 62 (*) >90 mL/min   Anion gap 11  5 - 15  CBG MONITORING, ED      Result Value Ref Range    Glucose-Capillary 488 (*) 70 - 99 mg/dL   Laboratory interpretation all normal except leukocytosis, hyperglycemia, mild anemia, hyperglyclemia without acidosis, renal insufficiency, malnutrition     Imaging Review Dg Foot Complete Right  09/15/2013   CLINICAL DATA:  Diabetic patient with right lower leg and foot redness and swelling. Soft tissues about the distal fifth metatarsal appear black.  EXAM: RIGHT FOOT COMPLETE - 3+ VIEW  COMPARISON:  None.  FINDINGS: Soft tissue gas is seen extending from the mid diaphysis of the fifth metatarsal into the fifth toe. No radiopaque foreign body is identified. No bony destructive change, fracture or periosteal reaction is identified.  IMPRESSION: Soft tissue gas about the distal fifth metatarsal and in the fifth toe. No plain film evidence of osteomyelitis is seen.   Electronically Signed   By: Drusilla Kannerhomas  Dalessio M.D.   On: 09/15/2013 20:56     EKG Interpretation None      MDM   Final diagnoses:  Hyperglycemia  Gangrene of toe    Plan admission   Devoria AlbeIva Sumiya Mamaril, MD, FACEP   CRITICAL CARE Performed by: Devoria AlbeKNAPP,Verdun Rackley L Total critical care time: 40 min Critical care time was exclusive of separately billable procedures and treating other patients. Critical care was necessary to treat or prevent imminent or life-threatening deterioration. Critical care was time spent personally by me on the following activities: development of treatment  plan with patient and/or surrogate as well as nursing, discussions with consultants, evaluation of patient's response to treatment, examination of patient, obtaining history from patient or surrogate, ordering and performing treatments and interventions, ordering and review of laboratory studies, ordering and review of radiographic studies, pulse oximetry and re-evaluation of patient's condition.     Ward Givens, MD 09/15/13 [id number]  Ward Givens, MD 09/16/13 1309

## 2013-09-16 DIAGNOSIS — E1142 Type 2 diabetes mellitus with diabetic polyneuropathy: Secondary | ICD-10-CM | POA: Diagnosis not present

## 2013-09-16 DIAGNOSIS — I96 Gangrene, not elsewhere classified: Secondary | ICD-10-CM | POA: Diagnosis not present

## 2013-09-16 DIAGNOSIS — E1149 Type 2 diabetes mellitus with other diabetic neurological complication: Secondary | ICD-10-CM | POA: Diagnosis not present

## 2013-09-16 DIAGNOSIS — E1159 Type 2 diabetes mellitus with other circulatory complications: Secondary | ICD-10-CM | POA: Diagnosis not present

## 2013-09-16 LAB — CBG MONITORING, ED
GLUCOSE-CAPILLARY: 253 mg/dL — AB (ref 70–99)
Glucose-Capillary: 309 mg/dL — ABNORMAL HIGH (ref 70–99)
Glucose-Capillary: 375 mg/dL — ABNORMAL HIGH (ref 70–99)

## 2013-09-16 NOTE — ED Notes (Signed)
[facility]

## 2013-09-16 NOTE — ED Notes (Signed)
[facility]

## 2013-09-16 NOTE — ED Notes (Signed)
[facility]

## 2013-09-16 NOTE — ED Provider Notes (Signed)
D/w dr Lovell Sheehanjenkins [facility]  Admit medicine with surgical consult  Joya Gaskinsonald W Lana Flaim, MD 09/16/13 [phone]

## 2013-09-16 NOTE — ED Provider Notes (Signed)
Pt [facility] Pt awake/alert, appears appropriate for transfer and not septic appearing He agrees to transfer  Joya Gaskinsonald W Jeannelle Wiens, MD 09/16/13 [phone]

## 2013-09-16 NOTE — ED Provider Notes (Signed)
The patient appears reasonably stabilized for transfer considering the current resources, flow, and capabilities available in the ED at this time, and I doubt any other [facility] requiring further screening and/or treatment in the ED prior to transfer.   Joya Gaskinsonald W Jacaria Colburn, MD 09/16/13 [phone]

## 2013-09-19 LAB — WOUND CULTURE: Gram Stain: NONE SEEN

## 2013-10-24 NOTE — Progress Notes (Signed)
Discharge Patient Details  Name: Ian Garcia MRN: [id number] Date of Birth: 31-Oct-1955  Today's Date: 10/24/2013  Patient discharged following completion of 10 visits and not returning for therapy since 08/01/13.  Homer Miller R 10/24/2013, 8:47 AM

## 2015-05-22 ENCOUNTER — Inpatient Hospital Stay (HOSPITAL_COMMUNITY)
Admission: EM | Admit: 2015-05-22 | Discharge: 2015-06-13 | DRG: 037 | Disposition: A | Discharge status: Skilled Nursing Facility | Payer: Non-veteran care | Attending: Neurology | Admitting: Neurology

## 2015-05-22 ENCOUNTER — Emergency Department (HOSPITAL_COMMUNITY): Payer: Non-veteran care

## 2015-05-22 ENCOUNTER — Encounter (HOSPITAL_COMMUNITY): Payer: Self-pay

## 2015-05-22 DIAGNOSIS — Z993 Dependence on wheelchair: Secondary | ICD-10-CM | POA: Diagnosis not present

## 2015-05-22 DIAGNOSIS — S0003XA Contusion of scalp, initial encounter: Secondary | ICD-10-CM | POA: Diagnosis present

## 2015-05-22 DIAGNOSIS — Z7902 Long term (current) use of antithrombotics/antiplatelets: Secondary | ICD-10-CM | POA: Diagnosis not present

## 2015-05-22 DIAGNOSIS — Z89611 Acquired absence of right leg above knee: Secondary | ICD-10-CM | POA: Insufficient documentation

## 2015-05-22 DIAGNOSIS — G40909 Epilepsy, unspecified, not intractable, without status epilepticus: Secondary | ICD-10-CM

## 2015-05-22 DIAGNOSIS — I959 Hypotension, unspecified: Secondary | ICD-10-CM

## 2015-05-22 DIAGNOSIS — R570 Cardiogenic shock: Secondary | ICD-10-CM | POA: Diagnosis not present

## 2015-05-22 DIAGNOSIS — E860 Dehydration: Secondary | ICD-10-CM | POA: Diagnosis not present

## 2015-05-22 DIAGNOSIS — I5021 Acute systolic (congestive) heart failure: Secondary | ICD-10-CM | POA: Diagnosis present

## 2015-05-22 DIAGNOSIS — E875 Hyperkalemia: Secondary | ICD-10-CM | POA: Diagnosis not present

## 2015-05-22 DIAGNOSIS — G8194 Hemiplegia, unspecified affecting left nondominant side: Secondary | ICD-10-CM | POA: Diagnosis present

## 2015-05-22 DIAGNOSIS — H409 Unspecified glaucoma: Secondary | ICD-10-CM | POA: Diagnosis present

## 2015-05-22 DIAGNOSIS — I639 Cerebral infarction, unspecified: Secondary | ICD-10-CM | POA: Diagnosis present

## 2015-05-22 DIAGNOSIS — K219 Gastro-esophageal reflux disease without esophagitis: Secondary | ICD-10-CM | POA: Diagnosis present

## 2015-05-22 DIAGNOSIS — Z720 Tobacco use: Secondary | ICD-10-CM | POA: Diagnosis present

## 2015-05-22 DIAGNOSIS — Z9119 Patient's noncompliance with other medical treatment and regimen: Secondary | ICD-10-CM

## 2015-05-22 DIAGNOSIS — D62 Acute posthemorrhagic anemia: Secondary | ICD-10-CM | POA: Diagnosis not present

## 2015-05-22 DIAGNOSIS — F17200 Nicotine dependence, unspecified, uncomplicated: Secondary | ICD-10-CM | POA: Diagnosis present

## 2015-05-22 DIAGNOSIS — E1151 Type 2 diabetes mellitus with diabetic peripheral angiopathy without gangrene: Secondary | ICD-10-CM | POA: Diagnosis present

## 2015-05-22 DIAGNOSIS — E1165 Type 2 diabetes mellitus with hyperglycemia: Secondary | ICD-10-CM | POA: Diagnosis present

## 2015-05-22 DIAGNOSIS — N183 Chronic kidney disease, stage 3 (moderate): Secondary | ICD-10-CM | POA: Diagnosis present

## 2015-05-22 DIAGNOSIS — I63511 Cerebral infarction due to unspecified occlusion or stenosis of right middle cerebral artery: Secondary | ICD-10-CM | POA: Diagnosis present

## 2015-05-22 DIAGNOSIS — F17210 Nicotine dependence, cigarettes, uncomplicated: Secondary | ICD-10-CM | POA: Diagnosis present

## 2015-05-22 DIAGNOSIS — R9401 Abnormal electroencephalogram [EEG]: Secondary | ICD-10-CM | POA: Diagnosis not present

## 2015-05-22 DIAGNOSIS — I5022 Chronic systolic (congestive) heart failure: Secondary | ICD-10-CM | POA: Diagnosis not present

## 2015-05-22 DIAGNOSIS — E1351 Other specified diabetes mellitus with diabetic peripheral angiopathy without gangrene: Secondary | ICD-10-CM | POA: Diagnosis not present

## 2015-05-22 DIAGNOSIS — E785 Hyperlipidemia, unspecified: Secondary | ICD-10-CM | POA: Diagnosis present

## 2015-05-22 DIAGNOSIS — R339 Retention of urine, unspecified: Secondary | ICD-10-CM | POA: Diagnosis not present

## 2015-05-22 DIAGNOSIS — I63411 Cerebral infarction due to embolism of right middle cerebral artery: Principal | ICD-10-CM | POA: Diagnosis present

## 2015-05-22 DIAGNOSIS — R05 Cough: Secondary | ICD-10-CM

## 2015-05-22 DIAGNOSIS — G936 Cerebral edema: Secondary | ICD-10-CM | POA: Diagnosis present

## 2015-05-22 DIAGNOSIS — IMO0002 Reserved for concepts with insufficient information to code with codable children: Secondary | ICD-10-CM | POA: Diagnosis present

## 2015-05-22 DIAGNOSIS — I5023 Acute on chronic systolic (congestive) heart failure: Secondary | ICD-10-CM | POA: Diagnosis not present

## 2015-05-22 DIAGNOSIS — R1314 Dysphagia, pharyngoesophageal phase: Secondary | ICD-10-CM | POA: Diagnosis present

## 2015-05-22 DIAGNOSIS — N179 Acute kidney failure, unspecified: Secondary | ICD-10-CM

## 2015-05-22 DIAGNOSIS — R059 Cough, unspecified: Secondary | ICD-10-CM

## 2015-05-22 DIAGNOSIS — I6521 Occlusion and stenosis of right carotid artery: Secondary | ICD-10-CM | POA: Diagnosis present

## 2015-05-22 DIAGNOSIS — E118 Type 2 diabetes mellitus with unspecified complications: Secondary | ICD-10-CM

## 2015-05-22 DIAGNOSIS — Z89511 Acquired absence of right leg below knee: Secondary | ICD-10-CM | POA: Diagnosis not present

## 2015-05-22 DIAGNOSIS — I429 Cardiomyopathy, unspecified: Secondary | ICD-10-CM | POA: Diagnosis not present

## 2015-05-22 DIAGNOSIS — R509 Fever, unspecified: Secondary | ICD-10-CM

## 2015-05-22 DIAGNOSIS — E1122 Type 2 diabetes mellitus with diabetic chronic kidney disease: Secondary | ICD-10-CM | POA: Diagnosis present

## 2015-05-22 DIAGNOSIS — I255 Ischemic cardiomyopathy: Secondary | ICD-10-CM | POA: Diagnosis present

## 2015-05-22 DIAGNOSIS — Z9114 Patient's other noncompliance with medication regimen: Secondary | ICD-10-CM

## 2015-05-22 DIAGNOSIS — T17908A Unspecified foreign body in respiratory tract, part unspecified causing other injury, initial encounter: Secondary | ICD-10-CM

## 2015-05-22 DIAGNOSIS — I130 Hypertensive heart and chronic kidney disease with heart failure and stage 1 through stage 4 chronic kidney disease, or unspecified chronic kidney disease: Secondary | ICD-10-CM | POA: Diagnosis present

## 2015-05-22 DIAGNOSIS — E1140 Type 2 diabetes mellitus with diabetic neuropathy, unspecified: Secondary | ICD-10-CM | POA: Diagnosis present

## 2015-05-22 DIAGNOSIS — Z89512 Acquired absence of left leg below knee: Secondary | ICD-10-CM | POA: Diagnosis not present

## 2015-05-22 DIAGNOSIS — I6529 Occlusion and stenosis of unspecified carotid artery: Secondary | ICD-10-CM

## 2015-05-22 DIAGNOSIS — Z89612 Acquired absence of left leg above knee: Secondary | ICD-10-CM | POA: Diagnosis not present

## 2015-05-22 DIAGNOSIS — I6350 Cerebral infarction due to unspecified occlusion or stenosis of unspecified cerebral artery: Secondary | ICD-10-CM | POA: Diagnosis not present

## 2015-05-22 DIAGNOSIS — Z452 Encounter for adjustment and management of vascular access device: Secondary | ICD-10-CM

## 2015-05-22 LAB — URINALYSIS, ROUTINE W REFLEX MICROSCOPIC
BILIRUBIN URINE: NEGATIVE
Leukocytes, UA: NEGATIVE
Nitrite: NEGATIVE
Specific Gravity, Urine: 1.025 (ref 1.005–1.030)
pH: 6 (ref 5.0–8.0)

## 2015-05-22 LAB — CBC WITH DIFFERENTIAL/PLATELET
BASOS ABS: 0.1 10*3/uL (ref 0.0–0.1)
Basophils Relative: 1 %
Eosinophils Absolute: 0.2 10*3/uL (ref 0.0–0.7)
Eosinophils Relative: 2 %
HCT: 45.9 % (ref 39.0–52.0)
Hemoglobin: 15.1 g/dL (ref 13.0–17.0)
Lymphocytes Relative: 20 %
Lymphs Abs: 2.1 10*3/uL (ref 0.7–4.0)
MCH: 26.9 pg (ref 26.0–34.0)
MCHC: 32.9 g/dL (ref 30.0–36.0)
MCV: 81.8 fL (ref 78.0–100.0)
Monocytes Absolute: 0.6 10*3/uL (ref 0.1–1.0)
Monocytes Relative: 6 %
NEUTROS ABS: 7.6 10*3/uL (ref 1.7–7.7)
Neutrophils Relative %: 71 %
Platelets: 330 10*3/uL (ref 150–400)
RBC: 5.61 MIL/uL (ref 4.22–5.81)
RDW: 14.5 % (ref 11.5–15.5)
WBC: 10.6 10*3/uL — ABNORMAL HIGH (ref 4.0–10.5)

## 2015-05-22 LAB — CBG MONITORING, ED: Glucose-Capillary: 229 mg/dL — ABNORMAL HIGH (ref 65–99)

## 2015-05-22 LAB — BASIC METABOLIC PANEL
ANION GAP: 7 (ref 5–15)
BUN: 18 mg/dL (ref 6–20)
CHLORIDE: 99 mmol/L — AB (ref 101–111)
CO2: 28 mmol/L (ref 22–32)
Calcium: 8.2 mg/dL — ABNORMAL LOW (ref 8.9–10.3)
Creatinine, Ser: 1.81 mg/dL — ABNORMAL HIGH (ref 0.61–1.24)
GFR calc Af Amer: 46 mL/min — ABNORMAL LOW (ref >60)
GFR calc non Af Amer: 40 mL/min — ABNORMAL LOW (ref >60)
GLUCOSE: 266 mg/dL — AB (ref 65–99)
POTASSIUM: 3.6 mmol/L (ref 3.5–5.1)
Sodium: 134 mmol/L — ABNORMAL LOW (ref 135–145)

## 2015-05-22 LAB — URINE MICROSCOPIC-ADD ON

## 2015-05-22 LAB — CREATININE, URINE, RANDOM: Creatinine, Urine: 164.23 mg/dL

## 2015-05-22 LAB — SODIUM, URINE, RANDOM: Sodium, Ur: 65 mmol/L

## 2015-05-22 MED ORDER — SODIUM CHLORIDE 0.9 % IV BOLUS (SEPSIS)
500.0000 mL | Freq: Once | INTRAVENOUS | Status: AC
  Administered 2015-05-22: 500 mL via INTRAVENOUS

## 2015-05-22 MED ORDER — HYDROCODONE-ACETAMINOPHEN 5-325 MG PO TABS
1.0000 | ORAL_TABLET | Freq: Four times a day (QID) | ORAL | Status: DC | PRN
  Administered 2015-05-22: 1 via ORAL
  Filled 2015-05-22: qty 1

## 2015-05-22 MED ORDER — INSULIN GLARGINE 100 UNIT/ML ~~LOC~~ SOLN
15.0000 [IU] | Freq: Every day | SUBCUTANEOUS | Status: DC
  Administered 2015-05-22: 15 [IU] via SUBCUTANEOUS
  Filled 2015-05-22 (×2): qty 0.15

## 2015-05-22 MED ORDER — FENTANYL CITRATE (PF) 100 MCG/2ML IJ SOLN
50.0000 ug | Freq: Once | INTRAMUSCULAR | Status: AC
  Administered 2015-05-22: 50 ug via INTRAVENOUS
  Filled 2015-05-22: qty 2

## 2015-05-22 NOTE — ED Provider Notes (Signed)
CSN: [id number]     Arrival date & time 05/22/15  1547 History   First MD Initiated Contact with Patient 05/22/15 1616     Chief Complaint  Patient presents with  . Fall     (Consider location/radiation/quality/duration/timing/severity/associated sxs/prior Treatment) HPI..... Level V caveat for urgent need for intervention. Patient allegedly accidentally fell out of his wheelchair yesterday. He has since complained of weakness in his left arm. No mental status changes. No confusion. No slurred speech. Right arm and bilateral AKA's have normal range of motion.   Patient is a long-term diabetic.  Past Medical History  Diagnosis Date  . Diabetes mellitus (HCC)   . Diabetic neuropathy (HCC)   . Seizures (HCC)   . Hypertension   . Acid reflux   . Hypercholesteremia   . Peripheral vascular disease due to secondary diabetes mellitus (HCC)   . CVA (cerebral infarction)   . Carotid stenosis     s/p CEA on the left  . Glaucoma    Past Surgical History  Procedure Laterality Date  . Eye surgery    . [facility] femoral [facility]  [facility]  . Amputation left great toe  02/2012    [facility]  . Cea      On the left   No family history on file. Social History  Substance Use Topics  . Smoking status: Current Every Day Smoker  . Smokeless tobacco: None  . Alcohol Use: No    Review of Systems  Reason unable to perform ROS: Urgent need for intervention.      Allergies  Flagyl and Penicillins  Home Medications   Prior to Admission medications   Medication Sig Start Date End Date Taking? Authorizing Provider  acetaminophen (TYLENOL) 325 MG tablet Take 325 mg by mouth 2 (two) times daily.   Yes Historical Provider, MD  amitriptyline (ELAVIL) 25 MG tablet Take 25 mg by mouth at bedtime.   Yes Historical Provider, MD  cholecalciferol (VITAMIN D) 400 UNITS TABS tablet Take 800 Units by mouth daily.   Yes Historical Provider, MD  clopidogrel (PLAVIX) 75  MG tablet Take 75 mg by mouth daily.   Yes Historical Provider, MD  gabapentin (NEURONTIN) 300 MG capsule Take 900 mg by mouth 2 (two) times daily.   Yes Historical Provider, MD  HYDROcodone-acetaminophen (NORCO/VICODIN) 5-325 MG per tablet Take 1 tablet by mouth every 6 (six) hours as needed. pain   Yes Historical Provider, MD  magnesium oxide (MAG-OX) 400 MG tablet Take 800 mg by mouth daily.   Yes Historical Provider, MD  metoprolol (LOPRESSOR) 50 MG tablet Take 50 mg by mouth 2 (two) times daily.   Yes Historical Provider, MD  atorvastatin (LIPITOR) 80 MG tablet Take 80 mg by mouth daily.    Historical Provider, MD  levETIRAcetam (KEPPRA) 750 MG tablet Take 750 mg by mouth 2 (two) times daily.    Historical Provider, MD  losartan (COZAAR) 50 MG tablet Take 50 mg by mouth daily.    Historical Provider, MD  omeprazole (PRILOSEC) 20 MG capsule Take 40 mg by mouth daily.    Historical Provider, MD   BP 146/83 mmHg  Pulse 91  Temp(Src) 98 F (36.7 C) (Temporal)  Resp 16  Ht 4\' 8"  (1.422 m)  Wt 160 lb (72.576 kg)  BMI 35.89 kg/m2  SpO2 97% Physical Exam  Constitutional: He is oriented to person, place, and time. He appears well-developed and well-nourished.  HENT:  Head: Normocephalic and atraumatic.  Eyes: Conjunctivae and EOM are normal. Pupils are equal, round, and reactive to light.  Neck: Normal range of motion. Neck supple.  Cardiovascular: Normal rate and regular rhythm.   Pulmonary/Chest: Effort normal and breath sounds normal.  Abdominal: Soft. Bowel sounds are normal.  Musculoskeletal: Normal range of motion.  Tenderness surrounding left shoulder. Bilateral AKA's.  Neurological: He is alert and oriented to person, place, and time.  Unable to move left arm  Skin: Skin is warm and dry.  Psychiatric: He has a normal mood and affect. His behavior is normal.  Nursing note and vitals reviewed.   ED Course  Procedures (including critical care time) Labs Review Labs Reviewed   CBC WITH DIFFERENTIAL/PLATELET - Abnormal; Notable for the following:    WBC 10.6 (*)    All other components within normal limits  BASIC METABOLIC PANEL - Abnormal; Notable for the following:    Sodium 134 (*)    Chloride 99 (*)    Glucose, Bld 266 (*)    Creatinine, Ser 1.81 (*)    Calcium 8.2 (*)    GFR calc non Af Amer 40 (*)    GFR calc Af Amer 46 (*)    All other components within normal limits    Imaging Review Dg Chest 2 View  05/22/2015  CLINICAL DATA:  [place] several times since Thursday, BILATERAL lower extremity amputee, diabetes mellitus, peripheral vascular disease, smoker EXAM: CHEST  2 VIEW COMPARISON:  09/18/2006 FINDINGS: Upper normal heart size. Mediastinal contours and pulmonary vascularity normal. Lungs clear. No pleural effusion or pneumothorax. Bones unremarkable. IMPRESSION: No acute abnormalities. Electronically Signed   By: Ulyses Southward M.D.   On: 05/22/2015 17:37   Ct Head Wo Contrast  05/22/2015  CLINICAL DATA:  Multiple recent falls. The patient now has left arm weakness. EXAM: CT HEAD WITHOUT CONTRAST CT CERVICAL SPINE WITHOUT CONTRAST TECHNIQUE: Multidetector CT imaging of the head and cervical spine was performed following the standard protocol without intravenous contrast. Multiplanar CT image reconstructions of the cervical spine were also generated. COMPARISON:  CT scan dated 11/19/2009 FINDINGS: CT HEAD FINDINGS There is a subacute nonhemorrhagic infarction high in the right parietal lobe with secondary edema and obliteration of adjacent cortical sulci. No midline shift. No hemorrhage. There is diffuse slight cerebral cortical atrophy. Old infarcts in the right frontoparietal region. No epidural or subdural or interventricular hemorrhage. No acute bone abnormality. Scalp contusions over the left occipital bone and high over the left posterior parietal region. CT CERVICAL SPINE FINDINGS There is no fracture or subluxation or prevertebral soft tissue swelling.  Soft disc protrusion with accompanying osteophytes central and to the left at C3-4. Broad-based disc protrusion with accompanying osteophytes at C5-6. Calcification in the right carotid bifurcation. Surgical clips around the left carotid bifurcation. IMPRESSION: 1. Subacute nonhemorrhagic infarction high in the right parietal lobe with secondary edema of that portion of the brain without midline shift. 2. Multiple small scalp contusions. 3. No acute abnormality of the cervical spine. Electronically Signed   By: Francene Boyers M.D.   On: 05/22/2015 17:25   Ct Cervical Spine Wo Contrast  05/22/2015  CLINICAL DATA:  Multiple recent falls. The patient now has left arm weakness. EXAM: CT HEAD WITHOUT CONTRAST CT CERVICAL SPINE WITHOUT CONTRAST TECHNIQUE: Multidetector CT imaging of the head and cervical spine was performed following the standard protocol without intravenous contrast. Multiplanar CT image reconstructions of the cervical spine were also generated. COMPARISON:  CT scan dated 11/19/2009  FINDINGS: CT HEAD FINDINGS There is a subacute nonhemorrhagic infarction high in the right parietal lobe with secondary edema and obliteration of adjacent cortical sulci. No midline shift. No hemorrhage. There is diffuse slight cerebral cortical atrophy. Old infarcts in the right frontoparietal region. No epidural or subdural or interventricular hemorrhage. No acute bone abnormality. Scalp contusions over the left occipital bone and high over the left posterior parietal region. CT CERVICAL SPINE FINDINGS There is no fracture or subluxation or prevertebral soft tissue swelling. Soft disc protrusion with accompanying osteophytes central and to the left at C3-4. Broad-based disc protrusion with accompanying osteophytes at C5-6. Calcification in the right carotid bifurcation. Surgical clips around the left carotid bifurcation. IMPRESSION: 1. Subacute nonhemorrhagic infarction high in the right parietal lobe with secondary  edema of that portion of the brain without midline shift. 2. Multiple small scalp contusions. 3. No acute abnormality of the cervical spine. Electronically Signed   By: Francene Boyers M.D.   On: 05/22/2015 17:25   Dg Shoulder Left  05/22/2015  CLINICAL DATA:  [place] several times since last Thursday and unable to raise left arm. EXAM: LEFT SHOULDER - 2+ VIEW COMPARISON:  None. FINDINGS: There is no evidence of fracture or dislocation. There is no evidence of arthropathy or other focal bone abnormality. Soft tissues are unremarkable. IMPRESSION: Negative. Electronically Signed   By: Kennith Center M.D.   On: 05/22/2015 17:37   I have personally reviewed and evaluated these images and lab results as part of my medical decision-making.   EKG Interpretation None      MDM   Final diagnoses:  Cerebral infarction due to unspecified mechanism    Patient is alert and oriented. CT head shows a subacute nonhemorrhagic infarct in the right parietal lobe. No midline shift.  This was discussed with the neurologist on call Dr. Gerilyn Pilgrim.  He will consult. Admit to general medicine. No aggressive interventions at this point.    Donnetta Hutching, MD 05/22/15 2042

## 2015-05-22 NOTE — H&P (Addendum)
[facility] History and Physical  Ian Garcia ZOX:[id number] DOB: 1955-05-20 DOA: 05/22/2015  Referring physician: Dr. Adriana Simas PCP: Earney Navy, MD   Chief Complaint:  Fall with bilateral shoulder pain  HPI:  60 year old male with history of uncontrolled diabetes mellitus with complications including diabetic neuropathy, peripheral vascular disease, gangrene and osteomyelitis of the leg status post bilateral below-knee amputations, wheelchair-bound, hypertension, seizure disorder, history of stroke, carotid stenosis status post left CEA, hyperlipidemia, glaucoma and ongoing tobacco use who was brought from home with acute left-sided weakness since yesterday. Patient reports that he was on the shower on his wheelchair when he started swelling to his left side and very likely was about to fall on the wheelchair. He held himself to the side and started having some weakness on his left arm and hand. It felt somewhat numb as well. He was then lying on his bed when he tried to pick his form from the site table but fell on the ground landed on his shoulders. He could not get up and was helped to get onto bed. He was still unable to move his left arm and left side of the face as well as the left arm had decreased sensations. Patient denies loss of consciousness, blurred vision, dizziness, headache, double vision, nausea, vomiting, chest pain, palpitations, shortness of breath, abdominal pain, bowel or urinary symptoms. His caregiver saw him today and thought he had some slurred speech as well. EMS was called and patient brought to the ED.  Vitals in the ED were normal. Blood work showed WBC of 10.6, normal hemoglobin and platelets. Chemistry showed sodium of 134, K of 3.6, chloride 99, CO2 of 28, BUN of 18 and creatinine 1.81 (last known creatinine in the system 2 years back 1.4), calcium of 8.6 and glucose of 266. CT of the head without contrast done showed subacute nonhemorrhagic infarct in the  right parietal lobe with secondary edema of the portion in the brain without midline shift. Also shows multiple small scalp contusions. CT of the cervical spine was unremarkable. Chest x-ray and x-ray of bilateral shoulders were unremarkable. Patient was out of the therapeutic window for IV tPA. Neurology Dr. Renard Hamper consulted by the ED physician who recommended admitting patient to hospice service and he would consult in the morning.  Review of Systems:  Constitutional: Denies fever, chills, diaphoresis, appetite change and fatigue.  HEENT: Denies photophobia, visual or hearing symptoms, difficulty swallowing, neck pain or stiffness Respiratory: Denies SOB, DOE, cough, chest tightness,  and wheezing.   Cardiovascular: Denies chest pain, palpitations and leg swelling.  Gastrointestinal: Denies nausea, vomiting, abdominal pain, diarrhea, constipation, blood in stool and abdominal distention.  Genitourinary: Denies dysuria, urgency, frequency, hematuria, flank pain and difficulty urinating.  Endocrine: Denies: hot or cold intolerance, sweats,polyuria, polydipsia. Musculoskeletal: Denies myalgias, back pain, joint swelling, arthralgias and gait problem.  Skin: Denies pallor, rash and wound.  Neurological: Left-sided weakness and numbness, Denies dizziness, seizures, syncope,  light-headedness,  and headaches.  Hematological: Denies adenopathy.  Psychiatric/Behavioral: Denies confusion   Past Medical History  Diagnosis Date  . Diabetes mellitus (HCC)   . Diabetic neuropathy (HCC)   . Seizures (HCC)   . Hypertension   . Acid reflux   . Hypercholesteremia   . Peripheral vascular disease due to secondary diabetes mellitus (HCC)   . CVA (cerebral infarction)   . Carotid stenosis     s/p CEA on the left  . Glaucoma    Past Surgical History  Procedure Laterality Date  .  Eye surgery    . Left femoral to tibial bypass  03/16/2011    [facility]  . Amputation left great toe  02/2012     [facility]  . Cea      On the left   Social History:  reports that he has been smoking.  He does not have any smokeless tobacco history on file. He reports that he does not drink alcohol or use illicit drugs.  Allergies  Allergen Reactions  . Flagyl [Metronidazole] Itching and Rash  . Penicillins Itching and Rash    No family history on file.  Prior to Admission medications   Medication Sig Start Date End Date Taking? Authorizing Provider  acetaminophen (TYLENOL) 325 MG tablet Take 325 mg by mouth 2 (two) times daily.   Yes Historical Provider, MD  amitriptyline (ELAVIL) 25 MG tablet Take 25 mg by mouth at bedtime.   Yes Historical Provider, MD  cholecalciferol (VITAMIN D) 400 UNITS TABS tablet Take 800 Units by mouth daily.   Yes Historical Provider, MD  clopidogrel (PLAVIX) 75 MG tablet Take 75 mg by mouth daily.   Yes Historical Provider, MD  gabapentin (NEURONTIN) 300 MG capsule Take 900 mg by mouth 2 (two) times daily.   Yes Historical Provider, MD  HYDROcodone-acetaminophen (NORCO/VICODIN) 5-325 MG per tablet Take 1 tablet by mouth every 6 (six) hours as needed. pain   Yes Historical Provider, MD  magnesium oxide (MAG-OX) 400 MG tablet Take 800 mg by mouth daily.   Yes Historical Provider, MD  metoprolol (LOPRESSOR) 50 MG tablet Take 50 mg by mouth 2 (two) times daily.   Yes Historical Provider, MD  atorvastatin (LIPITOR) 80 MG tablet Take 80 mg by mouth daily.    Historical Provider, MD  levETIRAcetam (KEPPRA) 750 MG tablet Take 750 mg by mouth 2 (two) times daily.    Historical Provider, MD  losartan (COZAAR) 50 MG tablet Take 50 mg by mouth daily.    Historical Provider, MD  omeprazole (PRILOSEC) 20 MG capsule Take 40 mg by mouth daily.    Historical Provider, MD     Physical Exam:  Filed Vitals:   05/22/15 1601 05/22/15 1947  BP: 162/62 146/83  Pulse: 86 91  Temp: 98 F (36.7 C)   TempSrc: Temporal   Resp: 15 16  Height:  (1.422 m)   Weight: 72.576 kg  (160 lb)   SpO2: 100% 97%    Constitutional: Vital signs reviewed.  Patient is a well-developed and well-nourished in no acute distress. HEENT: no pallor, no icterus, moist oral mucosa, no cervical lymphadenopathy Cardiovascular: RRR, S1 normal, S2 normal, no MRG Chest: CTAB, no wheezes, rales, or rhonchi Abdominal: Soft. Non-tender, non-distended, bowel sounds are normal, no masses, organomegaly, or guarding present.  GU: no CVA tenderness Ext: warm, no edema Neurological: A&O x3, non focal  Labs on Admission:  Basic Metabolic Panel:  Recent Labs Lab 05/22/15 1943  NA 134*  K 3.6  CL 99*  CO2 28  GLUCOSE 266*  BUN 18  CREATININE 1.81*  CALCIUM 8.2*   Liver Function Tests: No results for input(s): AST, ALT, ALKPHOS, BILITOT, PROT, ALBUMIN in the last 168 hours. No results for input(s): LIPASE, AMYLASE in the last 168 hours. No results for input(s): AMMONIA in the last 168 hours. CBC:  Recent Labs Lab 05/22/15 1943  WBC 10.6*  NEUTROABS 7.6  HGB 15.1  HCT 45.9  MCV 81.8  PLT 330   Cardiac Enzymes: No results for  input(s): CKTOTAL, CKMB, CKMBINDEX, TROPONINI in the last 168 hours. BNP: Invalid input(s): POCBNP CBG: No results for input(s): GLUCAP in the last 168 hours.  Radiological Exams on Admission: Dg Chest 2 View  05/22/2015  CLINICAL DATA:  De HollingsheadFallen several times since Thursday, BILATERAL lower extremity amputee, diabetes mellitus, peripheral vascular disease, smoker EXAM: CHEST  2 VIEW COMPARISON:  09/18/2006 FINDINGS: Upper normal heart size. Mediastinal contours and pulmonary vascularity normal. Lungs clear. No pleural effusion or pneumothorax. Bones unremarkable. IMPRESSION: No acute abnormalities. Electronically Signed   By: Ulyses SouthwardMark  Boles M.D.   On: 05/22/2015 17:37   Ct Head Wo Contrast  05/22/2015  CLINICAL DATA:  Multiple recent falls. The patient now has left arm weakness. EXAM: CT HEAD WITHOUT CONTRAST CT CERVICAL SPINE WITHOUT CONTRAST TECHNIQUE:  Multidetector CT imaging of the head and cervical spine was performed following the standard protocol without intravenous contrast. Multiplanar CT image reconstructions of the cervical spine were also generated. COMPARISON:  CT scan dated 11/19/2009 FINDINGS: CT HEAD FINDINGS There is a subacute nonhemorrhagic infarction high in the right parietal lobe with secondary edema and obliteration of adjacent cortical sulci. No midline shift. No hemorrhage. There is diffuse slight cerebral cortical atrophy. Old infarcts in the right frontoparietal region. No epidural or subdural or interventricular hemorrhage. No acute bone abnormality. Scalp contusions over the left occipital bone and high over the left posterior parietal region. CT CERVICAL SPINE FINDINGS There is no fracture or subluxation or prevertebral soft tissue swelling. Soft disc protrusion with accompanying osteophytes central and to the left at C3-4. Broad-based disc protrusion with accompanying osteophytes at C5-6. Calcification in the right carotid bifurcation. Surgical clips around the left carotid bifurcation. IMPRESSION: 1. Subacute nonhemorrhagic infarction high in the right parietal lobe with secondary edema of that portion of the brain without midline shift. 2. Multiple small scalp contusions. 3. No acute abnormality of the cervical spine. Electronically Signed   By: Francene BoyersJames  Maxwell M.D.   On: 05/22/2015 17:25   Ct Cervical Spine Wo Contrast  05/22/2015  CLINICAL DATA:  Multiple recent falls. The patient now has left arm weakness. EXAM: CT HEAD WITHOUT CONTRAST CT CERVICAL SPINE WITHOUT CONTRAST TECHNIQUE: Multidetector CT imaging of the head and cervical spine was performed following the standard protocol without intravenous contrast. Multiplanar CT image reconstructions of the cervical spine were also generated. COMPARISON:  CT scan dated 11/19/2009 FINDINGS: CT HEAD FINDINGS There is a subacute nonhemorrhagic infarction high in the right parietal  lobe with secondary edema and obliteration of adjacent cortical sulci. No midline shift. No hemorrhage. There is diffuse slight cerebral cortical atrophy. Old infarcts in the right frontoparietal region. No epidural or subdural or interventricular hemorrhage. No acute bone abnormality. Scalp contusions over the left occipital bone and high over the left posterior parietal region. CT CERVICAL SPINE FINDINGS There is no fracture or subluxation or prevertebral soft tissue swelling. Soft disc protrusion with accompanying osteophytes central and to the left at C3-4. Broad-based disc protrusion with accompanying osteophytes at C5-6. Calcification in the right carotid bifurcation. Surgical clips around the left carotid bifurcation. IMPRESSION: 1. Subacute nonhemorrhagic infarction high in the right parietal lobe with secondary edema of that portion of the brain without midline shift. 2. Multiple small scalp contusions. 3. No acute abnormality of the cervical spine. Electronically Signed   By: Francene BoyersJames  Maxwell M.D.   On: 05/22/2015 17:25   Dg Shoulder Left  05/22/2015  CLINICAL DATA:  De HollingsheadFallen several times since last Thursday and unable to raise left  arm. EXAM: LEFT SHOULDER - 2+ VIEW COMPARISON:  None. FINDINGS: There is no evidence of fracture or dislocation. There is no evidence of arthropathy or other focal bone abnormality. Soft tissues are unremarkable. IMPRESSION: Negative. Electronically Signed   By: Kennith Center M.D.   On: 05/22/2015 17:37    EKG: Pending  Assessment/Plan  Principal Problem:   Acute ischemic stroke [facility]) -Has residual right upper extremity hemiparesis with sensory deficits Admit to telemetry. Did not receive IV  tPA as he was out of therapeutic window. Has multiple risk factors including hypertension, hyperlipidemia, uncontrolled diabetes with peripheral vascular disease, ongoing tobacco use and prior history of stroke. Head CT results as above. Neuro checks every 2 -4 hours. Aspiration  and seizure precautions. Allow permissive blood pressure. Check MRI brain/MRA head. Check 2-D echo and carotid ultrasound. Check lipid panel and A1c.  -Patient on Plavix at home. Will add full dose aspirin. -Patient to swallow eval in the ED. PT/OT evaluation -Neurology will see patient in the morning.   Active Problems:   Type II diabetes mellitus with complication, uncontrolled (HCC) Patient reported that his CBG usually is in the range of 220-300. He reports being on insulin but I do not see on his MAR. Will place him on Lantus 15 units at bedtime along with sliding scale coverage. Check A1c.     Peripheral vascular disease due to secondary diabetes mellitus (HCC) Has bilateral below-knee amputations. Continue Plavix and statin.  Essential hypertension Continue metoprolol. We will losartan given his acute kidney injury.    Seizure disorder (HCC) Continue Keppra. Maintain seizure precautions.     Acute versus acute on chronic kidney injury (HCC) No recent baseline renal function in the system. Last known creatinine 2 years back was 1.42. Patient possibly could have underlying diabetic nephropathy. Hold losartan. Will check UA and urine lites. Check renal ultrasound. Monitor with gentle hydration.  Ongoing tobacco use Smokes one pack per day. Counseled strongly on cessation. Patient is not interested to quit.       Diet:cardiac/diabetic  DVT prophylaxis: sq heparin   Code Status: full code Family Communication: None at bedside Disposition Plan: Admit to telemetry  Akshita Italiano, [facility] Pager [phone]  Total time spent on admission :70 minutes  If 7PM-7AM, please contact night-coverage [email or web address] Password [email or web address] 05/22/2015, 9:10 PM

## 2015-05-22 NOTE — ED Notes (Signed)
Pt is able to raise left arm up but unable to hold up

## 2015-05-22 NOTE — ED Notes (Signed)
Pt reports has fallen several times since last Thursday.  Pt is bilateral aka.  Reports has fallen from wheel chair several times and has fallen out of bed twice. Pt says is unable to raise left arm since yesterday.  Pt can move arm but is very weak.

## 2015-05-23 ENCOUNTER — Inpatient Hospital Stay (HOSPITAL_COMMUNITY): Payer: Non-veteran care

## 2015-05-23 DIAGNOSIS — I429 Cardiomyopathy, unspecified: Secondary | ICD-10-CM | POA: Insufficient documentation

## 2015-05-23 DIAGNOSIS — Z89612 Acquired absence of left leg above knee: Secondary | ICD-10-CM

## 2015-05-23 DIAGNOSIS — R570 Cardiogenic shock: Secondary | ICD-10-CM | POA: Diagnosis not present

## 2015-05-23 DIAGNOSIS — I63411 Cerebral infarction due to embolism of right middle cerebral artery: Secondary | ICD-10-CM | POA: Diagnosis not present

## 2015-05-23 DIAGNOSIS — I63511 Cerebral infarction due to unspecified occlusion or stenosis of right middle cerebral artery: Secondary | ICD-10-CM | POA: Diagnosis present

## 2015-05-23 DIAGNOSIS — I5023 Acute on chronic systolic (congestive) heart failure: Secondary | ICD-10-CM | POA: Diagnosis not present

## 2015-05-23 DIAGNOSIS — I959 Hypotension, unspecified: Secondary | ICD-10-CM

## 2015-05-23 DIAGNOSIS — Z89611 Acquired absence of right leg above knee: Secondary | ICD-10-CM

## 2015-05-23 DIAGNOSIS — I639 Cerebral infarction, unspecified: Secondary | ICD-10-CM | POA: Insufficient documentation

## 2015-05-23 DIAGNOSIS — I6350 Cerebral infarction due to unspecified occlusion or stenosis of unspecified cerebral artery: Secondary | ICD-10-CM

## 2015-05-23 DIAGNOSIS — I9589 Other hypotension: Secondary | ICD-10-CM

## 2015-05-23 DIAGNOSIS — G936 Cerebral edema: Secondary | ICD-10-CM | POA: Diagnosis not present

## 2015-05-23 DIAGNOSIS — N179 Acute kidney failure, unspecified: Secondary | ICD-10-CM | POA: Insufficient documentation

## 2015-05-23 DIAGNOSIS — Z720 Tobacco use: Secondary | ICD-10-CM

## 2015-05-23 LAB — COMPREHENSIVE METABOLIC PANEL
ALK PHOS: 90 U/L (ref 38–126)
ALT: 13 U/L — ABNORMAL LOW (ref 17–63)
ANION GAP: 7 (ref 5–15)
AST: 18 U/L (ref 15–41)
Albumin: 2.1 g/dL — ABNORMAL LOW (ref 3.5–5.0)
BILIRUBIN TOTAL: 0.6 mg/dL (ref 0.3–1.2)
BUN: 18 mg/dL (ref 6–20)
CALCIUM: 7.9 mg/dL — AB (ref 8.9–10.3)
CO2: 27 mmol/L (ref 22–32)
Chloride: 104 mmol/L (ref 101–111)
Creatinine, Ser: 1.79 mg/dL — ABNORMAL HIGH (ref 0.61–1.24)
GFR, EST AFRICAN AMERICAN: 46 mL/min — AB (ref >60)
GFR, EST NON AFRICAN AMERICAN: 40 mL/min — AB (ref >60)
Glucose, Bld: 249 mg/dL — ABNORMAL HIGH (ref 65–99)
Potassium: 3.8 mmol/L (ref 3.5–5.1)
Sodium: 138 mmol/L (ref 135–145)
TOTAL PROTEIN: 5.7 g/dL — AB (ref 6.5–8.1)

## 2015-05-23 LAB — MRSA PCR SCREENING: MRSA BY PCR: NEGATIVE

## 2015-05-23 LAB — CBC
HCT: 41.8 % (ref 39.0–52.0)
HEMOGLOBIN: 13.4 g/dL (ref 13.0–17.0)
MCH: 26.6 pg (ref 26.0–34.0)
MCHC: 32.1 g/dL (ref 30.0–36.0)
MCV: 83.1 fL (ref 78.0–100.0)
Platelets: 278 10*3/uL (ref 150–400)
RBC: 5.03 MIL/uL (ref 4.22–5.81)
RDW: 14.4 % (ref 11.5–15.5)
WBC: 8.3 10*3/uL (ref 4.0–10.5)

## 2015-05-23 LAB — GLUCOSE, CAPILLARY
GLUCOSE-CAPILLARY: 89 mg/dL (ref 65–99)
Glucose-Capillary: 102 mg/dL — ABNORMAL HIGH (ref 65–99)
Glucose-Capillary: 147 mg/dL — ABNORMAL HIGH (ref 65–99)

## 2015-05-23 LAB — LIPID PANEL
CHOL/HDL RATIO: 9.5 ratio
Cholesterol: 295 mg/dL — ABNORMAL HIGH (ref 0–200)
HDL: 31 mg/dL — AB (ref >40)
LDL CALC: 206 mg/dL — AB (ref 0–99)
Triglycerides: 292 mg/dL — ABNORMAL HIGH (ref <150)
VLDL: 58 mg/dL — AB (ref 0–40)

## 2015-05-23 LAB — ECHOCARDIOGRAM COMPLETE
Height: 56 in
Weight: 2560 oz

## 2015-05-23 LAB — CBG MONITORING, ED
GLUCOSE-CAPILLARY: 205 mg/dL — AB (ref 65–99)
Glucose-Capillary: 104 mg/dL — ABNORMAL HIGH (ref 65–99)

## 2015-05-23 MED ORDER — GABAPENTIN 300 MG PO CAPS
900.0000 mg | ORAL_CAPSULE | Freq: Two times a day (BID) | ORAL | Status: DC
  Administered 2015-05-23 – 2015-06-13 (×40): 900 mg via ORAL
  Filled 2015-05-23 (×42): qty 3

## 2015-05-23 MED ORDER — CLOPIDOGREL BISULFATE 75 MG PO TABS: 75.0000 mg | ORAL_TABLET | Freq: Every day | ORAL | Status: DC

## 2015-05-23 MED ORDER — DOPAMINE-DEXTROSE 3.2-5 MG/ML-% IV SOLN
INTRAVENOUS | Status: AC
  Filled 2015-05-23: qty 250

## 2015-05-23 MED ORDER — HEPARIN SODIUM (PORCINE) 5000 UNIT/ML IJ SOLN
5000.0000 [IU] | Freq: Three times a day (TID) | INTRAMUSCULAR | Status: DC
  Administered 2015-05-23 – 2015-06-13 (×58): 5000 [IU] via SUBCUTANEOUS
  Filled 2015-05-23 (×56): qty 1

## 2015-05-23 MED ORDER — CLOPIDOGREL BISULFATE 75 MG PO TABS
75.0000 mg | ORAL_TABLET | Freq: Every day | ORAL | Status: DC
  Administered 2015-05-23 – 2015-06-12 (×19): 75 mg via ORAL
  Filled 2015-05-23 (×19): qty 1

## 2015-05-23 MED ORDER — MAGNESIUM OXIDE 400 (241.3 MG) MG PO TABS
800.0000 mg | ORAL_TABLET | Freq: Every day | ORAL | Status: DC
  Administered 2015-05-24 – 2015-06-13 (×19): 800 mg via ORAL
  Filled 2015-05-23 (×22): qty 2

## 2015-05-23 MED ORDER — ASPIRIN 325 MG PO TABS: 325.0000 mg | ORAL_TABLET | Freq: Every day | ORAL | Status: DC

## 2015-05-23 MED ORDER — INSULIN ASPART 100 UNIT/ML ~~LOC~~ SOLN: 0.0000 [IU] | Freq: Three times a day (TID) | SUBCUTANEOUS | Status: DC

## 2015-05-23 MED ORDER — SODIUM CHLORIDE 0.9 % IV BOLUS (SEPSIS)
250.0000 mL | Freq: Once | INTRAVENOUS | Status: AC
  Administered 2015-05-23: 250 mL via INTRAVENOUS

## 2015-05-23 MED ORDER — INSULIN ASPART 100 UNIT/ML ~~LOC~~ SOLN: 0.0000 [IU] | Freq: Every day | SUBCUTANEOUS | Status: DC

## 2015-05-23 MED ORDER — SODIUM CHLORIDE 0.9 % IV BOLUS (SEPSIS)
500.0000 mL | Freq: Once | INTRAVENOUS | Status: AC
  Administered 2015-05-23: 500 mL via INTRAVENOUS

## 2015-05-23 MED ORDER — ATORVASTATIN CALCIUM 80 MG PO TABS
80.0000 mg | ORAL_TABLET | Freq: Every day | ORAL | Status: DC
  Administered 2015-05-23 – 2015-06-12 (×19): 80 mg via ORAL
  Filled 2015-05-23 (×20): qty 1

## 2015-05-23 MED ORDER — AMITRIPTYLINE HCL 25 MG PO TABS
25.0000 mg | ORAL_TABLET | Freq: Every day | ORAL | Status: DC
  Administered 2015-05-23 – 2015-06-12 (×20): 25 mg via ORAL
  Filled 2015-05-23 (×22): qty 1

## 2015-05-23 MED ORDER — LEVETIRACETAM 750 MG PO TABS
750.0000 mg | ORAL_TABLET | Freq: Two times a day (BID) | ORAL | Status: DC
  Administered 2015-05-23 (×2): 750 mg via ORAL
  Filled 2015-05-23 (×2): qty 1

## 2015-05-23 MED ORDER — DOPAMINE-DEXTROSE 3.2-5 MG/ML-% IV SOLN
0.0000 ug/kg/min | INTRAVENOUS | Status: DC
  Administered 2015-05-23: 5 ug/kg/min via INTRAVENOUS

## 2015-05-23 MED ORDER — METOPROLOL TARTRATE 50 MG PO TABS
50.0000 mg | ORAL_TABLET | Freq: Two times a day (BID) | ORAL | Status: DC
  Administered 2015-05-23: 50 mg via ORAL
  Filled 2015-05-23: qty 1

## 2015-05-23 MED ORDER — STROKE: EARLY STAGES OF RECOVERY BOOK
Freq: Once | Status: DC
  Filled 2015-05-23: qty 1

## 2015-05-23 MED ORDER — ACETAMINOPHEN 325 MG PO TABS
325.0000 mg | ORAL_TABLET | Freq: Two times a day (BID) | ORAL | Status: DC
  Administered 2015-05-23 – 2015-05-26 (×7): 325 mg via ORAL
  Filled 2015-05-23 (×7): qty 1

## 2015-05-23 MED ORDER — SODIUM CHLORIDE 0.9 % IV SOLN: INTRAVENOUS | Status: DC

## 2015-05-23 MED ORDER — CHOLECALCIFEROL 10 MCG (400 UNIT) PO TABS
800.0000 [IU] | ORAL_TABLET | Freq: Every day | ORAL | Status: DC
  Administered 2015-05-24 – 2015-06-13 (×18): 800 [IU] via ORAL
  Filled 2015-05-23 (×41): qty 2

## 2015-05-23 MED ORDER — PANTOPRAZOLE SODIUM 40 MG PO TBEC
40.0000 mg | DELAYED_RELEASE_TABLET | Freq: Every day | ORAL | Status: DC
  Administered 2015-05-23 – 2015-06-02 (×10): 40 mg via ORAL
  Filled 2015-05-23 (×10): qty 1

## 2015-05-23 MED ORDER — HEPARIN SODIUM (PORCINE) 5000 UNIT/ML IJ SOLN
5000.0000 [IU] | Freq: Three times a day (TID) | INTRAMUSCULAR | Status: DC
  Administered 2015-05-23: 5000 [IU] via SUBCUTANEOUS
  Filled 2015-05-23: qty 1

## 2015-05-23 MED ORDER — INSULIN GLARGINE 100 UNIT/ML ~~LOC~~ SOLN
15.0000 [IU] | Freq: Every day | SUBCUTANEOUS | Status: DC
  Filled 2015-05-23: qty 0.15

## 2015-05-23 MED ORDER — ASPIRIN 325 MG PO TABS
325.0000 mg | ORAL_TABLET | Freq: Every day | ORAL | Status: DC
  Administered 2015-05-23 – 2015-06-13 (×20): 325 mg via ORAL
  Filled 2015-05-23 (×20): qty 1

## 2015-05-23 MED ORDER — SENNOSIDES-DOCUSATE SODIUM 8.6-50 MG PO TABS
1.0000 | ORAL_TABLET | Freq: Every evening | ORAL | Status: DC | PRN
  Administered 2015-06-06: 1 via ORAL
  Filled 2015-05-23: qty 1

## 2015-05-23 MED ORDER — SODIUM CHLORIDE 0.9 % IV SOLN
INTRAVENOUS | Status: DC
  Administered 2015-05-23: 09:00:00 via INTRAVENOUS

## 2015-05-23 MED ORDER — SODIUM CHLORIDE 0.9 % IV SOLN
INTRAVENOUS | Status: DC
  Administered 2015-05-23 – 2015-05-31 (×8): via INTRAVENOUS

## 2015-05-23 MED ORDER — LEVETIRACETAM 500 MG PO TABS: 750.0000 mg | ORAL_TABLET | Freq: Two times a day (BID) | ORAL | Status: DC

## 2015-05-23 MED ORDER — INSULIN ASPART 100 UNIT/ML ~~LOC~~ SOLN
0.0000 [IU] | SUBCUTANEOUS | Status: DC
  Administered 2015-05-23: 5 [IU] via SUBCUTANEOUS
  Administered 2015-05-24 (×3): 2 [IU] via SUBCUTANEOUS
  Administered 2015-05-25: 5 [IU] via SUBCUTANEOUS
  Administered 2015-05-25: 3 [IU] via SUBCUTANEOUS
  Administered 2015-05-25 (×2): 5 [IU] via SUBCUTANEOUS
  Administered 2015-05-25: 2 [IU] via SUBCUTANEOUS
  Administered 2015-05-26: 3 [IU] via SUBCUTANEOUS
  Administered 2015-05-26: 8 [IU] via SUBCUTANEOUS
  Administered 2015-05-26: 5 [IU] via SUBCUTANEOUS
  Administered 2015-05-26: 3 [IU] via SUBCUTANEOUS
  Administered 2015-05-27: 5 [IU] via SUBCUTANEOUS
  Administered 2015-05-27: 2 [IU] via SUBCUTANEOUS
  Administered 2015-05-27: 5 [IU] via SUBCUTANEOUS
  Administered 2015-05-27: 3 [IU] via SUBCUTANEOUS
  Administered 2015-05-27 (×2): 2 [IU] via SUBCUTANEOUS
  Administered 2015-05-28: 3 [IU] via SUBCUTANEOUS
  Administered 2015-05-28 (×2): 2 [IU] via SUBCUTANEOUS
  Administered 2015-05-28: 3 [IU] via SUBCUTANEOUS
  Administered 2015-05-28: 5 [IU] via SUBCUTANEOUS
  Administered 2015-05-28: 8 [IU] via SUBCUTANEOUS
  Administered 2015-05-29: 3 [IU] via SUBCUTANEOUS
  Administered 2015-05-29 (×3): 5 [IU] via SUBCUTANEOUS
  Administered 2015-05-29: 2 [IU] via SUBCUTANEOUS
  Administered 2015-05-29: 3 [IU] via SUBCUTANEOUS
  Administered 2015-05-30: 5 [IU] via SUBCUTANEOUS
  Administered 2015-05-30 (×2): 3 [IU] via SUBCUTANEOUS
  Administered 2015-05-30 (×2): 8 [IU] via SUBCUTANEOUS
  Administered 2015-05-30: 2 [IU] via SUBCUTANEOUS
  Administered 2015-05-31: 3 [IU] via SUBCUTANEOUS
  Administered 2015-05-31: 5 [IU] via SUBCUTANEOUS
  Administered 2015-06-01: 3 [IU] via SUBCUTANEOUS
  Administered 2015-06-01 (×3): 2 [IU] via SUBCUTANEOUS
  Administered 2015-06-01: 3 [IU] via SUBCUTANEOUS
  Administered 2015-06-02 (×2): 2 [IU] via SUBCUTANEOUS
  Administered 2015-06-02: 3 [IU] via SUBCUTANEOUS
  Administered 2015-06-02: 5 [IU] via SUBCUTANEOUS
  Administered 2015-06-02 – 2015-06-03 (×4): 3 [IU] via SUBCUTANEOUS
  Administered 2015-06-03 (×2): 2 [IU] via SUBCUTANEOUS
  Administered 2015-06-03: 5 [IU] via SUBCUTANEOUS
  Administered 2015-06-03: 3 [IU] via SUBCUTANEOUS
  Administered 2015-06-04: 2 [IU] via SUBCUTANEOUS
  Administered 2015-06-04: 5 [IU] via SUBCUTANEOUS
  Administered 2015-06-04: 3 [IU] via SUBCUTANEOUS
  Administered 2015-06-04: 5 [IU] via SUBCUTANEOUS
  Administered 2015-06-04: 3 [IU] via SUBCUTANEOUS
  Administered 2015-06-05: 5 [IU] via SUBCUTANEOUS
  Administered 2015-06-05 (×2): 3 [IU] via SUBCUTANEOUS
  Administered 2015-06-05 (×2): 5 [IU] via SUBCUTANEOUS
  Administered 2015-06-05 – 2015-06-06 (×2): 3 [IU] via SUBCUTANEOUS
  Administered 2015-06-06: 8 [IU] via SUBCUTANEOUS
  Administered 2015-06-06 (×2): 3 [IU] via SUBCUTANEOUS
  Administered 2015-06-06: 8 [IU] via SUBCUTANEOUS
  Administered 2015-06-06: 11 [IU] via SUBCUTANEOUS
  Administered 2015-06-07: 3 [IU] via SUBCUTANEOUS
  Administered 2015-06-07: 8 [IU] via SUBCUTANEOUS
  Administered 2015-06-07: 3 [IU] via SUBCUTANEOUS
  Administered 2015-06-07: 5 [IU] via SUBCUTANEOUS
  Administered 2015-06-07: 3 [IU] via SUBCUTANEOUS
  Administered 2015-06-07 – 2015-06-08 (×4): 5 [IU] via SUBCUTANEOUS
  Administered 2015-06-08 (×2): 3 [IU] via SUBCUTANEOUS
  Administered 2015-06-08: 15 [IU] via SUBCUTANEOUS
  Administered 2015-06-09 (×2): 3 [IU] via SUBCUTANEOUS
  Administered 2015-06-09: 5 [IU] via SUBCUTANEOUS
  Administered 2015-06-09 (×2): 3 [IU] via SUBCUTANEOUS
  Administered 2015-06-09 – 2015-06-10 (×2): 5 [IU] via SUBCUTANEOUS
  Administered 2015-06-10 (×3): 3 [IU] via SUBCUTANEOUS
  Administered 2015-06-10: 5 [IU] via SUBCUTANEOUS
  Administered 2015-06-10 – 2015-06-11 (×3): 3 [IU] via SUBCUTANEOUS
  Administered 2015-06-11: 5 [IU] via SUBCUTANEOUS
  Administered 2015-06-11: 2 [IU] via SUBCUTANEOUS
  Administered 2015-06-11: 3 [IU] via SUBCUTANEOUS
  Administered 2015-06-11: 2 [IU] via SUBCUTANEOUS
  Administered 2015-06-12: 8 [IU] via SUBCUTANEOUS
  Administered 2015-06-12: 3 [IU] via SUBCUTANEOUS
  Administered 2015-06-12 (×2): 2 [IU] via SUBCUTANEOUS
  Filled 2015-05-23: qty 1

## 2015-05-23 MED ORDER — ATORVASTATIN CALCIUM 80 MG PO TABS: 80.0000 mg | ORAL_TABLET | Freq: Every day | ORAL | Status: DC

## 2015-05-23 NOTE — ED Notes (Signed)
Pt to ultrasound

## 2015-05-23 NOTE — ED Notes (Signed)
Pt is to remain NPO at this time per Dr. Janee Mornhompson. Attempted to call MRI for STAT imaging, but no answer. Will try again shortly.

## 2015-05-23 NOTE — Consult Note (Signed)
PULMONARY / CRITICAL CARE MEDICINE   Name: Ian Garcia MRN: [id number] DOB: 10-Jan-1956    ADMISSION DATE:  05/22/2015 CONSULTATION DATE:  05/23/2015  REFERRING MD:  Dr. Lavon Paganini with neurology  CHIEF COMPLAINT:  Hypotension in setting of acute ischemic stroke  HISTORY OF PRESENT ILLNESS:   60 year old male with a past medical history significant for diabetes mellitus, vascular disease status post bilateral above-the-knee amputation came to the [facility] on 05/22/2014 complaining of new onset left-sided weakness which started the day prior to admission.He was transferred to [facility] on 05/23/2015 for a possible angiogram. Pulmonary and critical care medicine was consulted for persistent hypotension and request for assistance with vasopressors.  PAST MEDICAL HISTORY :  He  has a past medical history of Diabetes mellitus (HCC); Diabetic neuropathy (HCC); Seizures (HCC); Hypertension; Acid reflux; Hypercholesteremia; Peripheral vascular disease due to secondary diabetes mellitus (HCC); CVA (cerebral infarction); Carotid stenosis; and Glaucoma.  PAST SURGICAL HISTORY: He  has past surgical history that includes Eye surgery; Left femoral to tibial bypass (03/16/2011); Amputation left great toe (02/2012); and CEA.  Allergies  Allergen Reactions  . Flagyl [Metronidazole] Itching and Rash  . Penicillins Itching and Rash    No current facility-administered medications on file prior to encounter.   Current Outpatient Prescriptions on File Prior to Encounter  Medication Sig  . acetaminophen (TYLENOL) 325 MG tablet Take 325 mg by mouth 2 (two) times daily.  Marland Kitchen amitriptyline (ELAVIL) 25 MG tablet Take 25 mg by mouth at bedtime.  . cholecalciferol (VITAMIN D) 400 UNITS TABS tablet Take 800 Units by mouth daily.  . clopidogrel (PLAVIX) 75 MG tablet Take 75 mg by mouth daily.  Marland Kitchen gabapentin (NEURONTIN) 300 MG capsule Take 900 mg by mouth 2 (two) times daily.  Marland Kitchen  HYDROcodone-acetaminophen (NORCO/VICODIN) 5-325 MG per tablet Take 1 tablet by mouth every 6 (six) hours as needed. pain  . magnesium oxide (MAG-OX) 400 MG tablet Take 800 mg by mouth daily.  . metoprolol (LOPRESSOR) 50 MG tablet Take 50 mg by mouth 2 (two) times daily.  Marland Kitchen atorvastatin (LIPITOR) 80 MG tablet Take 80 mg by mouth daily.  Marland Kitchen levETIRAcetam (KEPPRA) 750 MG tablet Take 750 mg by mouth 2 (two) times daily.  Marland Kitchen losartan (COZAAR) 50 MG tablet Take 50 mg by mouth daily.  Marland Kitchen omeprazole (PRILOSEC) 20 MG capsule Take 40 mg by mouth daily.    FAMILY HISTORY:  His has no family status information on file.   SOCIAL HISTORY: He  reports that he has been smoking.  He does not have any smokeless tobacco history on file. He reports that he does not drink alcohol or use illicit drugs.  REVIEW OF SYSTEMS:   Unable to obtain, pt somnolent.  SUBJECTIVE:  As above  VITAL SIGNS: BP 77/58 mmHg  Pulse 48  Temp(Src) 98.1 F (36.7 C) (Oral)  Resp 9  Ht  (1.422 m)  Wt 72.576 kg (160 lb)  BMI 35.89 kg/m2  SpO2 98%  HEMODYNAMICS:    VENTILATOR SETTINGS:    INTAKE / OUTPUT:    PHYSICAL EXAMINATION: General: Adult male, resting in bed, in NAD. Neuro: Somnolent.  Flaccid on LUE and LLE. HEENT: [place]/AT. PERRL, sclerae anicteric. Cardiovascular: RRR, no M/R/G.  Lungs: Respirations even and unlabored.  CTA bilaterally, No W/R/R. Abdomen: BS x 4, soft, NT/ND.  Musculoskeletal: Bilateral AKA's, no edema.  Skin: Intact, warm, no rashes.    LABS:  BMET  Recent Labs Lab 05/22/15 1943 05/23/15 [phone]  NA 134* 138  K 3.6 3.8  CL 99* 104  CO2 28 27  BUN 18 18  CREATININE 1.81* 1.79*  GLUCOSE 266* 249*    Electrolytes  Recent Labs Lab 05/22/15 1943 05/23/15 0917  CALCIUM 8.2* 7.9*    CBC  Recent Labs Lab 05/22/15 1943 05/23/15 0917  WBC 10.6* 8.3  HGB 15.1 13.4  HCT 45.9 41.8  PLT 330 278    Coag's No results for input(s): APTT, INR in the last 168  hours.  Sepsis Markers No results for input(s): LATICACIDVEN, PROCALCITON, O2SATVEN in the last 168 hours.  ABG No results for input(s): PHART, PCO2ART, PO2ART in the last 168 hours.  Liver Enzymes  Recent Labs Lab 05/23/15 0917  AST 18  ALT 13*  ALKPHOS 90  BILITOT 0.6  ALBUMIN 2.1*    Cardiac Enzymes No results for input(s): TROPONINI, PROBNP in the last 168 hours.  Glucose  Recent Labs Lab 05/22/15 2149 05/23/15 1155 05/23/15 1455 05/23/15 1641  GLUCAP 229* 205* 104* 89    Imaging [name] Head Wo Contrast  05/23/2015  CLINICAL DATA:  60 year old male with altered mental status and left side weakness for 1 day. Agitation requiring exam discontinuation prior to completion. Initial encounter. EXAM: MRA HEAD WITHOUT CONTRAST TECHNIQUE: Angiographic images of the Circle of Willis were obtained using MRA technique without intravenous contrast. COMPARISON:  Motion degraded brain MRI from today reported separately. Noncontrast head CT 05/22/2015. FINDINGS: Study is moderately degraded by motion artifact despite repeated imaging attempts. Antegrade flow signal in the basilar artery. The basilar tip in proximal PCAs are patent with preserved flow signal. Antegrade flow signal in both ICA siphons. Both carotid termini appear patent with flow signal. Patent MCA and ACA origins. The right MCA M1 segment and bifurcation appear remain patent. Likewise, proximal left MCA and ACA is appear patent. Motion artifact otherwise degrades intracranial vessel detail. IMPRESSION: Motion degraded. No emergent large vessel occlusion to correspond with the acute posterior right MCA infarct. Electronically Signed   By: Odessa Fleming M.D.   On: 05/23/2015 11:23   Mr Brain Wo Contrast  05/23/2015  CLINICAL DATA:  60 year old male with altered mental status and left side weakness for 1 day. Agitation requiring exam discontinuation prior to completion. Initial encounter. EXAM: MRI HEAD WITHOUT CONTRAST TECHNIQUE:  Multiplanar, multiecho pulse sequences of the brain and surrounding structures were obtained without intravenous contrast. COMPARISON:  Head and cervical spine CT without contrast 05/22/2015. Brain MRI 11/20/2009. FINDINGS: Study is moderately to severely degraded by motion artifact despite repeated imaging attempts. Also the examination had to be discontinued prior to completion due to patient agitation. Only motion degraded axial diffusion, coronal diffusion, axial T2 imaging, and sagittal T1 weighted imaging was obtained 5-6 cm area of confluent restricted diffusion in the posterior superior right hemisphere, corresponding to posterior right MCA vascular territory. Several small areas of cortical restricted diffusion superimposed in the lateral right occipital lobe. No contralateral or posterior fossa restricted diffusion is evident. Basilar cisterns remain patent. No ventriculomegaly. Mild cytotoxic edema evident on T1 weighted imaging at this time. IMPRESSION: 1. Acute posterior right MCA territory infarct corresponding to the abnormal CT appearance yesterday. Cytotoxic edema without intracranial mass effect at this time. 2. Intracranial MRA from today reported separately. 3. Moderately to severely motion degraded and the examination had to be discontinued prior to completion due to patient agitation. 4. Consider surveillance for associated intracranial hemorrhage and mass effect with noncontrast CT. Electronically Signed   By: Rexene Edison  Margo AyeHall M.D.   On: 05/23/2015 11:21   [place] Renal  05/23/2015  CLINICAL DATA:  Acute renal failure. EXAM: RENAL / URINARY TRACT ULTRASOUND COMPLETE COMPARISON:  None. FINDINGS: Right Kidney: Length: 12.2 cm. Echogenic right kidney. No renal parenchymal atrophy. No renal mass. No hydronephrosis. Left Kidney: Length: 10.7 cm. Echogenic left kidney. No renal parenchymal atrophy. No renal mass. No hydronephrosis. Bladder: Both ureteral jets are demonstrated in the bladder lumen. Mild diffuse  bladder wall thickening and trabeculation. IMPRESSION: 1. No hydronephrosis. 2. Echogenic normal size kidneys, indicating nonspecific renal parenchymal disease of uncertain chronicity. 3. Mild diffuse bladder wall thickening and trabeculation, suggesting chronic bladder voiding dysfunction such as due to chronic bladder outlet obstruction by an enlarged prostate gland. The prostate gland is not evaluated on this scan. Electronically Signed   By: Delbert PhenixJason A Poff M.D.   On: 05/23/2015 08:36   [place] Carotid Bilateral  05/23/2015  CLINICAL DATA:  60 year old male with a history of prior stroke. Cardiovascular risk factors include hypertension, stroke/TIA, hyperlipidemia, diabetes, tobacco use. EXAM: BILATERAL CAROTID DUPLEX ULTRASOUND TECHNIQUE: Wallace CullensGray scale imaging, color Doppler and duplex ultrasound were performed of bilateral carotid and vertebral arteries in the neck. COMPARISON:  CT brain 05/22/2015, angiogram 11/22/2009 FINDINGS: Criteria: Quantification of carotid stenosis is based on velocity parameters that correlate the residual internal carotid diameter with NASCET-based stenosis levels, using the diameter of the distal internal carotid lumen as the denominator for stenosis measurement. The following velocity measurements were obtained: RIGHT ICA:  Systolic 63 cm/sec, Diastolic 31 cm/sec CCA:  521 cm/sec SYSTOLIC ICA/CCA RATIO:  0.1 ECA:  59 cm/sec LEFT ICA:  Systolic 98 cm/sec, Diastolic 27 cm/sec CCA:  103 cm/sec SYSTOLIC ICA/CCA RATIO:  1.0 ECA:  57 cm/sec Right Brachial SBP: Not acquired Left Brachial SBP: Not acquired RIGHT CAROTID ARTERY: Significant atherosclerotic changes of the right common carotid artery with intimal thickening. Antegrade flow with high resistant waveform in the mid common carotid. Maximum velocity 521 centimeter/second. There is heterogeneously hypoechoic plaque/ thrombus centralized within the lumen of the common carotid artery. Heterogeneous and partially calcified plaque at the  carotid bifurcation. Significantly decreased flow of the external carotid artery. Parvus tardus waveform of the internal carotid artery. RIGHT VERTEBRAL ARTERY: Antegrade flow with low resistance waveform. LEFT CAROTID ARTERY: Patient is status post left-sided carotid endarterectomy. Heterogeneous atherosclerotic changes of the left common carotid artery. Intermediate waveform maintained. Heterogeneous plaque/surgical changes of the carotid bifurcation. No significant shadowing across the lumen. ICA remains patent with low resistance antegrade waveform. High diastolic component on the left within the ICA. No significant tortuosity. LEFT VERTEBRAL ARTERY:  Antegrade flow with low resistance waveform. IMPRESSION: Right: Nearly occlusive centralized thrombus/plaque within the right common carotid artery which contributes to post stenotic and decreased antegrade flow of the right ICA. This plaque is favored to represent acute embolism/thrombus given the morphology. Formal cerebral angiogram is recommended. As a second best test, CTA. Left: The patient is status post left carotid endarterectomy in September 2011, with atherosclerotic changes of the common carotid artery. Established duplex criteria have not been validated for assessment after surgery, however, there is no evidence of compromised flow. High diastolic component of the flow on the left suggests increase contribution, potentially to the right hemisphere given the small infarction on recent CT. These results were called by telephone at the time of interpretation on 05/23/2015 at 9:09 am to Dr. Verdie MosherLiu, who verbally acknowledged these results. Signed, Yvone NeuJaime S. Loreta AveWagner, DO [facility] Electronically Signed  By: Gilmer Mor D.O.   On: 05/23/2015 09:10     STUDIES:  March 16 MRI brain: Acute posterior right MCA territory infarct corresponding to the abnormal CT scan performed one day prior, cytotoxic  edema without mass effect March 16 MRA brain: Motion degraded no emergent large vessel occlusion March 16 carotid ultrasound: Nearly occlusive centralized thrombus/plaque within the right common carotid artery, Favored to represent acute embolism formal cerebral angiogram recommended, status post left carotid endarterectomy March 16 Echo> LVEF 35%, diffuse hypokinesis with akinesis basal/inferiolateral wall, trival MR  CULTURES: none  ANTIBIOTICS: None  SIGNIFICANT EVENTS: 05/22/2014 admission 05/23/2015 transferred to [facility]  LINES/TUBES None  ASSESSMENT / PLAN: 60 year old male with a past medical history significant for prior stroke, significant peripheral vascular disease admitted with acute CVA symptoms since 3/14.  Brought to [facility] on 3/16 for cerebral angiogram given possible emboli in R common carotid artery.  PCCM consulted for vasopressors in setting of recent stroke and ongoing hypotension.   NEUROLOGIC A:   Acute Ischemic Stroke> symptoms started on 3/14. ?carotid artery occlusion > will defer decision on angiogram to neurology and IR, but given renal failure, baseline multiple comorbid illnesses would be cautious with administering contrast dye as it seems unclear what benefit the patient may receive 2 days after onset of stroke symptoms. P:   Post stroke management per neurology. As above.  CARDIOVASCULAR A:  Hypotension, due to cardiogenic shock?.  Echo from 03/16 revealed LVEF 35%, diffuse hypokinesis with akinesis basal/inferiolateral wall, trival MR. Systolic heart failure> new onset? - see echo report above. Baseline history of hypertension. HLD - total cholesterol 295, Trigs 292, HDL 31, LDL 206. P:  Will start dopamine for neurology's stated goal of SBP > 110. Tele monitoring. Needs cardiology consult, neurology to call today. STAT 12 lead EKG. Trend troponins.  PULMONARY A: No acute issues. P:   Monitor O2 saturation.  RENAL A:    Chronic kidney disease. Pseudohypocalcemia - corrects to 9.42. P:   Would NOT give contrast dye unless there is a clear benefit as risk of AKI and potentially needing dialysis is high. Assess ionized calcium. BMP in AM.  GASTROINTESTINAL A:   Nutrition. P:   NPO.  HEMATOLOGIC A:   VTE prophylaxis. P:  Heparin. CBC in AM.  INFECTIOUS A:   No indication of infection. P:   Monitor clinically.  ENDOCRINE A:   DM. P:   SSI.   FAMILY  - Updates: None.  - Inter-disciplinary family meet or Palliative Care meeting due by:  03/23.  CC time:  35 minutes.   Rutherford Guys, [place] - [facility] [place] [facility] Pager: [phone]  or [phone] 05/23/2015, 7:29 PM

## 2015-05-23 NOTE — ED Notes (Signed)
While unhooking patient for MRI, pt's IV found to be out. Pt needs STAT MRI. MRI can also obtain IV access if needed for scan. Will obtain another IV site upon patient's return to room.

## 2015-05-23 NOTE — Progress Notes (Signed)
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 CT head with subacute nonhemorrhagic infarction high in the right parietal lobe with secondary edema of the portion of the brain without midline shift. Carotid ultrasound with right nearly occlusive Center last thrombus/plaque within the right, carotid artery which contributes to poststenotic and decreased antegrade flow of the right ICA. Plaque is favored to represent acute embolism/thrombus given morphology. Left status post left CEA September 2011 with atherosclerotic changes of the common carotid artery. 2-D echo pending. MRI/MRA of the head pending. Patient not given IV TPA as he was outside the therapeutic window. Continue neuro checks. Seizure precautions. Permissive hypertension. Patient was on Plavix at home however states his not being fully compliant with all his medications. Will continue Plavix and aspirin which was added on admission. PT/OT/ST pending. Due to thrombus noted on carotid ultrasound patient will likely need a intervention and as such will transfer patient to [facility]. Will consult with neurology and interventional neurology for further evaluation and management. Spoke to Dr Lavon Paganini, neurology. Spoke with Dr. Corliss Skains of interventional neurology who recommended patient be transferred to [facility] for cerebral angiogram to be done today. Once patient gets to [facility] Dr. Corliss Skains of interventional radiology  will need to be notified at [phone].  #2 type 2 diabetes with neuropathy Hemoglobin A1c pending. Patient to be started on Lantus 15 units daily. Sliding scale insulin.  #3 peripheral vascular disease Status post bilateral BKA. Continue Plavix, statin.  #4 hypertension Metoprolol. ARB on hold secondary to possible acute renal failure.  #5 seizure disorder Continue Keppra. Seizure precautions.  #6 acute versus acute on chronic kidney disease No recent baseline renal function on Epic. Last creatinine 2 years ago was 1.42. ARB on hold. Check urine electrolytes. Renal ultrasound negative for hydronephrosis. Gentle hydration. Labs pending.  #7 tobacco abuse Tobacco cessation. Patient not interested in quitting at this time.  #8 prophylaxis Heparin for DVT prophylaxis.   Code Status: Full Family Communication: Updated patient. No family at bedside. Disposition Plan: Transfer to [facility]   Consultants:  Neurology pending  Procedures:  CT Head/c spine 05/22/2015  CXR 05/22/2015  Plain films left shoulder 05/22/2015  Carotid US 05/23/2015  Antibiotics:  None  HPI/Subjective: Patient states still with left sided weakness.No CP. No SOB.  Objective: Filed Vitals:   05/23/15 0830 05/23/15 0915  BP: 149/81   Pulse: 87   Temp:  98.2 F (36.8 C)  Resp: 16    No intake or output data in the 24 hours ending 05/23/15 0937 Filed Weights   05/22/15 1601  Weight: 72.576 kg (160 lb)    Exam:   General:  NAD  Cardiovascular: RRR  Respiratory: CTAB anterior lung fields  Abdomen: Soft/NT/ND/+BS  Musculoskeletal: No c/c/ s/p B BKA  Neurology: Alert. Cranial nerves II through XII grossly intact. Visual fields intact. Left upper extremity with 1-2/5 strength.5/5 right upper extremity strength.5/5 bilateral lower extremity strength.  Data Reviewed: Basic Metabolic Panel:  Recent Labs Lab 05/22/15 1943  NA 134*  K 3.6  CL 99*  CO2 28  GLUCOSE 266*  BUN 18   CREATININE 1.81*  CALCIUM 8.2*   Liver Function Tests: No results for input(s): AST, ALT, ALKPHOS, BILITOT, PROT, ALBUMIN in the last 168 hours. No results for input(s): LIPASE, AMYLASE in the last 168 hours. No results for input(s): AMMONIA in the last 168 hours. CBC:  Recent Labs Lab 05/22/15 1943  WBC 10.6*  NEUTROABS 7.6  HGB 15.1  HCT 45.9  MCV 81.8  PLT 330   Cardiac Enzymes: No results for input(s): CKTOTAL, CKMB, CKMBINDEX, TROPONINI in the last 168 hours. BNP (last 3 results) No results for input(s): BNP in the last 8760 hours.  ProBNP (last 3 results) No results for input(s): PROBNP in the last 8760 hours.  CBG:  Recent Labs Lab 05/22/15 2149  GLUCAP 229*    No results found for this or any previous visit (from the past 240 hour(s)).   Studies: Dg Chest 2 View  05/22/2015  CLINICAL DATA:  [place] several times since Thursday, BILATERAL lower extremity amputee, diabetes mellitus, peripheral vascular disease, smoker EXAM: CHEST  2 VIEW COMPARISON:  09/18/2006 FINDINGS: Upper normal heart size. Mediastinal contours and pulmonary vascularity normal. Lungs clear. No pleural effusion or pneumothorax. Bones unremarkable. IMPRESSION: No acute abnormalities. Electronically Signed   By: Ulyses Southward M.D.   On: 05/22/2015 17:37   Ct Head Wo Contrast  05/22/2015  CLINICAL DATA:  Multiple recent falls. The patient now has left arm weakness. EXAM: CT HEAD WITHOUT CONTRAST CT CERVICAL SPINE WITHOUT CONTRAST TECHNIQUE: Multidetector CT imaging of the head and cervical spine was performed following the standard protocol without intravenous contrast. Multiplanar CT image reconstructions of the cervical spine were also generated. COMPARISON:  CT scan dated 11/19/2009 FINDINGS: CT HEAD FINDINGS There is a subacute nonhemorrhagic infarction high in the right parietal lobe with secondary edema and obliteration of adjacent cortical sulci. No midline shift. No hemorrhage. There is diffuse  slight cerebral cortical atrophy. Old infarcts in the right frontoparietal region. No epidural or subdural or interventricular hemorrhage. No acute bone abnormality. Scalp contusions over the left occipital bone and high over the left posterior parietal region. CT CERVICAL SPINE FINDINGS There is no fracture or subluxation or prevertebral soft tissue swelling. Soft disc protrusion with accompanying osteophytes central and to the left at C3-4. Broad-based disc protrusion with accompanying osteophytes at C5-6. Calcification in the right carotid bifurcation. Surgical clips around the left carotid bifurcation. IMPRESSION: 1. Subacute nonhemorrhagic infarction high in the right parietal lobe with secondary edema of that portion of the brain without midline shift. 2. Multiple small scalp contusions. 3. No acute abnormality of the cervical spine. Electronically Signed   By: Francene Boyers M.D.   On: 05/22/2015 17:25   Ct Cervical Spine Wo Contrast  05/22/2015  CLINICAL DATA:  Multiple recent falls. The patient now has left arm weakness. EXAM: CT HEAD WITHOUT CONTRAST CT CERVICAL SPINE WITHOUT CONTRAST TECHNIQUE: Multidetector CT imaging of the head and cervical spine was performed following the standard protocol without intravenous contrast. Multiplanar CT image reconstructions of the cervical spine were also generated. COMPARISON:  CT scan dated 11/19/2009 FINDINGS: CT HEAD FINDINGS There is a subacute nonhemorrhagic infarction high in the right parietal lobe with secondary edema and obliteration of adjacent cortical sulci. No midline shift. No hemorrhage. There is diffuse slight cerebral cortical atrophy. Old infarcts in the right frontoparietal region. No epidural or subdural or interventricular hemorrhage. No acute bone abnormality. Scalp contusions over the left occipital bone and high over the left posterior parietal region.  CT CERVICAL SPINE FINDINGS There is no fracture or subluxation or prevertebral soft tissue  swelling. Soft disc protrusion with accompanying osteophytes central and to the left at C3-4. Broad-based disc protrusion with accompanying osteophytes at C5-6. Calcification in the right carotid bifurcation. Surgical clips around the left carotid bifurcation. IMPRESSION: 1. Subacute nonhemorrhagic infarction high in the right parietal lobe with secondary edema of that portion of the brain without midline shift. 2. Multiple small scalp contusions. 3. No acute abnormality of the cervical spine. Electronically Signed   By: Francene Boyers M.D.   On: 05/22/2015 17:25   US Renal  05/23/2015  CLINICAL DATA:  Acute renal failure. EXAM: RENAL / URINARY TRACT ULTRASOUND COMPLETE COMPARISON:  None. FINDINGS: Right Kidney: Length: 12.2 cm. Echogenic right kidney. No renal parenchymal atrophy. No renal mass. No hydronephrosis. Left Kidney: Length: 10.7 cm. Echogenic left kidney. No renal parenchymal atrophy. No renal mass. No hydronephrosis. Bladder: Both ureteral jets are demonstrated in the bladder lumen. Mild diffuse bladder wall thickening and trabeculation. IMPRESSION: 1. No hydronephrosis. 2. Echogenic normal size kidneys, indicating nonspecific renal parenchymal disease of uncertain chronicity. 3. Mild diffuse bladder wall thickening and trabeculation, suggesting chronic bladder voiding dysfunction such as due to chronic bladder outlet obstruction by an enlarged prostate gland. The prostate gland is not evaluated on this scan. Electronically Signed   By: Delbert Phenix M.D.   On: 05/23/2015 08:36   US Carotid Bilateral  05/23/2015  CLINICAL DATA:  60 year old male with a history of prior stroke. Cardiovascular risk factors include hypertension, stroke/TIA, hyperlipidemia, diabetes, tobacco use. EXAM: BILATERAL CAROTID DUPLEX ULTRASOUND TECHNIQUE: Wallace Cullens scale imaging, color Doppler and duplex ultrasound were performed of bilateral carotid and vertebral arteries in the neck. COMPARISON:  CT brain 05/22/2015, angiogram  11/22/2009 FINDINGS: Criteria: Quantification of carotid stenosis is based on velocity parameters that correlate the residual internal carotid diameter with NASCET-based stenosis levels, using the diameter of the distal internal carotid lumen as the denominator for stenosis measurement. The following velocity measurements were obtained: RIGHT ICA:  Systolic 63 cm/sec, Diastolic 31 cm/sec CCA:  521 cm/sec SYSTOLIC ICA/CCA RATIO:  0.1 ECA:  59 cm/sec LEFT ICA:  Systolic 98 cm/sec, Diastolic 27 cm/sec CCA:  103 cm/sec SYSTOLIC ICA/CCA RATIO:  1.0 ECA:  57 cm/sec Right Brachial SBP: Not acquired Left Brachial SBP: Not acquired RIGHT CAROTID ARTERY: Significant atherosclerotic changes of the right common carotid artery with intimal thickening. Antegrade flow with high resistant waveform in the mid common carotid. Maximum velocity 521 centimeter/second. There is heterogeneously hypoechoic plaque/ thrombus centralized within the lumen of the common carotid artery. Heterogeneous and partially calcified plaque at the carotid bifurcation. Significantly decreased flow of the external carotid artery. Parvus tardus waveform of the internal carotid artery. RIGHT VERTEBRAL ARTERY: Antegrade flow with low resistance waveform. LEFT CAROTID ARTERY: Patient is status post left-sided carotid endarterectomy. Heterogeneous atherosclerotic changes of the left common carotid artery. Intermediate waveform maintained. Heterogeneous plaque/surgical changes of the carotid bifurcation. No significant shadowing across the lumen. ICA remains patent with low resistance antegrade waveform. High diastolic component on the left within the ICA. No significant tortuosity. LEFT VERTEBRAL ARTERY:  Antegrade flow with low resistance waveform. IMPRESSION: Right: Nearly occlusive centralized thrombus/plaque within the right common carotid artery which contributes to post stenotic and decreased antegrade flow of the right ICA. This plaque is favored to  represent acute embolism/thrombus given the morphology. Formal cerebral angiogram is recommended. As a second best test, CTA. Left: The patient is status post left carotid  endarterectomy in September 2011, with atherosclerotic changes of the common carotid artery. Established duplex criteria have not been validated for assessment after surgery, however, there is no evidence of compromised flow. High diastolic component of the flow on the left suggests increase contribution, potentially to the right hemisphere given the small infarction on recent CT. These results were called by telephone at the time of interpretation on 05/23/2015 at 9:09 am to Dr. Verdie MosherLiu, who verbally acknowledged these results. Signed, Yvone NeuJaime S. Loreta AveWagner, DO [facility] Electronically Signed   By: Gilmer MorJaime  Wagner D.O.   On: 05/23/2015 09:10   Dg Shoulder Left  05/22/2015  CLINICAL DATA:  De Holl[place]headFallen several times since last Thursday and unable to raise left arm. EXAM: LEFT SHOULDER - 2+ VIEW COMPARISON:  None. FINDINGS: There is no evidence of fracture or dislocation. There is no evidence of arthropathy or other focal bone abnormality. Soft tissues are unremarkable. IMPRESSION: Negative. Electronically Signed   By: Kennith CenterEric  Mansell M.D.   On: 05/22/2015 17:37    Scheduled Meds: . insulin glargine  15 Units Subcutaneous QHS   Continuous Infusions: . sodium chloride 100 mL/hr at 05/23/15 [id number]    Principal Problem:   Acute ischemic stroke [facility](HCC) Active Problems:   Type II diabetes mellitus with complication, uncontrolled (HCC)   Peripheral vascular disease due to secondary diabetes mellitus (HCC)   Seizure disorder (HCC)   CVA (cerebral infarction)   Acute kidney injury (HCC)   Below knee amputation status (HCC)   Tobacco abuse    Time spent: 40 mins    [facility] Washington HospitalHOMPSON,DANIEL MD [facility] Pager [phone]. If 7PM-7AM, please contact night-coverage at [email or web address], password  [email or web address] 05/23/2015, 9:37 AM  LOS: 1 day

## 2015-05-23 NOTE — ED Notes (Signed)
Spoke with MRI. States they will get patient in 10-15 minutes.

## 2015-05-23 NOTE — ED Notes (Signed)
Per Dr. Janee Mornhompson, [facility], page IR MD at [phone]. Pass along in report.

## 2015-05-23 NOTE — ED Notes (Signed)
Phlebotomy at bedside.

## 2015-05-23 NOTE — ED Notes (Signed)
Permission to give PO medications with water given by Dr. Janee Mornhompson.

## 2015-05-23 NOTE — H&P (Signed)
HPI:                                                                                                                                         Ian Garcia is an 60 y.o. 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  He was admitted to the hospitalist service at [facility]. As part of the stroke workup he had ultrasound of the carotids done which showed a near occlusive thrombus in the right ICA. MRI of the brain was performed which was of poor quality due to motion artifacts. It Did show a posterior parietal acute right MCA infarct.  Due to the abnormality seen on the carotid ultrasound with a near occlusive acute thrombus, he was transferred to [facility] for further evaluation and management options including no any further interventions. His been transferred to the ICU. Patient is a poor historian. Reports living alone. No family available at bedside.  Date last known well:  05/21/2015 Time last known well:  Unknown tPA Given: No: Outside time window  Stroke Risk Factors - carotid stenosis, diabetes mellitus, hyperlipidemia, hypertension and smoking  Past Medical History: Past Medical History  Diagnosis Date  . Diabetes mellitus (HCC)   . Diabetic neuropathy (HCC)   . Seizures (HCC)   . Hypertension   . Acid reflux   . Hypercholesteremia   . Peripheral vascular disease due to secondary diabetes mellitus (HCC)   . CVA (cerebral infarction)   . Carotid stenosis     s/p CEA on the left  . Glaucoma     Past Surgical History   Procedure Laterality Date  . Eye surgery    . Left femoral to tibial bypass  03/16/2011    [facility]  . Amputation left great toe  02/2012    [facility]  . Cea      On the left    Family History: No family history on file.  Social History:   reports that he has been smoking.  He does not have any smokeless tobacco history on file. He reports that he does not drink alcohol or use illicit drugs.  Allergies:  Allergies  Allergen Reactions  . Flagyl [Metronidazole] Itching and Rash  . Penicillins Itching and Rash     Medications:  Current facility-administered medications:  .   stroke: mapping our early stages of recovery book, , Does not apply, Once, Nishant Dhungel, MD .  0.9 %  sodium chloride infusion, , Intravenous, Continuous, Taber Sweetser Daniel Nones, MD, Last Rate: 125 mL/hr at 05/23/15 1626 .  acetaminophen (TYLENOL) tablet 325 mg, 325 mg, Oral, BID, Nishant Dhungel, MD, 325 mg at 05/23/15 2141 .  amitriptyline (ELAVIL) tablet 25 mg, 25 mg, Oral, QHS, Nishant Dhungel, MD, 25 mg at 05/23/15 2142 .  aspirin tablet 325 mg, 325 mg, Oral, Daily, Ramiro Harvest V, MD, 325 mg at 05/23/15 1017 .  atorvastatin (LIPITOR) tablet 80 mg, 80 mg, Oral, q1800, Henderson Cloud, MD, 80 mg at 05/23/15 1816 .  cholecalciferol (VITAMIN D) tablet 800 Units, 800 Units, Oral, Daily, Nishant Dhungel, MD, 800 Units at 05/23/15 1248 .  clopidogrel (PLAVIX) tablet 75 mg, 75 mg, Oral, Daily, Ramiro Harvest V, MD, 75 mg at 05/23/15 1016 .  DOPamine (INTROPIN) 3.2-5 MG/ML-% infusion, , , ,  .  DOPamine (INTROPIN) 800 mg in dextrose 5 % 250 mL (3.2 mg/mL) infusion, 0-20 mcg/kg/min, Intravenous, Continuous, Lupita Leash, MD, Last Rate: 3.4 mL/hr at 05/23/15 2013, 2.5 mcg/kg/min at 05/23/15 2013 .  gabapentin (NEURONTIN) capsule 900 mg, 900 mg, Oral, BID,  Nishant Dhungel, MD, 900 mg at 05/23/15 2141 .  heparin injection 5,000 Units, 5,000 Units, Subcutaneous, 3 times per day, Geovana Gebel Daniel Nones, MD .  insulin aspart (novoLOG) injection 0-15 Units, 0-15 Units, Subcutaneous, 6 times per day, Rodolph Bong, MD, 5 Units at 05/23/15 1203 .  levETIRAcetam (KEPPRA) tablet 750 mg, 750 mg, Oral, BID, Rodolph Bong, MD, 750 mg at 05/23/15 2141 .  magnesium oxide (MAG-OX) tablet 800 mg, 800 mg, Oral, Daily, Nishant Dhungel, MD, 800 mg at 05/23/15 1249 .  pantoprazole (PROTONIX) EC tablet 40 mg, 40 mg, Oral, Daily, Nishant Dhungel, MD, 40 mg at 05/23/15 1248 .  senna-docusate (Senokot-S) tablet 1 tablet, 1 tablet, Oral, QHS PRN, Nishant Dhungel, MD   ROS:                                                                                                                                       History  unobtainable from patient due to lack of cooperation  Neurologic Examination:                                                                                                    Today's Vitals  05/23/15 1915 05/23/15 1930 05/23/15 2000 05/23/15 2100  BP: 116/60 112/81 137/74 112/56  Pulse: 60 64 69 64  Temp:   98.4 F (36.9 C)   TempSrc:   Axillary   Resp: 15 16 17 14   Height:      Weight:      SpO2: 95% 91% 98% 88%  PainSc:        Evaluation of higher integrative functions including: Level of alertness: Slightly drowsy but easily arousable to verbal commands and follows commands. Oriented to time, place and person Speech: Mild to moderate dysarthria , no aphasia noted.  Test the following cranial nerves: 2-12 grossly intact, left facial UMN weakness noted Motor examination: Full strength in the right upper extremity and in the right proximal leg stump , completely plegic in the left upper extremity and the left thigh stump , bilateral AKA  Examination of sensation : Reduced sensation on left Test coordination: No abnormal involuntary  movements or tremors noted.    Lab Results: Basic Metabolic Panel:  Recent Labs Lab 05/22/15 1943 05/23/15 0917  NA 134* 138  K 3.6 3.8  CL 99* 104  CO2 28 27  GLUCOSE 266* 249*  BUN 18 18  CREATININE 1.81* 1.79*  CALCIUM 8.2* 7.9*    Liver Function Tests:  Recent Labs Lab 05/23/15 0917  AST 18  ALT 13*  ALKPHOS 90  BILITOT 0.6  PROT 5.7*  ALBUMIN 2.1*   No results for input(s): LIPASE, AMYLASE in the last 168 hours. No results for input(s): AMMONIA in the last 168 hours.  CBC:  Recent Labs Lab 05/22/15 1943 05/23/15 0917  WBC 10.6* 8.3  NEUTROABS 7.6  --   HGB 15.1 13.4  HCT 45.9 41.8  MCV 81.8 83.1  PLT 330 278    Cardiac Enzymes: No results for input(s): CKTOTAL, CKMB, CKMBINDEX, TROPONINI in the last 168 hours.  Lipid Panel:  Recent Labs Lab 05/23/15 1004  CHOL 295*  TRIG 292*  HDL 31*  CHOLHDL 9.5  VLDL 58*  LDLCALC 206*    CBG:  Recent Labs Lab 05/22/15 2149 05/23/15 1155 05/23/15 1455 05/23/15 1641 05/23/15 1925  GLUCAP 229* 205* 104* 89 102*    Microbiology: Results for orders placed or performed during the hospital encounter of 05/22/15  MRSA PCR Screening     Status: None   Collection Time: 05/23/15  3:46 PM  Result Value Ref Range Status   MRSA by PCR NEGATIVE NEGATIVE Final    Comment:        The GeneXpert MRSA Assay (FDA approved for NASAL specimens only), is one component of a comprehensive MRSA colonization surveillance program. It is not intended to diagnose MRSA infection nor to guide or monitor treatment for MRSA infections.      Imaging: Dg Chest 2 View  05/22/2015  CLINICAL DATA:  De Hollingshead several times since Thursday, BILATERAL lower extremity amputee, diabetes mellitus, peripheral vascular disease, smoker EXAM: CHEST  2 VIEW COMPARISON:  09/18/2006 FINDINGS: Upper normal heart size. Mediastinal contours and pulmonary vascularity normal. Lungs clear. No pleural effusion or pneumothorax. Bones  unremarkable. IMPRESSION: No acute abnormalities. Electronically Signed   By: Ulyses Southward M.D.   On: 05/22/2015 17:37   Ct Head Wo Contrast  05/22/2015  CLINICAL DATA:  Multiple recent falls. The patient now has left arm weakness. EXAM: CT HEAD WITHOUT CONTRAST CT CERVICAL SPINE WITHOUT CONTRAST TECHNIQUE: Multidetector CT imaging of the head and cervical spine was performed following the standard protocol without intravenous contrast.  Multiplanar CT image reconstructions of the cervical spine were also generated. COMPARISON:  CT scan dated 11/19/2009 FINDINGS: CT HEAD FINDINGS There is a subacute nonhemorrhagic infarction high in the right parietal lobe with secondary edema and obliteration of adjacent cortical sulci. No midline shift. No hemorrhage. There is diffuse slight cerebral cortical atrophy. Old infarcts in the right frontoparietal region. No epidural or subdural or interventricular hemorrhage. No acute bone abnormality. Scalp contusions over the left occipital bone and high over the left posterior parietal region. CT CERVICAL SPINE FINDINGS There is no fracture or subluxation or prevertebral soft tissue swelling. Soft disc protrusion with accompanying osteophytes central and to the left at C3-4. Broad-based disc protrusion with accompanying osteophytes at C5-6. Calcification in the right carotid bifurcation. Surgical clips around the left carotid bifurcation. IMPRESSION: 1. Subacute nonhemorrhagic infarction high in the right parietal lobe with secondary edema of that portion of the brain without midline shift. 2. Multiple small scalp contusions. 3. No acute abnormality of the cervical spine. Electronically Signed   By: Francene Boyers M.D.   On: 05/22/2015 17:25   Ct Cervical Spine Wo Contrast  05/22/2015  CLINICAL DATA:  Multiple recent falls. The patient now has left arm weakness. EXAM: CT HEAD WITHOUT CONTRAST CT CERVICAL SPINE WITHOUT CONTRAST TECHNIQUE: Multidetector CT imaging of the head and  cervical spine was performed following the standard protocol without intravenous contrast. Multiplanar CT image reconstructions of the cervical spine were also generated. COMPARISON:  CT scan dated 11/19/2009 FINDINGS: CT HEAD FINDINGS There is a subacute nonhemorrhagic infarction high in the right parietal lobe with secondary edema and obliteration of adjacent cortical sulci. No midline shift. No hemorrhage. There is diffuse slight cerebral cortical atrophy. Old infarcts in the right frontoparietal region. No epidural or subdural or interventricular hemorrhage. No acute bone abnormality. Scalp contusions over the left occipital bone and high over the left posterior parietal region. CT CERVICAL SPINE FINDINGS There is no fracture or subluxation or prevertebral soft tissue swelling. Soft disc protrusion with accompanying osteophytes central and to the left at C3-4. Broad-based disc protrusion with accompanying osteophytes at C5-6. Calcification in the right carotid bifurcation. Surgical clips around the left carotid bifurcation. IMPRESSION: 1. Subacute nonhemorrhagic infarction high in the right parietal lobe with secondary edema of that portion of the brain without midline shift. 2. Multiple small scalp contusions. 3. No acute abnormality of the cervical spine. Electronically Signed   By: Francene Boyers M.D.   On: 05/22/2015 17:25   Mr Shirlee Latch Wo Contrast  05/23/2015  CLINICAL DATA:  60 year old male with altered mental status and left side weakness for 1 day. Agitation requiring exam discontinuation prior to completion. Initial encounter. EXAM: MRA HEAD WITHOUT CONTRAST TECHNIQUE: Angiographic images of the Circle of Willis were obtained using MRA technique without intravenous contrast. COMPARISON:  Motion degraded brain MRI from today reported separately. Noncontrast head CT 05/22/2015. FINDINGS: Study is moderately degraded by motion artifact despite repeated imaging attempts. Antegrade flow signal in the  basilar artery. The basilar tip in proximal PCAs are patent with preserved flow signal. Antegrade flow signal in both ICA siphons. Both carotid termini appear patent with flow signal. Patent MCA and ACA origins. The right MCA M1 segment and bifurcation appear remain patent. Likewise, proximal left MCA and ACA is appear patent. Motion artifact otherwise degrades intracranial vessel detail. IMPRESSION: Motion degraded. No emergent large vessel occlusion to correspond with the acute posterior right MCA infarct. Electronically Signed   By: Althea Grimmer.D.  On: 05/23/2015 11:23   Mr Brain Wo Contrast  05/23/2015  CLINICAL DATA:  60 year old male with altered mental status and left side weakness for 1 day. Agitation requiring exam discontinuation prior to completion. Initial encounter. EXAM: MRI HEAD WITHOUT CONTRAST TECHNIQUE: Multiplanar, multiecho pulse sequences of the brain and surrounding structures were obtained without intravenous contrast. COMPARISON:  Head and cervical spine CT without contrast 05/22/2015. Brain MRI 11/20/2009. FINDINGS: Study is moderately to severely degraded by motion artifact despite repeated imaging attempts. Also the examination had to be discontinued prior to completion due to patient agitation. Only motion degraded axial diffusion, coronal diffusion, axial T2 imaging, and sagittal T1 weighted imaging was obtained 5-6 cm area of confluent restricted diffusion in the posterior superior right hemisphere, corresponding to posterior right MCA vascular territory. Several small areas of cortical restricted diffusion superimposed in the lateral right occipital lobe. No contralateral or posterior fossa restricted diffusion is evident. Basilar cisterns remain patent. No ventriculomegaly. Mild cytotoxic edema evident on T1 weighted imaging at this time. IMPRESSION: 1. Acute posterior right MCA territory infarct corresponding to the abnormal CT appearance yesterday. Cytotoxic edema without  intracranial mass effect at this time. 2. Intracranial MRA from today reported separately. 3. Moderately to severely motion degraded and the examination had to be discontinued prior to completion due to patient agitation. 4. Consider surveillance for associated intracranial hemorrhage and mass effect with noncontrast CT. Electronically Signed   By: Odessa Fleming M.D.   On: 05/23/2015 11:21   US Renal  05/23/2015  CLINICAL DATA:  Acute renal failure. EXAM: RENAL / URINARY TRACT ULTRASOUND COMPLETE COMPARISON:  None. FINDINGS: Right Kidney: Length: 12.2 cm. Echogenic right kidney. No renal parenchymal atrophy. No renal mass. No hydronephrosis. Left Kidney: Length: 10.7 cm. Echogenic left kidney. No renal parenchymal atrophy. No renal mass. No hydronephrosis. Bladder: Both ureteral jets are demonstrated in the bladder lumen. Mild diffuse bladder wall thickening and trabeculation. IMPRESSION: 1. No hydronephrosis. 2. Echogenic normal size kidneys, indicating nonspecific renal parenchymal disease of uncertain chronicity. 3. Mild diffuse bladder wall thickening and trabeculation, suggesting chronic bladder voiding dysfunction such as due to chronic bladder outlet obstruction by an enlarged prostate gland. The prostate gland is not evaluated on this scan. Electronically Signed   By: Delbert Phenix M.D.   On: 05/23/2015 08:36   US Carotid Bilateral  05/23/2015  CLINICAL DATA:  60 year old male with a history of prior stroke. Cardiovascular risk factors include hypertension, stroke/TIA, hyperlipidemia, diabetes, tobacco use. EXAM: BILATERAL CAROTID DUPLEX ULTRASOUND TECHNIQUE: Wallace Cullens scale imaging, color Doppler and duplex ultrasound were performed of bilateral carotid and vertebral arteries in the neck. COMPARISON:  CT brain 05/22/2015, angiogram 11/22/2009 FINDINGS: Criteria: Quantification of carotid stenosis is based on velocity parameters that correlate the residual internal carotid diameter with NASCET-based stenosis  levels, using the diameter of the distal internal carotid lumen as the denominator for stenosis measurement. The following velocity measurements were obtained: RIGHT ICA:  Systolic 63 cm/sec, Diastolic 31 cm/sec CCA:  521 cm/sec SYSTOLIC ICA/CCA RATIO:  0.1 ECA:  59 cm/sec LEFT ICA:  Systolic 98 cm/sec, Diastolic 27 cm/sec CCA:  103 cm/sec SYSTOLIC ICA/CCA RATIO:  1.0 ECA:  57 cm/sec Right Brachial SBP: Not acquired Left Brachial SBP: Not acquired RIGHT CAROTID ARTERY: Significant atherosclerotic changes of the right common carotid artery with intimal thickening. Antegrade flow with high resistant waveform in the mid common carotid. Maximum velocity 521 centimeter/second. There is heterogeneously hypoechoic plaque/ thrombus centralized within the lumen of the common carotid artery.  Heterogeneous and partially calcified plaque at the carotid bifurcation. Significantly decreased flow of the external carotid artery. Parvus tardus waveform of the internal carotid artery. RIGHT VERTEBRAL ARTERY: Antegrade flow with low resistance waveform. LEFT CAROTID ARTERY: Patient is status post left-sided carotid endarterectomy. Heterogeneous atherosclerotic changes of the left common carotid artery. Intermediate waveform maintained. Heterogeneous plaque/surgical changes of the carotid bifurcation. No significant shadowing across the lumen. ICA remains patent with low resistance antegrade waveform. High diastolic component on the left within the ICA. No significant tortuosity. LEFT VERTEBRAL ARTERY:  Antegrade flow with low resistance waveform. IMPRESSION: Right: Nearly occlusive centralized thrombus/plaque within the right common carotid artery which contributes to post stenotic and decreased antegrade flow of the right ICA. This plaque is favored to represent acute embolism/thrombus given the morphology. Formal cerebral angiogram is recommended. As a second best test, CTA. Left: The patient is status post left carotid  endarterectomy in September 2011, with atherosclerotic changes of the common carotid artery. Established duplex criteria have not been validated for assessment after surgery, however, there is no evidence of compromised flow. High diastolic component of the flow on the left suggests increase contribution, potentially to the right hemisphere given the small infarction on recent CT. These results were called by telephone at the time of interpretation on 05/23/2015 at 9:09 am to Dr. Verdie MosherLiu, who verbally acknowledged these results. Signed, Yvone NeuJaime S. Loreta AveWagner, DO [facility] Electronically Signed   By: Gilmer MorJaime  Wagner D.O.   On: 05/23/2015 09:10   Dg Shoulder Left  05/22/2015  CLINICAL DATA:  De HollingsheadFallen several times since last Thursday and unable to raise left arm. EXAM: LEFT SHOULDER - 2+ VIEW COMPARISON:  None. FINDINGS: There is no evidence of fracture or dislocation. There is no evidence of arthropathy or other focal bone abnormality. Soft tissues are unremarkable. IMPRESSION: Negative. Electronically Signed   By: Kennith CenterEric  Mansell M.D.   On: 05/22/2015 17:37      Assessment and plan:   Nuala AlphaBruce L Garcia is an 60 y.o. male patient who initially presented [facility] with new onset left-sided weakness more than 48 hours ago on 05/21/2015. Patient is a poor historian and most of information is obtained through review of EMR. His been transferred [facility] for further evaluation and management of a near occlusive thrombus noted in the right ICA and ultrasound of the carotid done as part of the stroke workup in the hospital. His left-sided weakness in upper extremity is also reported to be worsening today which is another the reason for transfer.   while in ICU his blood pressure was noted to be in the 80s to 90s systolic. He was on IV fluids.  He has congestive heart failure with EF of 35% with global hypokinesis.  Critical  care team is consulted to help with his blood pressure management and use of pressors. He was started on dopamine.  Cardiology has also been consulted for further evaluation of his congestive heart failure.   A CT angiogram of the head and neck and perfusion studies were ordered for further neurodiagnostic evaluation Patient has been unstable to be transferred out of ICU to complete these studies at the time of finalizing this note.  He has been on aspirin and Plavix.  We'll continue the same.  Stroke team will continue to follow starting tomorrow morning

## 2015-05-23 NOTE — Consult Note (Signed)
Reason for Consult: Cardiomyopathy Referring Physician:   [name] is an 60 y.o. male.  HPI:   Patient is a 60 year old male with history of diabetes mellitus, diabetic neuropathy, seizures, hypertension, acid reflux, peripheral vascular disease, CVA, carotid stenosis-carotid endarterectomy on the left, hyperlipidemia.   No prior cardiac history.   He's had left femoral-tibial bypass 2013 at the [facility] and bilateral AKA.   He presented with acute left upper extremity weakness and numbness. MRI of the brain showed Acute posterior right MCA territory infarct corresponding to the abnormal CT appearance yesterday. Cytotoxic edema without intracranial mass effect at this time.CT of the head showed subacute nonhemorrhagic infarction high in the right. A lobe with secondary edema of that portion of the brain without midline shift. He had a MRA of the head which revealed no emergent large vessel occlusion to correspond with acute posterior right MCA infarct.  Carotid Dopplers revealed nearly occlusive centralized thrombus within the right common carotid artery thought to be acute embolism.  Left common carotid appears to no evidence of compromise to flow.     2-D echocardiogram revealed ejection fraction of 35% with diffuse hypokinesis and mild LVH. Was akinesis of the basal inferior lateral and inferior myocardium. Grade 1 diastolic dysfunction. Trivial MR. Pulmonary arterial pressure could not be adequately assessed.    The patient reports developing weakness in his left upper extremity and ultimately was not able to move it. The patient currently denies nausea, vomiting, fever, chest pain, shortness of breath, orthopnea, dizziness, PND, cough, congestion, abdominal pain, hematochezia, melena, lower extremity edema.  He says he is not compliant with his medications.   Lipid Panel     Component Value Date/Time   CHOL 295* 05/23/2015 1004   TRIG 292* 05/23/2015 1004   HDL 31*  05/23/2015 1004   CHOLHDL 9.5 05/23/2015 1004   VLDL 58* 05/23/2015 1004   LDLCALC 206* 05/23/2015 1004      Past Medical History  Diagnosis Date  . Diabetes mellitus ([place])   . Diabetic neuropathy ([place])   . Seizures ([place])   . Hypertension   . Acid reflux   . Hypercholesteremia   . Peripheral vascular disease due to secondary diabetes mellitus ([place])   . CVA (cerebral infarction)   . Carotid stenosis     s/p CEA on the left  . Glaucoma     Past Surgical History  Procedure Laterality Date  . Eye surgery    . Left femoral to tibial bypass  03/16/2011    [facility]  . Amputation left great toe  02/2012    [facility]  . Cea      On the left    No family history on file.  Social History:  reports that he has been smoking.  He does not have any smokeless tobacco history on file. He reports that he does not drink alcohol or use illicit drugs.  Allergies:  Allergies  Allergen Reactions  . Flagyl [Metronidazole] Itching and Rash  . Penicillins Itching and Rash    Medications: Scheduled Meds: .  stroke: mapping our early stages of recovery book   Does not apply Once  . acetaminophen  325 mg Oral BID  . amitriptyline  25 mg Oral QHS  . aspirin  325 mg Oral Daily  . atorvastatin  80 mg Oral q1800  . cholecalciferol  800 Units Oral Daily  . clopidogrel  75 mg Oral Daily  . DOPamine      .  gabapentin  900 mg Oral BID  . heparin  5,000 Units Subcutaneous 3 times per day  . insulin aspart  0-15 Units Subcutaneous 6 times per day  . levETIRAcetam  750 mg Oral BID  . magnesium oxide  800 mg Oral Daily  . pantoprazole  40 mg Oral Daily   Continuous Infusions: . sodium chloride 125 mL/hr at 05/23/15 1626  . DOPamine 2.5 mcg/kg/min (05/23/15 2013)   PRN Meds:.senna-docusate   Results for orders placed or performed during the hospital encounter of 05/22/15 (from the past 48 hour(s))  CBC with Differential     Status: Abnormal   Collection Time: 05/22/15   7:43 PM  Result Value Ref Range   WBC 10.6 (H) 4.0 - 10.5 K/uL   RBC 5.61 4.22 - 5.81 MIL/uL   Hemoglobin 15.1 13.0 - 17.0 g/dL   HCT 45.9 39.0 - 52.0 %   MCV 81.8 78.0 - 100.0 fL   MCH 26.9 26.0 - 34.0 pg   MCHC 32.9 30.0 - 36.0 g/dL   RDW 14.5 11.5 - 15.5 %   Platelets 330 150 - 400 K/uL   Neutrophils Relative % 71 %   Neutro Abs 7.6 1.7 - 7.7 K/uL   Lymphocytes Relative 20 %   Lymphs Abs 2.1 0.7 - 4.0 K/uL   Monocytes Relative 6 %   Monocytes Absolute 0.6 0.1 - 1.0 K/uL   Eosinophils Relative 2 %   Eosinophils Absolute 0.2 0.0 - 0.7 K/uL   Basophils Relative 1 %   Basophils Absolute 0.1 0.0 - 0.1 K/uL  Basic metabolic panel     Status: Abnormal   Collection Time: 05/22/15  7:43 PM  Result Value Ref Range   Sodium 134 (L) 135 - 145 mmol/L   Potassium 3.6 3.5 - 5.1 mmol/L   Chloride 99 (L) 101 - 111 mmol/L   CO2 28 22 - 32 mmol/L   Glucose, Bld 266 (H) 65 - 99 mg/dL   BUN 18 6 - 20 mg/dL   Creatinine, Ser 1.81 (H) 0.61 - 1.24 mg/dL   Calcium 8.2 (L) 8.9 - 10.3 mg/dL   GFR calc non Af Amer 40 (L) >60 mL/min   GFR calc Af Amer 46 (L) >60 mL/min    Comment: (NOTE) The eGFR has been calculated using the CKD EPI equation. This calculation has not been validated in all clinical situations. eGFR's persistently <60 mL/min signify possible Chronic Kidney Disease.    Anion gap 7 5 - 15  Urinalysis, Routine w reflex microscopic (not at [facility])     Status: Abnormal   Collection Time: 05/22/15  9:45 PM  Result Value Ref Range   Color, Urine YELLOW YELLOW   APPearance CLEAR CLEAR   Specific Gravity, Urine 1.025 1.005 - 1.030   pH 6.0 5.0 - 8.0   Glucose, UA >1000 (A) NEGATIVE mg/dL   Hgb urine dipstick LARGE (A) NEGATIVE   Bilirubin Urine NEGATIVE NEGATIVE   Ketones, ur TRACE (A) NEGATIVE mg/dL   Protein, ur >300 (A) NEGATIVE mg/dL   Nitrite NEGATIVE NEGATIVE   Leukocytes, UA NEGATIVE NEGATIVE  Sodium, urine, random     Status: None   Collection Time: 05/22/15  9:45 PM    Result Value Ref Range   Sodium, Ur 65 mmol/L  Creatinine, urine, random     Status: None   Collection Time: 05/22/15  9:45 PM  Result Value Ref Range   Creatinine, Urine 164.23 mg/dL  Urine microscopic-add on     Status:  Abnormal   Collection Time: 05/22/15  9:45 PM  Result Value Ref Range   Squamous Epithelial / LPF 6-30 (A) NONE SEEN   WBC, UA 0-5 0 - 5 WBC/hpf   RBC / HPF 0-5 0 - 5 RBC/hpf   Bacteria, UA FEW (A) NONE SEEN   Casts GRANULAR CAST (A) NEGATIVE  CBG monitoring, ED     Status: Abnormal   Collection Time: 05/22/15  9:49 PM  Result Value Ref Range   Glucose-Capillary 229 (H) 65 - 99 mg/dL  Comprehensive metabolic panel     Status: Abnormal   Collection Time: 05/23/15  9:17 AM  Result Value Ref Range   Sodium 138 135 - 145 mmol/L   Potassium 3.8 3.5 - 5.1 mmol/L   Chloride 104 101 - 111 mmol/L   CO2 27 22 - 32 mmol/L   Glucose, Bld 249 (H) 65 - 99 mg/dL   BUN 18 6 - 20 mg/dL   Creatinine, Ser 1.79 (H) 0.61 - 1.24 mg/dL   Calcium 7.9 (L) 8.9 - 10.3 mg/dL   Total Protein 5.7 (L) 6.5 - 8.1 g/dL   Albumin 2.1 (L) 3.5 - 5.0 g/dL   AST 18 15 - 41 U/L   ALT 13 (L) 17 - 63 U/L   Alkaline Phosphatase 90 38 - 126 U/L   Total Bilirubin 0.6 0.3 - 1.2 mg/dL   GFR calc non Af Amer 40 (L) >60 mL/min   GFR calc Af Amer 46 (L) >60 mL/min    Comment: (NOTE) The eGFR has been calculated using the CKD EPI equation. This calculation has not been validated in all clinical situations. eGFR's persistently <60 mL/min signify possible Chronic Kidney Disease.    Anion gap 7 5 - 15  CBC     Status: None   Collection Time: 05/23/15  9:17 AM  Result Value Ref Range   WBC 8.3 4.0 - 10.5 K/uL   RBC 5.03 4.22 - 5.81 MIL/uL   Hemoglobin 13.4 13.0 - 17.0 g/dL   HCT 41.8 39.0 - 52.0 %   MCV 83.1 78.0 - 100.0 fL   MCH 26.6 26.0 - 34.0 pg   MCHC 32.1 30.0 - 36.0 g/dL   RDW 14.4 11.5 - 15.5 %   Platelets 278 150 - 400 K/uL  Lipid panel     Status: Abnormal   Collection Time: 05/23/15  10:04 AM  Result Value Ref Range   Cholesterol 295 (H) 0 - 200 mg/dL   Triglycerides 292 (H) <150 mg/dL   HDL 31 (L) >40 mg/dL   Total CHOL/HDL Ratio 9.5 RATIO   VLDL 58 (H) 0 - 40 mg/dL   LDL Cholesterol 206 (H) 0 - 99 mg/dL    Comment:        Total Cholesterol/HDL:CHD Risk Coronary Heart Disease Risk Table                     Men   Women  1/2 Average Risk   3.4   3.3  Average Risk       5.0   4.4  2 X Average Risk   9.6   7.1  3 X Average Risk  23.4   11.0        Use the calculated Patient Ratio above and the CHD Risk Table to determine the patient's CHD Risk.        ATP III CLASSIFICATION (LDL):  <100     mg/dL   Optimal  100-129  mg/dL  Near or Above                    Optimal  130-159  mg/dL   Borderline  160-189  mg/dL   High  >190     mg/dL   Very High   CBG monitoring, ED     Status: Abnormal   Collection Time: 05/23/15 11:55 AM  Result Value Ref Range   Glucose-Capillary 205 (H) 65 - 99 mg/dL  CBG monitoring, ED     Status: Abnormal   Collection Time: 05/23/15  2:55 PM  Result Value Ref Range   Glucose-Capillary 104 (H) 65 - 99 mg/dL  MRSA PCR Screening     Status: None   Collection Time: 05/23/15  3:46 PM  Result Value Ref Range   MRSA by PCR NEGATIVE NEGATIVE    Comment:        The GeneXpert MRSA Assay (FDA approved for NASAL specimens only), is one component of a comprehensive MRSA colonization surveillance program. It is not intended to diagnose MRSA infection nor to guide or monitor treatment for MRSA infections.   Glucose, capillary     Status: None   Collection Time: 05/23/15  4:41 PM  Result Value Ref Range   Glucose-Capillary 89 65 - 99 mg/dL    Dg Chest 2 View  05/22/2015  CLINICAL DATA:  Jerelyn Scott several times since Thursday, BILATERAL lower extremity amputee, diabetes mellitus, peripheral vascular disease, smoker EXAM: CHEST  2 VIEW COMPARISON:  09/18/2006 FINDINGS: Upper normal heart size. Mediastinal contours and pulmonary vascularity  normal. Lungs clear. No pleural effusion or pneumothorax. Bones unremarkable. IMPRESSION: No acute abnormalities. Electronically Signed   By: Lavonia Dana M.D.   On: 05/22/2015 17:37   Ct Head Wo Contrast  05/22/2015  CLINICAL DATA:  Multiple recent falls. The patient now has left arm weakness. EXAM: CT HEAD WITHOUT CONTRAST CT CERVICAL SPINE WITHOUT CONTRAST TECHNIQUE: Multidetector CT imaging of the head and cervical spine was performed following the standard protocol without intravenous contrast. Multiplanar CT image reconstructions of the cervical spine were also generated. COMPARISON:  CT scan dated 11/19/2009 FINDINGS: CT HEAD FINDINGS There is a subacute nonhemorrhagic infarction high in the right parietal lobe with secondary edema and obliteration of adjacent cortical sulci. No midline shift. No hemorrhage. There is diffuse slight cerebral cortical atrophy. Old infarcts in the right frontoparietal region. No epidural or subdural or interventricular hemorrhage. No acute bone abnormality. Scalp contusions over the left occipital bone and high over the left posterior parietal region. CT CERVICAL SPINE FINDINGS There is no fracture or subluxation or prevertebral soft tissue swelling. Soft disc protrusion with accompanying osteophytes central and to the left at C3-4. Broad-based disc protrusion with accompanying osteophytes at C5-6. Calcification in the right carotid bifurcation. Surgical clips around the left carotid bifurcation. IMPRESSION: 1. Subacute nonhemorrhagic infarction high in the right parietal lobe with secondary edema of that portion of the brain without midline shift. 2. Multiple small scalp contusions. 3. No acute abnormality of the cervical spine. Electronically Signed   By: Lorriane Shire M.D.   On: 05/22/2015 17:25   Ct Cervical Spine Wo Contrast  05/22/2015  CLINICAL DATA:  Multiple recent falls. The patient now has left arm weakness. EXAM: CT HEAD WITHOUT CONTRAST CT CERVICAL SPINE  WITHOUT CONTRAST TECHNIQUE: Multidetector CT imaging of the head and cervical spine was performed following the standard protocol without intravenous contrast. Multiplanar CT image reconstructions of the cervical spine were also generated. COMPARISON:  CT  scan dated 11/19/2009 FINDINGS: CT HEAD FINDINGS There is a subacute nonhemorrhagic infarction high in the right parietal lobe with secondary edema and obliteration of adjacent cortical sulci. No midline shift. No hemorrhage. There is diffuse slight cerebral cortical atrophy. Old infarcts in the right frontoparietal region. No epidural or subdural or interventricular hemorrhage. No acute bone abnormality. Scalp contusions over the left occipital bone and high over the left posterior parietal region. CT CERVICAL SPINE FINDINGS There is no fracture or subluxation or prevertebral soft tissue swelling. Soft disc protrusion with accompanying osteophytes central and to the left at C3-4. Broad-based disc protrusion with accompanying osteophytes at C5-6. Calcification in the right carotid bifurcation. Surgical clips around the left carotid bifurcation. IMPRESSION: 1. Subacute nonhemorrhagic infarction high in the right parietal lobe with secondary edema of that portion of the brain without midline shift. 2. Multiple small scalp contusions. 3. No acute abnormality of the cervical spine. Electronically Signed   By: Lorriane Shire M.D.   On: 05/22/2015 17:25   Mr Virgel Paling Wo Contrast  05/23/2015  CLINICAL DATA:  60 year old male with altered mental status and left side weakness for 1 day. Agitation requiring exam discontinuation prior to completion. Initial encounter. EXAM: MRA HEAD WITHOUT CONTRAST TECHNIQUE: Angiographic images of the Circle of Willis were obtained using MRA technique without intravenous contrast. COMPARISON:  Motion degraded brain MRI from today reported separately. Noncontrast head CT 05/22/2015. FINDINGS: Study is moderately degraded by motion  artifact despite repeated imaging attempts. Antegrade flow signal in the basilar artery. The basilar tip in proximal PCAs are patent with preserved flow signal. Antegrade flow signal in both ICA siphons. Both carotid termini appear patent with flow signal. Patent MCA and ACA origins. The right MCA M1 segment and bifurcation appear remain patent. Likewise, proximal left MCA and ACA is appear patent. Motion artifact otherwise degrades intracranial vessel detail. IMPRESSION: Motion degraded. No emergent large vessel occlusion to correspond with the acute posterior right MCA infarct. Electronically Signed   By: Genevie Ann M.D.   On: 05/23/2015 11:23   Mr Brain Wo Contrast  05/23/2015  CLINICAL DATA:  60 year old male with altered mental status and left side weakness for 1 day. Agitation requiring exam discontinuation prior to completion. Initial encounter. EXAM: MRI HEAD WITHOUT CONTRAST TECHNIQUE: Multiplanar, multiecho pulse sequences of the brain and surrounding structures were obtained without intravenous contrast. COMPARISON:  Head and cervical spine CT without contrast 05/22/2015. Brain MRI 11/20/2009. FINDINGS: Study is moderately to severely degraded by motion artifact despite repeated imaging attempts. Also the examination had to be discontinued prior to completion due to patient agitation. Only motion degraded axial diffusion, coronal diffusion, axial T2 imaging, and sagittal T1 weighted imaging was obtained 5-6 cm area of confluent restricted diffusion in the posterior superior right hemisphere, corresponding to posterior right MCA vascular territory. Several small areas of cortical restricted diffusion superimposed in the lateral right occipital lobe. No contralateral or posterior fossa restricted diffusion is evident. Basilar cisterns remain patent. No ventriculomegaly. Mild cytotoxic edema evident on T1 weighted imaging at this time. IMPRESSION: 1. Acute posterior right MCA territory infarct corresponding  to the abnormal CT appearance yesterday. Cytotoxic edema without intracranial mass effect at this time. 2. Intracranial MRA from today reported separately. 3. Moderately to severely motion degraded and the examination had to be discontinued prior to completion due to patient agitation. 4. Consider surveillance for associated intracranial hemorrhage and mass effect with noncontrast CT. Electronically Signed   By: Herminio Heads.D.  On: 05/23/2015 11:21   US Renal  05/23/2015  CLINICAL DATA:  Acute renal failure. EXAM: RENAL / URINARY TRACT ULTRASOUND COMPLETE COMPARISON:  None. FINDINGS: Right Kidney: Length: 12.2 cm. Echogenic right kidney. No renal parenchymal atrophy. No renal mass. No hydronephrosis. Left Kidney: Length: 10.7 cm. Echogenic left kidney. No renal parenchymal atrophy. No renal mass. No hydronephrosis. Bladder: Both ureteral jets are demonstrated in the bladder lumen. Mild diffuse bladder wall thickening and trabeculation. IMPRESSION: 1. No hydronephrosis. 2. Echogenic normal size kidneys, indicating nonspecific renal parenchymal disease of uncertain chronicity. 3. Mild diffuse bladder wall thickening and trabeculation, suggesting chronic bladder voiding dysfunction such as due to chronic bladder outlet obstruction by an enlarged prostate gland. The prostate gland is not evaluated on this scan. Electronically Signed   By: Ilona Sorrel M.D.   On: 05/23/2015 08:36   US Carotid Bilateral  05/23/2015  CLINICAL DATA:  60 year old male with a history of prior stroke. Cardiovascular risk factors include hypertension, stroke/TIA, hyperlipidemia, diabetes, tobacco use. EXAM: BILATERAL CAROTID DUPLEX ULTRASOUND TECHNIQUE: Pearline Cables scale imaging, color Doppler and duplex ultrasound were performed of bilateral carotid and vertebral arteries in the neck. COMPARISON:  CT brain 05/22/2015, angiogram 11/22/2009 FINDINGS: Criteria: Quantification of carotid stenosis is based on velocity parameters that correlate the  residual internal carotid diameter with NASCET-based stenosis levels, using the diameter of the distal internal carotid lumen as the denominator for stenosis measurement. The following velocity measurements were obtained: RIGHT ICA:  Systolic 63 cm/sec, Diastolic 31 cm/sec CCA:  169 cm/sec SYSTOLIC ICA/CCA RATIO:  0.1 ECA:  59 cm/sec LEFT ICA:  Systolic 98 cm/sec, Diastolic 27 cm/sec CCA:  678 cm/sec SYSTOLIC ICA/CCA RATIO:  1.0 ECA:  57 cm/sec Right Brachial SBP: Not acquired Left Brachial SBP: Not acquired RIGHT CAROTID ARTERY: Significant atherosclerotic changes of the right common carotid artery with intimal thickening. Antegrade flow with high resistant waveform in the mid common carotid. Maximum velocity 521 centimeter/second. There is heterogeneously hypoechoic plaque/ thrombus centralized within the lumen of the common carotid artery. Heterogeneous and partially calcified plaque at the carotid bifurcation. Significantly decreased flow of the external carotid artery. Parvus tardus waveform of the internal carotid artery. RIGHT VERTEBRAL ARTERY: Antegrade flow with low resistance waveform. LEFT CAROTID ARTERY: Patient is status post left-sided carotid endarterectomy. Heterogeneous atherosclerotic changes of the left common carotid artery. Intermediate waveform maintained. Heterogeneous plaque/surgical changes of the carotid bifurcation. No significant shadowing across the lumen. ICA remains patent with low resistance antegrade waveform. High diastolic component on the left within the ICA. No significant tortuosity. LEFT VERTEBRAL ARTERY:  Antegrade flow with low resistance waveform. IMPRESSION: Right: Nearly occlusive centralized thrombus/plaque within the right common carotid artery which contributes to post stenotic and decreased antegrade flow of the right ICA. This plaque is favored to represent acute embolism/thrombus given the morphology. Formal cerebral angiogram is recommended. As a second best test,  CTA. Left: The patient is status post left carotid endarterectomy in September 2011, with atherosclerotic changes of the common carotid artery. Established duplex criteria have not been validated for assessment after surgery, however, there is no evidence of compromised flow. High diastolic component of the flow on the left suggests increase contribution, potentially to the right hemisphere given the small infarction on recent CT. These results were called by telephone at the time of interpretation on 05/23/2015 at 9:09 am to Dr. Oleta Mouse, who verbally acknowledged these results. Signed, Dulcy Fanny. Earleen Newport, DO [facility] Electronically Signed   By: York Cerise  Earleen Newport D.O.   On: 05/23/2015 09:10   Dg Shoulder Left  05/22/2015  CLINICAL DATA:  Jerelyn Scott several times since last Thursday and unable to raise left arm. EXAM: LEFT SHOULDER - 2+ VIEW COMPARISON:  None. FINDINGS: There is no evidence of fracture or dislocation. There is no evidence of arthropathy or other focal bone abnormality. Soft tissues are unremarkable. IMPRESSION: Negative. Electronically Signed   By: Misty Stanley M.D.   On: 05/22/2015 17:37    Review of Systems  Constitutional: Negative for fever.  HENT: Negative for congestion.   Respiratory: Negative for cough and shortness of breath.   Cardiovascular: Negative for chest pain, palpitations, orthopnea and leg swelling.  Gastrointestinal: Negative for nausea, vomiting, abdominal pain, blood in stool and melena.  Musculoskeletal: Positive for myalgias (Left shoulder).  Neurological: Positive for focal weakness (Left sided complete loss of function) and weakness (Left sided). Negative for dizziness.  All other systems reviewed and are negative.  Blood pressure 116/60, pulse 60, temperature 98.1 F (36.7 C), temperature source Oral, resp. rate 15, height 4' 8"  (1.422 m), weight 164 lb 0.4 oz (74.4 kg), SpO2 95 %. Physical Exam  Nursing note  and vitals reviewed. Constitutional: He is oriented to person, place, and time. He appears well-developed and well-nourished. No distress.  HENT:  Head: Normocephalic and atraumatic.  Mouth/Throat: No oropharyngeal exudate.  Eyes: EOM are normal. Pupils are equal, round, and reactive to light.  Neck: Normal range of motion. Neck supple.  Cardiovascular: Normal rate, S1 normal and S2 normal.  An irregularly irregular rhythm present.  No murmur heard. Pulses:      Radial pulses are 2+ on the right side, and 2+ on the left side.  Respiratory: Effort normal and breath sounds normal. He has no wheezes. He has no rales.  GI: Soft. Bowel sounds are normal. He exhibits no distension. There is no tenderness.  Musculoskeletal: He exhibits no edema.  Neurological: He is alert and oriented to person, place, and time. He exhibits abnormal muscle tone.  Left hemiparesis   Skin: Skin is warm and dry.  Psychiatric: He has a normal mood and affect.    Assessment/Plan: Principal Problem:   Acute ischemic stroke (HCC) Active Problems:   Type II diabetes mellitus with complication, uncontrolled ([place])   Peripheral vascular disease due to secondary diabetes mellitus (HCC)   Seizure disorder ([place])   CVA (cerebral infarction)   Acute kidney injury ([place])   Below knee amputation status ([place])   Tobacco abuse   Cerebral infarction due to unspecified mechanism  60 year old male with history of diabetes mellitus, diabetic neuropathy, seizures, hypertension, acid reflux, peripheral vascular disease, CVA, carotid stenosis-carotid endarterectomy on the left, hyperlipidemia.   No prior cardiac history.  Presents with Acute posterior right MCA territory infarct.  Work up revealed thrombus in the right common carotid.  Echo revealed EF of 35% with akinesis of the basal inferior lateral and inferior myocardium.  Currently on Dopamine with good BP.  Plan to try and wean.  It is unknown if the cardiomyopathy is new.   troponin is cycling.  With his current Kidney function and acute CVA, a heart cath is not ideal at the moment.     Tarri Fuller, [place] 05/23/2015, 7:37 PM

## 2015-05-23 NOTE — ED Notes (Addendum)
Assessment Completed:  General : laying in bed, NAD, interacting appropriately CV: RRR, NSR 70s, no ectopy Pulm: CTA, nonlabored RR, satting well on room air 99% GI: Soft, nontender GU: Voids with urinal Extremity : bilateral lower ext amputee, +palp radial pulses bilaterally, brisk cap refill  Neuro: Awake, alert and oriented, facial symmetry, clear speech - Strength : LUE flaccid with occassional spastic movements, RUE 5/5, BLE with movement - Sensory : Decrease sensation LUE, sensation intact to RUE and BLE.    0105 PM : Shawn from bed placement updated with pt status, continues to say we are unable to transfer pt for IR due to no bed availability.   0110 PM : Dr. Janee Mornhompson updated. Charge nurse aware of status.   0130 PM : [facility] updated of pt status change. Per [facility], they were unaware of transfer request. Per Loistine ChancePhilip, he will evaluate [facility] status and call us back. EDP Dr. Deretha EmoryZackowski also updated.   0205 PM : [facility]. [facility] dispatching [facility] to [facility] [facility].   2:59 PM : Pt now with no movement of LLE (stump) and decrease sensation. IR and [facility] ICU updated. [facility] at bedside.

## 2015-05-24 ENCOUNTER — Inpatient Hospital Stay (HOSPITAL_COMMUNITY): Payer: Non-veteran care

## 2015-05-24 DIAGNOSIS — G40909 Epilepsy, unspecified, not intractable, without status epilepticus: Secondary | ICD-10-CM

## 2015-05-24 DIAGNOSIS — I959 Hypotension, unspecified: Secondary | ICD-10-CM

## 2015-05-24 DIAGNOSIS — E1165 Type 2 diabetes mellitus with hyperglycemia: Secondary | ICD-10-CM

## 2015-05-24 DIAGNOSIS — I5022 Chronic systolic (congestive) heart failure: Secondary | ICD-10-CM

## 2015-05-24 DIAGNOSIS — I429 Cardiomyopathy, unspecified: Secondary | ICD-10-CM

## 2015-05-24 DIAGNOSIS — E118 Type 2 diabetes mellitus with unspecified complications: Secondary | ICD-10-CM

## 2015-05-24 DIAGNOSIS — N179 Acute kidney failure, unspecified: Secondary | ICD-10-CM

## 2015-05-24 DIAGNOSIS — R9401 Abnormal electroencephalogram [EEG]: Secondary | ICD-10-CM

## 2015-05-24 DIAGNOSIS — I639 Cerebral infarction, unspecified: Secondary | ICD-10-CM

## 2015-05-24 LAB — BASIC METABOLIC PANEL
ANION GAP: 16 — AB (ref 5–15)
BUN: 17 mg/dL (ref 6–20)
CALCIUM: 7.7 mg/dL — AB (ref 8.9–10.3)
CO2: 17 mmol/L — AB (ref 22–32)
CREATININE: 1.86 mg/dL — AB (ref 0.61–1.24)
Chloride: 109 mmol/L (ref 101–111)
GFR calc Af Amer: 44 mL/min — ABNORMAL LOW (ref >60)
GFR, EST NON AFRICAN AMERICAN: 38 mL/min — AB (ref >60)
GLUCOSE: 154 mg/dL — AB (ref 65–99)
Potassium: 5.2 mmol/L — ABNORMAL HIGH (ref 3.5–5.1)
Sodium: 142 mmol/L (ref 135–145)

## 2015-05-24 LAB — LIPID PANEL
CHOLESTEROL: 264 mg/dL — AB (ref 0–200)
HDL: 30 mg/dL — ABNORMAL LOW (ref >40)
LDL Cholesterol: 180 mg/dL — ABNORMAL HIGH (ref 0–99)
Total CHOL/HDL Ratio: 8.8 RATIO
Triglycerides: 271 mg/dL — ABNORMAL HIGH (ref <150)
VLDL: 54 mg/dL — AB (ref 0–40)

## 2015-05-24 LAB — CBC
HCT: 41.6 % (ref 39.0–52.0)
Hemoglobin: 13.5 g/dL (ref 13.0–17.0)
MCH: 27.6 pg (ref 26.0–34.0)
MCHC: 32.5 g/dL (ref 30.0–36.0)
MCV: 85.1 fL (ref 78.0–100.0)
PLATELETS: 242 10*3/uL (ref 150–400)
RBC: 4.89 MIL/uL (ref 4.22–5.81)
RDW: 14.7 % (ref 11.5–15.5)
WBC: 8.3 10*3/uL (ref 4.0–10.5)

## 2015-05-24 LAB — TROPONIN I
Troponin I: 0.05 ng/mL — ABNORMAL HIGH (ref <0.031)
Troponin I: 0.05 ng/mL — ABNORMAL HIGH (ref <0.031)

## 2015-05-24 LAB — GLUCOSE, CAPILLARY
GLUCOSE-CAPILLARY: 173 mg/dL — AB (ref 65–99)
GLUCOSE-CAPILLARY: 136 mg/dL — AB (ref 65–99)
GLUCOSE-CAPILLARY: 118 mg/dL — AB (ref 65–99)
GLUCOSE-CAPILLARY: 81 mg/dL (ref 65–99)
Glucose-Capillary: 69 mg/dL (ref 65–99)
Glucose-Capillary: 118 mg/dL — ABNORMAL HIGH (ref 65–99)

## 2015-05-24 LAB — LACTIC ACID, PLASMA: Lactic Acid, Venous: 0.9 mmol/L (ref 0.5–2.0)

## 2015-05-24 LAB — PHOSPHORUS: Phosphorus: 2.6 mg/dL (ref 2.5–4.6)

## 2015-05-24 LAB — HEMOGLOBIN A1C
Hgb A1c MFr Bld: 12.6 % — ABNORMAL HIGH (ref 4.8–5.6)
Mean Plasma Glucose: 315 mg/dL

## 2015-05-24 LAB — MAGNESIUM: MAGNESIUM: 1.8 mg/dL (ref 1.7–2.4)

## 2015-05-24 MED ORDER — LEVETIRACETAM 500 MG PO TABS
1000.0000 mg | ORAL_TABLET | Freq: Two times a day (BID) | ORAL | Status: DC
  Administered 2015-05-24 – 2015-06-02 (×19): 1000 mg via ORAL
  Filled 2015-05-24 (×20): qty 2

## 2015-05-24 MED ORDER — SODIUM CHLORIDE 0.9 % IV SOLN
1000.0000 mg | Freq: Once | INTRAVENOUS | Status: AC
  Administered 2015-05-24: 1000 mg via INTRAVENOUS
  Filled 2015-05-24: qty 10

## 2015-05-24 MED ORDER — LORAZEPAM 2 MG/ML IJ SOLN
1.0000 mg | Freq: Once | INTRAMUSCULAR | Status: AC
  Administered 2015-05-24: 1 mg via INTRAVENOUS
  Filled 2015-05-24: qty 1

## 2015-05-24 NOTE — Procedures (Signed)
HPI:  60 y/o with hx of seizure and R PCA infarct, now with jerking of the LUE.  R/O seizure.  TECHNICAL SUMMARY:  A multichannel referential and bipolar montage EEG using the standard international 10-20 system was performed on the patient described as awake and drowsy.  The dominant background activity consists of 8-9 hertz activity seen most prominantly over the posterior head region.  This can be seen bilaterally, but slower 5-6 hertz activity can be seen overriding on the right posteriorally.  The backgound activity is nonreactive  to eye opening and closing procedures.  Low voltage fast (beta) activity is distributed symmetrically and maximally over the anterior head regions.  ACTIVATION:  Stepwise photic stimulation and HV are not done  EPILEPTIFORM ACTIVITY:  There were no spikes, sharp waves or paroxysmal activity.  SLEEP:  Physiologic drowsiness is noted, but no stage II sleep.  CARDIAC:  The EKG lead revealed a regular sinus rhythm.  IMPRESSION:  This is an abnormal EEG demonstrating posterior slowing on the right compared to that of the left, consistent with the patient's history of a new right PCA infarct.  There was no epileptiform activity recorded on this recording.

## 2015-05-24 NOTE — Progress Notes (Signed)
This nurse noted patient left hand twitching while straight cathing. Patient alert and able to follow commands. States "this feels funny." Pt asked about previous history of seizure and states "I dont know, havent had one in a while." On call neurology notified, new orders received. Vitals remain stable. Will continue to monitor. Dicie BeamFrazier, Alanzo Lamb RN BSN.

## 2015-05-24 NOTE — Consult Note (Signed)
i was called about patient's left arm jerking.  i gave 1g of keppra and 1mg  of ativan and his jerking stopped.

## 2015-05-24 NOTE — Evaluation (Signed)
Physical Therapy Evaluation Patient Details Name: DEVARIO BUCKLEW MRN: [id number] DOB: 1955/09/14 Today's Date: 05/24/2015   History of Present Illness  Patient is a 60 yo male admitted 05/22/15 with Lt sided weakness and decreased sensation.  MRI showed acute Rt MCA infarct.  Patient also with thrombus in Rt ICA.    PMH:  DM, neuropathy, PVD, bilateral AKA's, HTN, seizure disorder, CVA, carotid stenosis, tobacco use, CHF with EF 35%    Clinical Impression  Patient presents with problems listed below.  Will benefit from acute PT to maximize functional mobility prior to discharge.  Patient with no movement noted Lt UE/LE.  Requiring +2 total assist for bed mobility.  Recommend SNF at discharge for continued therapy.    Follow Up Recommendations SNF;Supervision/Assistance - 24 hour    Equipment Recommendations  None recommended by PT    Recommendations for Other Services       Precautions / Restrictions Precautions Precautions: Fall Restrictions Weight Bearing Restrictions: No      Mobility  Bed Mobility Overal bed mobility: Needs Assistance;+2 for physical assistance Bed Mobility: Rolling;Sidelying to Sit;Supine to Sit;Sit to Supine Rolling: Max assist Sidelying to sit: Total assist;+2 for physical assistance Supine to sit: Total assist;+2 for physical assistance Sit to supine: Total assist;+2 for physical assistance   General bed mobility comments: Verbal and tactile cues.  Attempted rolling and moving sidelying to sitting.  Required +2 total assist to partially raise trunk to upright position.  Patient unable to turn trunk to sit EOB.  Returned to supine.  Raised HOB and attempted to move straight up to long-sitting.  Required +2 total assist.  Patient pushing with RUE, making trunk lean to left.  Had patient relax his RUE, and trunk remained to left side.  Reuired +2 total assist to maintain balance/prevent fall.  Patient sat EOB x 3 minutes, and then assisted to supine with +2  total assist.  Transfers                    Ambulation/Gait                Stairs            Wheelchair Mobility    Modified Rankin (Stroke Patients Only) Modified Rankin (Stroke Patients Only) Pre-Morbid Rankin Score: Slight disability Modified Rankin: Severe disability     Balance Overall balance assessment: Needs assistance;History of Falls Sitting-balance support: Single extremity supported Sitting balance-Leahy Scale: Zero Sitting balance - Comments: Pushing with RUE, causing Lt lean.  With relaxed RUE, patient continued to lean to left. Required +2 total assist to maintain balance. Postural control: Left lateral lean                                   Pertinent Vitals/Pain Pain Assessment: No/denies pain    Home Living Family/patient expects to be discharged to:: Private residence Living Arrangements: Alone Available Help at Discharge: Family;Friend(s);Available PRN/intermittently Type of Home: House Home Access: Level entry;Ramped entrance     Home Layout: One level Home Equipment: Wheelchair - manual;Tub bench (Sliding board)      Prior Function Level of Independence: Independent with assistive device(s)         Comments: Wheelchair bound. Able to transfer with/without sliding board on his own.  Independent with ADL's     Hand Dominance        Extremity/Trunk Assessment   Upper Extremity Assessment: LUE  deficits/detail       LUE Deficits / Details: No movement noted.  Decreased sensation   Lower Extremity Assessment: RLE deficits/detail;LLE deficits/detail RLE Deficits / Details: AKA LLE Deficits / Details: AKA, Unable to lift Lt stump against gravity     Communication   Communication: No difficulties  Cognition Arousal/Alertness: Lethargic Behavior During Therapy: WFL for tasks assessed/performed;Flat affect Overall Cognitive Status: Within Functional Limits for tasks assessed                       General Comments      Exercises        Assessment/Plan    PT Assessment Patient needs continued PT services  PT Diagnosis Generalized weakness;Acute pain;Hemiplegia non-dominant side;Altered mental status   PT Problem List Decreased strength;Decreased activity tolerance;Decreased balance;Decreased mobility;Decreased coordination;Decreased cognition;Impaired sensation;Decreased safety awareness  PT Treatment Interventions DME instruction;Functional mobility training;Therapeutic activities;Therapeutic exercise;Balance training;Cognitive remediation;Patient/family education   PT Goals (Current goals can be found in the Care Plan section) Acute Rehab PT Goals Patient Stated Goal: None stated PT Goal Formulation: With patient Time For Goal Achievement: 05/31/15 Potential to Achieve Goals: Fair    Frequency Min 3X/week   Barriers to discharge Decreased caregiver support Patient lives alone    Co-evaluation               End of Session Equipment Utilized During Treatment: Oxygen Activity Tolerance: Patient limited by lethargy;Patient limited by fatigue Patient left: in bed;with call bell/phone within reach;with bed alarm set Nurse Communication: Mobility status;Need for lift equipment         Time: [id number] PT Time Calculation (min) (ACUTE ONLY): 19 min   Charges:   PT Evaluation $PT Eval High Complexity: 1 Procedure     PT G Codes:        Vena AustriaDavis, Simya Tercero H 05/24/2015, 5:29 PM

## 2015-05-24 NOTE — Clinical Documentation Improvement (Signed)
Neurology, CCM, Cardiology  (Please document your response in the current medical record, not on the CDI BPA form.  Thank you.)  Please document the Acuity and Type of Heart Failure monitored and treated this admission:  Acuity - Acute;  Acute on Chronic;  Chronic;  Other acuity;  Unable to clinically determine And  Type - Systolic;  Diastolic;  Combined;  Other type;  Unable to clinically determine  Clinical Information/Indicators: EF is 35%  With a pattern of mild LVH per Echo dated 05/23/15 Known history of HTN No diuretics have been given so far this admission  andPlease exercise your independent, professional judgment when responding. A specific answer is not anticipated or expected.   Thank You, Jerral Ralphathy R Sarinah Doetsch Health Information Management [place] [phone]

## 2015-05-24 NOTE — Progress Notes (Signed)
EEG completed, results pending. 

## 2015-05-24 NOTE — Progress Notes (Signed)
CRITICAL VALUE ALERT  Critical value received:  Glucose 69  Date of notification: 05/24/15  Time of notification: 2015  Critical value read back:Yes.    Nurse who received alert:  Hector BrunswickE. Anadelia Kintz RN  MD notified (1st page):  N/A  Time of first page:   N/A  MD notified (2nd page):  Time of second page:  Responding MD:   N/A  Time MD responded:   N/A   Hypogyycemic protocol followed. Orange juice administered.  Blood sugar rechecked per protocol with a result of 118. Will continue to monitor. Dicie BeamFrazier [place] Shannahan RN BSN

## 2015-05-24 NOTE — Care Management Note (Signed)
Case Management Note  Patient Details  Name: Nuala AlphaBruce L Talkington MRN: [id number] Date of Birth: [birth year]  Subjective/Objective:    Pt admitted on 05/22/15 s/p acute ischemic stroke.  PTA, pt independent, lives alone.                  Action/Plan: Will follow for discharge planning as pt progresses.    Expected Discharge Date:                  Expected Discharge Plan:  Home w Home Health Services  In-House Referral:     Discharge planning Services  CM Consult  Post Acute Care Choice:    Choice offered to:     DME Arranged:    DME Agency:     HH Arranged:    HH Agency:     Status of Service:  In process, will continue to follow  Medicare Important Message Given:    Date Medicare IM Given:    Medicare IM give by:    Date Additional Medicare IM Given:    Additional Medicare Important Message give by:     If discussed at Long Length of Stay Meetings, dates discussed:    Additional Comments:  Quintella BatonJulie W. Maximus Hoffert, RN, BSN  Trauma/Neuro ICU Case Manager [phone]

## 2015-05-24 NOTE — Progress Notes (Signed)
PROGRESS NOTE  Subjective:    Primary cardiologist:   Hilty   60 year old male with history of diabetes mellitus, diabetic neuropathy, seizures, hypertension, acid reflux, peripheral vascular disease, s/p bilat BKAs ,  CVA, carotid stenosis-carotid endarterectomy on the left, hyperlipidemia  Was found to have moderate - severe LV dysfunction   with EF 35%  Troponin levels are minimally elevated.     Objective:    Vital Signs:   Temp:  [98.1 F (36.7 C)-99.3 F (37.4 C)] 98.8 F (37.1 C) (03/17 1200) Pulse Rate:  [48-74] 64 (03/17 1200) Resp:  [9-26] 19 (03/17 1200) BP: (77-150)/(47-84) 115/58 mmHg (03/17 1200) SpO2:  [84 %-100 %] 95 % (03/17 1200) Weight:  [164 lb 0.4 oz (74.4 kg)] 164 lb 0.4 oz (74.4 kg) (03/16 1545)      24-hour weight change: Weight change: 4 lb 0.4 oz (1.825 kg)  Weight trends: Filed Weights   05/22/15 1601 05/23/15 1545  Weight: 160 lb (72.576 kg) 164 lb 0.4 oz (74.4 kg)    Intake/Output:  03/16 0701 - 03/17 0700 In: 1817.5 [I.V.:1817.5] Out: 1000 [Urine:1000] Total I/O In: 769.4 [I.V.:769.4] Out: 500 [Urine:500]   Physical Exam: BP 115/58 mmHg  Pulse 64  Temp(Src) 98.8 F (37.1 C) (Axillary)  Resp 19  Ht  (1.422 m)  Wt 164 lb 0.4 oz (74.4 kg)  BMI 36.79 kg/m2  SpO2 95%  Wt Readings from Last 3 Encounters:  05/23/15 164 lb 0.4 oz (74.4 kg)  09/15/13 184 lb (83.462 kg)  04/12/12 171 lb 4.8 oz (77.7 kg)    General: Vital signs reviewed and noted. somulent  Head: Normocephalic, atraumatic.  Eyes: conjunctivae/corneas clear.  EOM's intact.   Throat: normal  Neck:  normla   Lungs:    clear   Heart:  RR   Abdomen:  Soft, non-tender, non-distended    Extremities: S/p Bilat BKA   Neurologic: A&O X3, CN II - XII are grossly intact. , paralized onleft side  Psych: Normal     Labs: BMET:  Recent Labs  05/23/15 0917 05/24/15 0515 05/24/15 0927  NA 138 142  --   K 3.8 5.2*  --   CL 104 109  --   CO2 27  17*  --   GLUCOSE 249* 154*  --   BUN 18 17  --   CREATININE 1.79* 1.86*  --   CALCIUM 7.9* 7.7*  --   MG  --   --  1.8  PHOS  --   --  2.6    Liver function tests:  Recent Labs  05/23/15 0917  AST 18  ALT 13*  ALKPHOS 90  BILITOT 0.6  PROT 5.7*  ALBUMIN 2.1*   No results for input(s): LIPASE, AMYLASE in the last 72 hours.  CBC:  Recent Labs  05/22/15 1943 05/23/15 0917 05/24/15 0515  WBC 10.6* 8.3 8.3  NEUTROABS 7.6  --   --   HGB 15.1 13.4 13.5  HCT 45.9 41.8 41.6  MCV 81.8 83.1 85.1  PLT 330 278 242    Cardiac Enzymes:  Recent Labs  05/24/15 0200 05/24/15 0920  TROPONINI 0.05* 0.05*    Coagulation Studies: No results for input(s): LABPROT, INR in the last 72 hours.  Other: Invalid input(s): POCBNP No results for input(s): DDIMER in the last 72 hours.  Recent Labs  05/23/15 1004  HGBA1C 12.6*    Recent Labs  05/24/15 0357  CHOL 264*  HDL 30*  LDLCALC 180*  TRIG 271*  CHOLHDL 8.8   No results for input(s): TSH, T4TOTAL, T3FREE, THYROIDAB in the last 72 hours.  Invalid input(s): FREET3 No results for input(s): VITAMINB12, FOLATE, FERRITIN, TIBC, IRON, RETICCTPCT in the last 72 hours.   Other results:  Tele  ( personally reviewed )  NSR   Medications:    Infusions: . sodium chloride 100 mL/hr at 05/24/15 1300  . DOPamine 2.5 mcg/kg/min (05/24/15 1300)    Scheduled Medications: .  stroke: mapping our early stages of recovery book   Does not apply Once  . acetaminophen  325 mg Oral BID  . amitriptyline  25 mg Oral QHS  . aspirin  325 mg Oral Daily  . atorvastatin  80 mg Oral q1800  . cholecalciferol  800 Units Oral Daily  . clopidogrel  75 mg Oral Daily  . gabapentin  900 mg Oral BID  . heparin  5,000 Units Subcutaneous 3 times per day  . insulin aspart  0-15 Units Subcutaneous 6 times per day  . levETIRAcetam  1,000 mg Oral BID  . magnesium oxide  800 mg Oral Daily  . pantoprazole  40 mg Oral Daily    Assessment/  Plan:   Principal Problem:   Acute ischemic stroke [facility](HCC) Active Problems:   Type II diabetes mellitus with complication, uncontrolled (HCC)   Peripheral vascular disease due to secondary diabetes mellitus (HCC)   Seizure disorder (HCC)   CVA (cerebral infarction)   Acute kidney injury (HCC)   Below knee amputation status (HCC)   Tobacco abuse   Cerebral infarction due to unspecified mechanism   S/P AKA (above knee amputation) bilateral (HCC)   Cardiomyopathy (HCC)   Hypotension   Arterial hypotension   Right middle cerebral artery stroke (HCC)   1. Chronic systolic CHF:    Very poor candidate for invasive cardiology procedures.  Has CKD. Would continue with medical therapy  At present , his HR and BP are too low to start beta blocker or ARB/ hydranazine  Using ARB or ACE-I may be difficult due to his CKD  2. CVA:  Likely from his critical carotid disease.  Continue medical therapy     Disposition:  Length of Stay: 2  Vesta MixerPhilip J. Lateefa Crosby, Montez HagemanJr., MD, [facility] 05/24/2015, 1:11 PM Office [phone] Pager [phone]

## 2015-05-24 NOTE — Progress Notes (Signed)
STROKE TEAM PROGRESS NOTE   HISTORY OF PRESENT ILLNESS Ian Garcia is an 60 y.o. 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  He was admitted to the hospitalist service at [facility]. As part of the stroke workup he had ultrasound of the carotids done which showed a near occlusive thrombus in the right ICA. MRI of the brain was performed which was of poor quality due to motion artifacts. It Did show a posterior parietal acute right MCA infarct.  Due to the abnormality seen on the carotid ultrasound with a near occlusive acute thrombus, he was transferred to [facility] for further evaluation and management options including no any further interventions. His been transferred to the ICU. Patient is a poor historian. Reports living alone. No family available at bedside.  Date last known well: 05/21/2015 Time last known well: Unknown tPA Given: No: Outside time window   SUBJECTIVE (INTERVAL HISTORY) His RN is at the bedside.  Overall he feels his condition is stable. Still on dopamine for BP, on IVF, BP so far stable, plan to wean off dopamine. He had right near occlusion ICA, s/p left CEA and b/l AKA. Still smoking and not willing to quit.    OBJECTIVE Temp:  [98.1 F (36.7 C)-99.3 F (37.4 C)] 98.6 F (37 C) (03/17 0400) Pulse Rate:  [48-93] 63 (03/17 0600) Cardiac Rhythm:  [-] Normal sinus rhythm (03/17 0600) Resp:  [9-20] 18 (03/17 0600) BP: (77-176)/(47-93) 125/59 mmHg (03/17 0600) SpO2:  [84 %-100 %] 97 % (03/17 0600) Weight:   [74.4 kg (164 lb 0.4 oz)] 74.4 kg (164 lb 0.4 oz) (03/16 1545)  CBC:  Recent Labs Lab 05/22/15 1943 05/23/15 0917 05/24/15 0515  WBC 10.6* 8.3 8.3  NEUTROABS 7.6  --   --   HGB 15.1 13.4 13.5  HCT 45.9 41.8 41.6  MCV 81.8 83.1 85.1  PLT 330 278 242    Basic Metabolic Panel:  Recent Labs Lab 05/23/15 0917 05/24/15 0515  NA 138 142  K 3.8 5.2*  CL 104 109  CO2 27 17*  GLUCOSE 249* 154*  BUN 18 17  CREATININE 1.79* 1.86*  CALCIUM 7.9* 7.7*    Lipid Panel:    Component Value Date/Time   CHOL 264* 05/24/2015 0357   TRIG 271* 05/24/2015 0357   HDL 30* 05/24/2015 0357   CHOLHDL 8.8 05/24/2015 0357   VLDL 54* 05/24/2015 0357   LDLCALC 180* 05/24/2015 0357   HgbA1c:  Lab Results  Component Value Date   HGBA1C 12.6* 05/23/2015   Urine Drug Screen: No results found for: LABOPIA, COCAINSCRNUR, LABBENZ, AMPHETMU, THCU, LABBARB    IMAGING I have personally reviewed the radiological images below and agree with the radiology interpretations.  CT  Head Wo Contrast 05/22/2015   1. Subacute nonhemorrhagic infarction high in the right parietal lobe with secondary edema of that portion of the brain without midline shift.  2. Multiple small scalp contusions.   Mr Ian Garcia Head Wo Contrast 05/23/2015   Motion degraded. No emergent large vessel occlusion to correspond with the acute posterior right MCA infarct.   Mr Brain Wo Contrast 05/23/2015   1. Acute posterior right MCA  territory infarct corresponding to the abnormal CT appearance yesterday. Cytotoxic edema without intracranial mass effect at this time.  2. Intracranial MRA from today reported separately.  3. Moderately to severely motion degraded and the examination had to be discontinued prior to completion due to patient agitation.  4. Consider surveillance for associated intracranial hemorrhage and mass effect with noncontrast CT.   US Renal 05/23/2015   1. No hydronephrosis.  2. Echogenic normal size kidneys,  indicating nonspecific renal parenchymal disease of uncertain chronicity.  3. Mild diffuse bladder wall thickening and trabeculation, suggesting chronic bladder voiding dysfunction such as due to chronic bladder outlet obstruction by an enlarged prostate gland. The prostate gland is not evaluated on this scan.   US Carotid Bilateral 05/23/2015   Right: Nearly occlusive centralized thrombus/plaque within the right common carotid artery which contributes to post stenotic and decreased antegrade flow of the right ICA. This plaque is favored to represent acute embolism/thrombus given the morphology. Formal cerebral angiogram is recommended. As a second best test, CTA. Left: The patient is status post left carotid endarterectomy in September 2011, with atherosclerotic changes of the common carotid artery. Established duplex criteria have not been validated for assessment after surgery, however, there is no evidence of compromised flow. High diastolic component of the flow on the left suggests increase contribution, potentially to the right hemisphere given the small infarction on recent CT.   EEG - This is an abnormal EEG demonstrating posterior slowing on the right compared to that of the left, consistent with the patient's history of a new right PCA infarct. There was no epileptiform activity recorded on this recording.  2D echo - Upper normal LV chamber size with mild LVH and LVEF approximately  35%. There is diffuse hypokinesis with akinesis of the basal  inferolateral/inferior wall. Grade 1 diastolic dysfunction with  indeterminate filling pressure. Upper normal left atrial chamber  size. Trivial mitral regurgitation. No obvious PFO or ASD.   PHYSICAL EXAM  Temp:  [98.1 F (36.7 C)-99.3 F (37.4 C)] 99 F (37.2 C) (03/17 1600) Pulse Rate:  [58-74] 67 (03/17 1900) Resp:  [11-26] 22 (03/17 1900) BP: (104-150)/(51-70) 127/54 mmHg (03/17 1900) SpO2:  [88 %-99 %] 96 % (03/17 1900)  General  - cachetic looking, well developed, in no apparent distress.  Ophthalmologic - Fundi not visualized due to noncooperation.  Cardiovascular - Regular rate and rhythm.  Mental Status -  Level of arousal and orientation to time, place, and person were intact. Language including expression, naming, repetition, comprehension was assessed and found intact. Fund of Knowledge was assessed and was intact.  Cranial Nerves II - XII - II - inconsistent exam, questionable left hemianopia. III, IV, VI - difficulty with bilateral lateral gaze, eyes mid position. V - Facial sensation intact bilaterally. VII - Facial movement intact bilaterally. VIII - Hearing & vestibular intact bilaterally. X - Palate elevates symmetrically. XI - Chin turning & shoulder shrug intact bilaterally. XII - Tongue protrusion intact.  Motor Strength - The patient's strength was 0/5 LUE and left leg stump, 5/5 RUE and right leg stump, bilateral AKA.  Bulk was normal and fasciculations were absent.   Motor Tone - Muscle tone was assessed at the neck and appendages and was normal.  Reflexes - The patient's reflexes were 1+ in UEs  Sensory - Light touch, temperature/pinprick were assessed and were symmetrical.    Coordination - The patient had normal movements in the right hand with no ataxia or dysmetria.  Tremor was absent.  Gait and Station - not able to test.   ASSESSMENT/PLAN Ian Garcia is a 60 y.o. male with history of DM, seizures, htn, previous stroke, hld, and carotid stenosis presenting with acute onset of left-sided weakness. He did not receive IV t-PA due to late presentation.  Stroke:  Non-dominant infarct probably embolic from right carotid artery stenosis.  Resultant  Left hemiplegia  MRI  Acute posterior right MCA territory infarct with cytotoxic edema  MRA  No emergent large vessel occlusion to correspond with the acute posterior right MCA infarct.   Carotid Doppler - near occlusion of  right carotid artery.  2D Echo - EF 35%. No PFO. No cardiac source of emboli identified.  LDL - 180  HgbA1c 12.6  VTE prophylaxis - subcutaneous heparin  Diet NPO time specified  clopidogrel 75 mg daily prior to admission, now on aspirin 325 mg daily and clopidogrel 75 mg daily. Continue DAPT.   Patient counseled to be compliant with his antithrombotic medications  Ongoing aggressive stroke risk factor management  Therapy recommendations: Pending  Disposition: Pending  Right ICA near occlusion  Cause of right MCA infarct  Low BP on dopamine  TTE EF 35%  On IVF, BP goal 130-150  Please call vascular surgery for consultation but doubt acute intervention.   Hypotension   On IVF  On dopamine  Wean off dopamine as able  May consider midodrine and florinef if still low BP  EF 35%  CCM on board  Cardiomyopathy   EF 35%  Low BP  ? Seizure  Hx of seizure on keppra   Continue keppra 1000mg  bid  Reported event of left arm shaking over night. Received ativan  EEG showed right slowing, no seizure  Hyperlipidemia  Home meds:  Lipitor 80 mg daily resumed in hospital  LDL 180, goal < 70  Consider adding Zetia.  Continue statin at discharge  Diabetes  HgbA1c 12.6, goal < 7.0  Uncontrolled  Noncompliance  Tobacco abuse  Current smoker  Smoking cessation counseling provided  Pt is NOT willing to quit  Other Stroke Risk Factors  Hx stroke/TIA  S/p left CEA  Other Active Problems  Elevated creatinine 1.86  Hyperkalemia 5.2 - repeat in AM  Urine culture pending  B/l AKA   Hospital day # 2  This patient is critically ill due to right ICA near occlusion, low BP, cardiomyopathy, seizure and at significant risk of neurological worsening, death form recurrent stroke, hemorrhagic transformation, status epileptica, heart failure. This patient's care requires constant monitoring of vital signs, hemodynamics, respiratory and cardiac monitoring,  review of multiple databases, neurological assessment, discussion with family, other specialists and medical decision making of high complexity. I spent 40 minutes of neurocritical care time in the care of this patient.   Marvel PlanJindong Miasia Crabtree, MD PhD Stroke Neurology 05/24/2015 8:59 PM      To contact Stroke Continuity provider, please refer to [email or web address]. After hours, contact General Neurology

## 2015-05-24 NOTE — Progress Notes (Addendum)
Spoke with patient about diabetes and home regimen for diabetes control. Patient reports that [facility].  Currently as an outpatient for diabetes control he takes Lantus 30 units BID and Regular 10 units with meals (at least 3 times per day and any other time he eats or drinks something with carbohydrates) and he also takes oral DM medication but does not recall the name of them. According to the home medication list, he is taking Glipizide 10 mg BID and Metformin 500 mg BID. Patient reports that he is taking all DM medications as prescribed.  Patient states that he checks his glucose 1 time per day as instructed by his doctor and that no matter when he checks his glucose it is most always greater than 300 mg/dl.  Inquired about prior A1C and patient reports that he does not recall his last A1C value. Discussed A1C results (12.6% on 05/23/2015) and explained that his current A1C indicates an average glucose of 315 mg/dl over the past 2-3 months. Reviewed what an A1C is and discussed glucose and A1C goals. Discussed importance of checking CBGs and maintaining good CBG control to prevent long-term and short-term complications. Explained how hyperglycemia leads to damage within blood vessels which lead to the common complications seen with uncontrolled diabetes. Stressed to the patient the importance of improving glycemic control to prevent further complications from uncontrolled diabetes. Discussed impact of nutrition, exercise, stress, sickness, and medications on diabetes control.  Discussed carbohydrates, carbohydrate goals per day and meal, along with portion sizes. Encouraged patient to check his glucose 3-4 times per day (before meals and at bedtime) and to keep a log book of glucose readings and insulin taken which he will need to take to doctor appointments. Explained how the doctor he follows up with can use the log book to continue to make insulin  adjustments if needed. Encouraged patient to talk with his doctor at the [facility] about being referred to an Endocrinologist to help with getting diabetes controlled.  Patient verbalized understanding of information discussed and he states that he has no further questions at this time related to diabetes.  Thanks, Orlando PennerMarie Meghan Warshawsky, RN, MSN, CDE Diabetes Coordinator Inpatient Diabetes Program [phone] (Team Pager) [phone] (AP office) [phone] [facility] office) [phone] [facility] office)

## 2015-05-24 NOTE — Progress Notes (Addendum)
PULMONARY / CRITICAL CARE MEDICINE   Name: Ian Garcia MRN: [id number] DOB: 12/21/1955    ADMISSION DATE:  05/22/2015 CONSULTATION DATE:  05/23/2015  REFERRING MD:  Dr. Lavon Paganini with neurology  CHIEF COMPLAINT:  Hypotension in setting of acute ischemic stroke  HISTORY OF PRESENT ILLNESS:   60 year old male with a past medical history significant for diabetes mellitus, vascular disease status post bilateral above-the-knee amputation came to the [facility] on 05/22/2014 complaining of new onset left-sided weakness which started the day prior to admission.He was transferred to [facility] on 05/23/2015 for a possible angiogram. Pulmonary and critical care medicine was consulted for persistent hypotension and request for assistance with vasopressors.  PAST MEDICAL HISTORY :  He  has a past medical history of Diabetes mellitus (HCC); Diabetic neuropathy (HCC); Seizures (HCC); Hypertension; Acid reflux; Hypercholesteremia; Peripheral vascular disease due to secondary diabetes mellitus (HCC); CVA (cerebral infarction); Carotid stenosis; and Glaucoma.  PAST SURGICAL HISTORY: He  has past surgical history that includes Eye surgery; Left femoral to tibial bypass (03/16/2011); Amputation left great toe (02/2012); and CEA.  Allergies  Allergen Reactions  . Flagyl [Metronidazole] Itching and Rash  . Penicillins Itching and Rash    No current facility-administered medications on file prior to encounter.   Current Outpatient Prescriptions on File Prior to Encounter  Medication Sig  . acetaminophen (TYLENOL) 325 MG tablet Take 325 mg by mouth 2 (two) times daily.  Marland Kitchen amitriptyline (ELAVIL) 25 MG tablet Take 25 mg by mouth at bedtime.  . cholecalciferol (VITAMIN D) 400 UNITS TABS tablet Take 800 Units by mouth daily.  . clopidogrel (PLAVIX) 75 MG tablet Take 75 mg by mouth daily.  Marland Kitchen gabapentin (NEURONTIN) 300 MG capsule Take 900 mg by mouth 2 (two) times daily.  Marland Kitchen  HYDROcodone-acetaminophen (NORCO/VICODIN) 5-325 MG per tablet Take 1 tablet by mouth every 6 (six) hours as needed. pain  . magnesium oxide (MAG-OX) 400 MG tablet Take 800 mg by mouth daily.  . metoprolol (LOPRESSOR) 50 MG tablet Take 50 mg by mouth 2 (two) times daily.  Marland Kitchen atorvastatin (LIPITOR) 80 MG tablet Take 80 mg by mouth daily.  Marland Kitchen levETIRAcetam (KEPPRA) 750 MG tablet Take 750 mg by mouth 2 (two) times daily.  Marland Kitchen losartan (COZAAR) 50 MG tablet Take 50 mg by mouth daily.  Marland Kitchen omeprazole (PRILOSEC) 20 MG capsule Take 40 mg by mouth daily.    FAMILY HISTORY:  His has no family status information on file.   SOCIAL HISTORY: He  reports that he has been smoking.  He does not have any smokeless tobacco history on file. He reports that he does not drink alcohol or use illicit drugs.  REVIEW OF SYSTEMS:   Unable to obtain, pt somnolent.  SUBJECTIVE:  As above  VITAL SIGNS: BP 128/70 mmHg  Pulse 74  Temp(Src) 98.1 F (36.7 C) (Oral)  Resp 15  Ht  (1.422 m)  Wt 74.4 kg (164 lb 0.4 oz)  BMI 36.79 kg/m2  SpO2 96%  HEMODYNAMICS:    VENTILATOR SETTINGS:    INTAKE / OUTPUT: I/O last 3 completed shifts: In: 1817.5 [I.V.:1817.5] Out: 1000 [Urine:1000]  PHYSICAL EXAMINATION: General: Adult male, resting in bed, in NAD. Neuro: Somnolent.  Flaccid on LUE and LLE. HEENT: [place]/AT. PERRL, sclerae anicteric. Cardiovascular: RRR, no M/R/G.  Lungs: Respirations even and unlabored.  CTA bilaterally, No W/R/R. Abdomen: BS x 4, soft, NT/ND.  Musculoskeletal: Bilateral AKA's, no edema.  Skin: Intact, warm, no rashes.  LABS:  BMET  Recent Labs Lab 05/22/15 1943 05/23/15 0917 05/24/15 0515  NA 134* 138 142  K 3.6 3.8 5.2*  CL 99* 104 109  CO2 28 27 17*  BUN 18 18 17   CREATININE 1.81* 1.79* 1.86*  GLUCOSE 266* 249* 154*    Electrolytes  Recent Labs Lab 05/22/15 1943 05/23/15 0917 05/24/15 0515  CALCIUM 8.2* 7.9* 7.7*    CBC  Recent Labs Lab 05/22/15 1943  05/23/15 0917 05/24/15 0515  WBC 10.6* 8.3 8.3  HGB 15.1 13.4 13.5  HCT 45.9 41.8 41.6  PLT 330 278 242    Coag's No results for input(s): APTT, INR in the last 168 hours.  Sepsis Markers No results for input(s): LATICACIDVEN, PROCALCITON, O2SATVEN in the last 168 hours.  ABG No results for input(s): PHART, PCO2ART, PO2ART in the last 168 hours.  Liver Enzymes  Recent Labs Lab 05/23/15 0917  AST 18  ALT 13*  ALKPHOS 90  BILITOT 0.6  ALBUMIN 2.1*    Cardiac Enzymes  Recent Labs Lab 05/24/15 0200  TROPONINI 0.05*    Glucose  Recent Labs Lab 05/23/15 1455 05/23/15 1641 05/23/15 1925 05/23/15 2341 05/24/15 0334 05/24/15 0800  GLUCAP 104* 89 102* 147* 173* 136*    Imaging Mr Maxine GlennMra Head Wo Contrast  05/23/2015  CLINICAL DATA:  60 year old male with altered mental status and left side weakness for 1 day. Agitation requiring exam discontinuation prior to completion. Initial encounter. EXAM: MRA HEAD WITHOUT CONTRAST TECHNIQUE: Angiographic images of the Circle of Willis were obtained using MRA technique without intravenous contrast. COMPARISON:  Motion degraded brain MRI from today reported separately. Noncontrast head CT 05/22/2015. FINDINGS: Study is moderately degraded by motion artifact despite repeated imaging attempts. Antegrade flow signal in the basilar artery. The basilar tip in proximal PCAs are patent with preserved flow signal. Antegrade flow signal in both ICA siphons. Both carotid termini appear patent with flow signal. Patent MCA and ACA origins. The right MCA M1 segment and bifurcation appear remain patent. Likewise, proximal left MCA and ACA is appear patent. Motion artifact otherwise degrades intracranial vessel detail. IMPRESSION: Motion degraded. No emergent large vessel occlusion to correspond with the acute posterior right MCA infarct. Electronically Signed   By: Odessa FlemingH  Hall M.D.   On: 05/23/2015 11:23   Mr Brain Wo Contrast  05/23/2015  CLINICAL DATA:   60 year old male with altered mental status and left side weakness for 1 day. Agitation requiring exam discontinuation prior to completion. Initial encounter. EXAM: MRI HEAD WITHOUT CONTRAST TECHNIQUE: Multiplanar, multiecho pulse sequences of the brain and surrounding structures were obtained without intravenous contrast. COMPARISON:  Head and cervical spine CT without contrast 05/22/2015. Brain MRI 11/20/2009. FINDINGS: Study is moderately to severely degraded by motion artifact despite repeated imaging attempts. Also the examination had to be discontinued prior to completion due to patient agitation. Only motion degraded axial diffusion, coronal diffusion, axial T2 imaging, and sagittal T1 weighted imaging was obtained 5-6 cm area of confluent restricted diffusion in the posterior superior right hemisphere, corresponding to posterior right MCA vascular territory. Several small areas of cortical restricted diffusion superimposed in the lateral right occipital lobe. No contralateral or posterior fossa restricted diffusion is evident. Basilar cisterns remain patent. No ventriculomegaly. Mild cytotoxic edema evident on T1 weighted imaging at this time. IMPRESSION: 1. Acute posterior right MCA territory infarct corresponding to the abnormal CT appearance yesterday. Cytotoxic edema without intracranial mass effect at this time. 2. Intracranial MRA from today reported separately. 3. Moderately to  severely motion degraded and the examination had to be discontinued prior to completion due to patient agitation. 4. Consider surveillance for associated intracranial hemorrhage and mass effect with noncontrast CT. Electronically Signed   By: Odessa Fleming M.D.   On: 05/23/2015 11:21     STUDIES:  March 16 MRI brain: Acute posterior right MCA territory infarct corresponding to the abnormal CT scan performed one day prior, cytotoxic edema without mass effect March 16 MRA brain: Motion degraded no emergent large vessel  occlusion March 16 carotid ultrasound: Nearly occlusive centralized thrombus/plaque within the right common carotid artery, Favored to represent acute embolism formal cerebral angiogram recommended, status post left carotid endarterectomy March 16 Echo> LVEF 35%, diffuse hypokinesis with akinesis basal/inferiolateral wall, trival MR  CULTURES: none  ANTIBIOTICS: None  SIGNIFICANT EVENTS: 05/22/2014 admission 05/23/2015 transferred to [facility]  LINES/TUBES None  ASSESSMENT / PLAN: 59 year old male with a past medical history significant for prior stroke, significant peripheral vascular disease admitted with acute CVA symptoms since 3/14.  Brought to [facility] on 3/16 for cerebral angiogram given possible emboli in R common carotid artery.  PCCM consulted for vasopressors in setting of recent stroke and ongoing hypotension.   NEUROLOGIC A:   Acute Ischemic Stroke> symptoms started on 3/14. Severe carotid stenoses on [place] P:   Post stroke management per neurology. CT Angio head and neck ordered Plavix  CARDIOVASCULAR A:  Hypotension, due to cardiogenic shock?.  Echo from 03/16 revealed LVEF 35%, diffuse hypokinesis with akinesis basal/inferiolateral wall, trival MR. Systolic heart failure> new onset? - see echo report above. Baseline history of hypertension. HLD - total cholesterol 295, Trigs 292, HDL 31, LDL 206. P:  Will start dopamine for neurology's stated goal of SBP > 110, MAP 70. Hopefully can begin to wean 3/17 Tele monitoring. Cards following, on medical therapy for CAD, not a heparin candidate in setting acute CVA Trend troponins, negative so far Check cx's, lactate  PULMONARY A: No acute issues. P:   Monitor O2 saturation.  RENAL A:   Chronic kidney disease. Pseudohypocalcemia - corrects to 9.42. P:   Would avoid contrast dye unless there is a clear benefit as risk of AKI high Assess ionized calcium. Follow BMP  GASTROINTESTINAL A:    Nutrition. P:   NPO.  HEMATOLOGIC A:   VTE prophylaxis. P:  Heparin sq Follow CBC  INFECTIOUS A:   No indication of infection. P:   Monitor clinically.  ENDOCRINE A:   DM. P:   SSI.   FAMILY  - Updates: None.  - Inter-disciplinary family meet or Palliative Care meeting due by:  03/23.  CC time:  35 minutes.  Levy Pupa, MD, PhD 05/24/2015, 10:24 AM [facility] [phone] or if no answer [phone]

## 2015-05-25 DIAGNOSIS — E1351 Other specified diabetes mellitus with diabetic peripheral angiopathy without gangrene: Secondary | ICD-10-CM

## 2015-05-25 DIAGNOSIS — I6521 Occlusion and stenosis of right carotid artery: Secondary | ICD-10-CM

## 2015-05-25 LAB — GLUCOSE, CAPILLARY
GLUCOSE-CAPILLARY: 109 mg/dL — AB (ref 65–99)
GLUCOSE-CAPILLARY: 213 mg/dL — AB (ref 65–99)
Glucose-Capillary: 148 mg/dL — ABNORMAL HIGH (ref 65–99)
Glucose-Capillary: 95 mg/dL (ref 65–99)
Glucose-Capillary: 167 mg/dL — ABNORMAL HIGH (ref 65–99)
Glucose-Capillary: 209 mg/dL — ABNORMAL HIGH (ref 65–99)
Glucose-Capillary: 250 mg/dL — ABNORMAL HIGH (ref 65–99)

## 2015-05-25 LAB — BASIC METABOLIC PANEL
ANION GAP: 9 (ref 5–15)
BUN: 12 mg/dL (ref 6–20)
CALCIUM: 7.7 mg/dL — AB (ref 8.9–10.3)
CO2: 22 mmol/L (ref 22–32)
CREATININE: 1.61 mg/dL — AB (ref 0.61–1.24)
Chloride: 111 mmol/L (ref 101–111)
GFR calc Af Amer: 53 mL/min — ABNORMAL LOW (ref >60)
GFR, EST NON AFRICAN AMERICAN: 46 mL/min — AB (ref >60)
GLUCOSE: 105 mg/dL — AB (ref 65–99)
Potassium: 3.7 mmol/L (ref 3.5–5.1)
Sodium: 142 mmol/L (ref 135–145)

## 2015-05-25 LAB — HEMOGLOBIN A1C
Hgb A1c MFr Bld: 12.8 % — ABNORMAL HIGH (ref 4.8–5.6)
Mean Plasma Glucose: 321 mg/dL

## 2015-05-25 LAB — LACTIC ACID, PLASMA: LACTIC ACID, VENOUS: 0.6 mmol/L (ref 0.5–2.0)

## 2015-05-25 LAB — CALCIUM, IONIZED: CALCIUM, IONIZED, SERUM: 4.5 mg/dL (ref 4.5–5.6)

## 2015-05-25 MED ORDER — CARVEDILOL 3.125 MG PO TABS
3.1250 mg | ORAL_TABLET | Freq: Two times a day (BID) | ORAL | Status: DC
  Administered 2015-05-25 – 2015-06-04 (×18): 3.125 mg via ORAL
  Filled 2015-05-25 (×19): qty 1

## 2015-05-25 MED ORDER — ACETAMINOPHEN 325 MG PO TABS
650.0000 mg | ORAL_TABLET | Freq: Four times a day (QID) | ORAL | Status: DC | PRN
  Administered 2015-05-26 – 2015-06-05 (×7): 650 mg via ORAL
  Filled 2015-05-25 (×7): qty 2

## 2015-05-25 MED ORDER — OXYCODONE-ACETAMINOPHEN 5-325 MG PO TABS
1.0000 | ORAL_TABLET | Freq: Once | ORAL | Status: AC | PRN
  Administered 2015-05-25: 1 via ORAL
  Filled 2015-05-25: qty 1

## 2015-05-25 NOTE — Consult Note (Signed)
Called about blood pressure control as there are no PRN orders for BP which is recommended 130-150 per notes The BP is only slightly above the recommended range.  He has a headache currently and i suspect that his BP might be slightly elevated because of this.  Will treat pain with tylenol and 1 time percocet PRN if tylenol fails to control the BP. I am afraid to tx bp given his narrow right carotid as lowering BP too abruptly with an IV PRN med could possibly precipitate a flow failure-induced right sided stroke.  Consider CTA in am perhaps with a reduced contrast load.

## 2015-05-25 NOTE — Progress Notes (Signed)
Pt off dopamine, working with SLP and PT.  PCCM will sign off. Please call if we can assist you.  Levy Pupaobert Neesa Knapik, MD, PhD 05/25/2015, 10:00 AM [facility] [phone] or if no answer [phone]

## 2015-05-25 NOTE — Evaluation (Signed)
Speech Language Pathology Evaluation Patient Details Name: Ian Garcia MRN: [id number] DOB: 1955/03/28 Today's Date: 05/25/2015 Time: 0916-0950 SLP Time Calculation (min) (ACUTE ONLY): 34 min  Problem List:  Patient Active Problem List   Diagnosis Date Noted  . 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 Acute kidney failure (HCC)   . Cerebral infarction due to unspecified mechanism   . S/P AKA (above knee amputation) bilateral (HCC)   . Cardiomyopathy (HCC)   . Hypotension   . Arterial hypotension   . Acute ischemic stroke (HCC) 05/22/2015  . CVA (cerebral infarction) 05/22/2015  . Acute kidney injury (HCC) 05/22/2015  . Below knee amputation status (HCC) 05/22/2015  . Tobacco abuse 05/22/2015  . Abnormality of gait 06/08/2013  . Difficulty in walking(719.7) 02/09/2013  . Gas gangrene of lower extremity (HCC) 04/11/2012  . Cellulitis and abscess of foot 04/11/2012  . Osteomyelitis of metatarsal (HCC) 04/11/2012  . Type II diabetes mellitus with complication, uncontrolled (HCC) 04/11/2012  . Hyponatremia 04/11/2012  . Anemia 04/11/2012  . Peripheral vascular disease due to secondary diabetes mellitus (HCC) 04/11/2012  . Seizure disorder (HCC) 04/11/2012   Past Medical History:  Past Medical History  Diagnosis Date  . Diabetes mellitus (HCC)   . Diabetic neuropathy (HCC)   . Seizures (HCC)   . Hypertension   . Acid reflux   . Hypercholesteremia   . Peripheral vascular disease due to secondary diabetes mellitus (HCC)   . CVA (cerebral infarction)   . Carotid stenosis     s/p CEA on the left  . Glaucoma    Past Surgical History:  Past Surgical History  Procedure Laterality Date  . Eye surgery    . Left femoral to tibial bypass  03/16/[facility]  . Amputation left great toe  02/2012    [facility]  . Cea      On the left   HPI:  60 y.o. male with h/o DM, diabetic neuropathy, seizures, HTN, acid reflux, hypercholesteremia,  peripheral vascular disease due to secondary DM, CVA, carotid stenosis s/p CEA on L and tobacco abuse, who presented to ED with acute L side weakness since 3/14. MR Brain 3/16 acute posterior R MCA territory infarct. MRA Head 3/16 no emergent large vessel occlusion to correspond with acute posterior R MCA infarct.   Assessment / Plan / Recommendation Clinical Impression  SLP administered Cognistat standardized assessment to evaluate speech, language and cognition. Pt oriented to person and situation, however believes he is at [facility] in [place]. Storage and retrieval deficits noted during memory subtest. Slurred speech observed throughout session and will be further monitored during diagnostic tx. Pt educated re: results of Cognistat and need for further SLP intervention. SLP will f/u to provide tx for memory, orientation and motor speech.    SLP Assessment  Patient needs continued Speech Lanaguage Pathology Services    Follow Up Recommendations   (TBD)    Frequency and Duration min 2x/week  2 weeks      SLP Evaluation Prior Functioning  Cognitive/Linguistic Baseline: Within functional limits Type of Home: House  Lives With: Alone Available Help at Discharge: Family;Friend(s);Available PRN/intermittently   Cognition  Overall Cognitive Status: Impaired/Different from baseline Arousal/Alertness: Awake/alert Orientation Level: Oriented to person;Oriented to situation;Disoriented to place;Disoriented to time Attention: Sustained Sustained Attention: Appears intact Memory: Impaired Memory Impairment: Storage deficit;Retrieval deficit Awareness: Appears intact Problem Solving: Appears intact Executive Function: Reasoning Reasoning: Appears intact Safety/Judgment: Appears intact  Comprehension  Auditory Comprehension Overall Auditory Comprehension: Appears within functional limits for tasks assessed Yes/No Questions: Within Functional Limits Commands: Within  Functional Limits Conversation: Simple Visual Recognition/Discrimination Discrimination: Not tested Reading Comprehension Reading Status: Not tested    Expression Expression Primary Mode of Expression: Verbal Verbal Expression Overall Verbal Expression: Appears within functional limits for tasks assessed Initiation: No impairment Level of Generative/Spontaneous Verbalization: Conversation Repetition: No impairment Naming: No impairment Pragmatics: No impairment Non-Verbal Means of Communication: Not applicable Written Expression Dominant Hand: Right Written Expression: Not tested   Oral / Motor  Oral Motor/Sensory Function Overall Oral Motor/Sensory Function: Within functional limits Motor Speech Overall Motor Speech: Impaired Respiration: Within functional limits Phonation: Normal Resonance: Within functional limits Articulation: Within functional limitis Intelligibility: Intelligibility reduced Word: 75-100% accurate Phrase: 75-100% accurate Sentence: 75-100% accurate Conversation: 75-100% accurate Motor Planning: Witnin functional limits Motor Speech Errors: Aware;Consistent Effective Techniques: Slow rate   Ian Garcia                    [name] 05/25/2015, 10:14 AM  Lynita LombardLauren Meckenzie Balsley, Student-SLP

## 2015-05-25 NOTE — Progress Notes (Signed)
PROGRESS NOTE  Subjective:    No events overnight. Being evaluated by vascular surgery for possible right CEA. Hypotension has resolved - off dopamine.   Objective:    Vital Signs:   Temp:  [97.5 F (36.4 C)-99 F (37.2 C)] 98.6 F (37 C) (03/18 1200) Pulse Rate:  [62-83] 83 (03/18 1400) Resp:  [0-26] 17 (03/18 1400) BP: (94-167)/(47-86) 145/64 mmHg (03/18 1400) SpO2:  [93 %-99 %] 94 % (03/18 1400)      24-hour weight change: Weight change:   Weight trends: Filed Weights   05/22/15 1601 05/23/15 1545  Weight: 160 lb (72.576 kg) 164 lb 0.4 oz (74.4 kg)    Intake/Output:  03/17 0701 - 03/18 0700 In: 2589.8 [I.V.:2589.8] Out: 1450 [Urine:1450] Total I/O In: 700 [I.V.:700] Out: 425 [Urine:425]   Physical Exam: BP 145/64 mmHg  Pulse 83  Temp(Src) 98.6 F (37 C) (Oral)  Resp 17  Ht 4\' 8"  (1.422 m)  Wt 164 lb 0.4 oz (74.4 kg)  BMI 36.79 kg/m2  SpO2 94%  Wt Readings from Last 3 Encounters:  05/23/15 164 lb 0.4 oz (74.4 kg)  09/15/13 184 lb (83.462 kg)  04/12/12 171 lb 4.8 oz (77.7 kg)    General: Vital signs reviewed and noted. somulent  Head: Normocephalic, atraumatic.  Eyes: conjunctivae/corneas clear.  EOM's intact.   Throat: normal  Neck:  normla   Lungs:    clear   Heart:  RR   Abdomen:  Soft, non-tender, non-distended    Extremities: S/p Bilat AKA   Neurologic: A&O X3, CN II - XII are grossly intact. , paralized onleft side  Psych: Normal     Labs: BMET:  Recent Labs  05/24/15 0515 05/24/15 0927 05/25/15 0343  NA 142  --  142  K 5.2*  --  3.7  CL 109  --  111  CO2 17*  --  22  GLUCOSE 154*  --  105*  BUN 17  --  12  CREATININE 1.86*  --  1.61*  CALCIUM 7.7*  --  7.7*  MG  --  1.8  --   PHOS  --  2.6  --     Liver function tests:  Recent Labs  05/23/15 0917  AST 18  ALT 13*  ALKPHOS 90  BILITOT 0.6  PROT 5.7*  ALBUMIN 2.1*   No results for input(s): LIPASE, AMYLASE in the last 72 hours.  CBC:  Recent  Labs  05/22/15 1943 05/23/15 0917 05/24/15 0515  WBC 10.6* 8.3 8.3  NEUTROABS 7.6  --   --   HGB 15.1 13.4 13.5  HCT 45.9 41.8 41.6  MCV 81.8 83.1 85.1  PLT 330 278 242    Cardiac Enzymes:  Recent Labs  05/24/15 0200 05/24/15 0920  TROPONINI 0.05* 0.05*    Coagulation Studies: No results for input(s): LABPROT, INR in the last 72 hours.  Other: Invalid input(s): POCBNP No results for input(s): DDIMER in the last 72 hours.  Recent Labs  05/24/15 0515  HGBA1C 12.8*    Recent Labs  05/24/15 0357  CHOL 264*  HDL 30*  LDLCALC 180*  TRIG 271*  CHOLHDL 8.8   No results for input(s): TSH, T4TOTAL, T3FREE, THYROIDAB in the last 72 hours.  Invalid input(s): FREET3 No results for input(s): VITAMINB12, FOLATE, FERRITIN, TIBC, IRON, RETICCTPCT in the last 72 hours.   Other results:  NSR on telemetry  Medications:    Infusions: . sodium chloride 100 mL/hr at 05/25/15 1400  .  DOPamine Stopped (05/24/15 1500)    Scheduled Medications: .  stroke: mapping our early stages of recovery book   Does not apply Once  . acetaminophen  325 mg Oral BID  . amitriptyline  25 mg Oral QHS  . aspirin  325 mg Oral Daily  . atorvastatin  80 mg Oral q1800  . cholecalciferol  800 Units Oral Daily  . clopidogrel  75 mg Oral Daily  . gabapentin  900 mg Oral BID  . heparin  5,000 Units Subcutaneous 3 times per day  . insulin aspart  0-15 Units Subcutaneous 6 times per day  . levETIRAcetam  1,000 mg Oral BID  . magnesium oxide  800 mg Oral Daily  . pantoprazole  40 mg Oral Daily    Assessment/ Plan:   Principal Problem:   Acute ischemic stroke [facility]) Active Problems:   Type II diabetes mellitus with complication, uncontrolled (HCC)   Peripheral vascular disease due to secondary diabetes mellitus (HCC)   Seizure disorder (HCC)   CVA (cerebral infarction)   Acute kidney injury (HCC)   Below knee amputation status (HCC)   Tobacco abuse   Cerebral infarction due to  unspecified mechanism   S/P AKA (above knee amputation) bilateral (HCC)   Cardiomyopathy (HCC)   Hypotension   Arterial hypotension   Right middle cerebral artery stroke (HCC)  PLAN:  1. Systolic CHF:    LVEF 35%. Very poor candidate for invasive cardiology procedures. Has CKD. Creatinine mildly improved today to 1.6. Not able to add ACE-I/ARB due to this. On aspirin and plavix. Start low dose coreg 3.125 mg BID as HR has improved up to the 70-80 range. 2. Embolic stroke:  Likely from his critical carotid disease. Seen by vascular surgery, plan for possible CT and surgery more than 1 week from now.  Chrystie Nose, MD, [facility] Attending Cardiologist [facility]  05/25/2015, 3:14 PM

## 2015-05-25 NOTE — Progress Notes (Signed)
STROKE TEAM PROGRESS NOTE   HISTORY OF PRESENT ILLNESS Ian Garcia is an 60 y.o. 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  He was admitted to the hospitalist service at [facility]. As part of the stroke workup he had ultrasound of the carotids done which showed a near occlusive thrombus in the right ICA. MRI of the brain was performed which was of poor quality due to motion artifacts. It Did show a posterior parietal acute right MCA infarct.  Due to the abnormality seen on the carotid ultrasound with a near occlusive acute thrombus, he was transferred to [facility] for further evaluation and management options including no any further interventions. His been transferred to the ICU. Patient is a poor historian. Reports living alone. No family available at bedside.  Date last known well: 05/21/2015 Time last known well: Unknown tPA Given: No: Outside time window   SUBJECTIVE (INTERVAL HISTORY) His RN is at the bedside.  Overall he feels his condition is stable. Off of Dopamine.  He had right near occlusion ICA, s/p left CEA and b/l AKA. Still smoking and not willing to quit. Appreciate vascular and cardiology recommendations, will start coreg low dose for systolic CHF.     OBJECTIVE Temp:  [97.5 F (36.4 C)-99 F (37.2 C)] 98.6 F (37 C) (03/18 1200) Pulse Rate:  [62-83] 70 (03/18 1200) Cardiac Rhythm:  [-] Normal sinus rhythm;Other (Comment) (03/18 0800) Resp:  [0-26] 26 (03/18 1200) BP: (94-167)/(47-86) 138/72 mmHg (03/18  1200) SpO2:  [93 %-99 %] 96 % (03/18 1200)  CBC:   Recent Labs Lab 05/22/15 1943 05/23/15 0917 05/24/15 0515  WBC 10.6* 8.3 8.3  NEUTROABS 7.6  --   --   HGB 15.1 13.4 13.5  HCT 45.9 41.8 41.6  MCV 81.8 83.1 85.1  PLT 330 278 242    Basic Metabolic Panel:   Recent Labs Lab 05/24/15 0515 05/24/15 0927 05/25/15 0343  NA 142  --  142  K 5.2*  --  3.7  CL 109  --  111  CO2 17*  --  22  GLUCOSE 154*  --  105*  BUN 17  --  12  CREATININE 1.86*  --  1.61*  CALCIUM 7.7*  --  7.7*  MG  --  1.8  --   PHOS  --  2.6  --     Lipid Panel:     Component Value Date/Time   CHOL 264* 05/24/2015 0357   TRIG 271* 05/24/2015 0357   HDL 30* 05/24/2015 0357   CHOLHDL 8.8 05/24/2015 0357   VLDL 54* 05/24/2015 0357   LDLCALC 180* 05/24/2015 0357   HgbA1c:  Lab Results  Component Value Date   HGBA1C 12.8* 05/24/2015   Urine Drug Screen: No results found for: LABOPIA, COCAINSCRNUR, LABBENZ, AMPHETMU, THCU, LABBARB    IMAGING I have personally reviewed the radiological images below and agree with the radiology interpretations.  CT  Head Wo Contrast 05/22/2015   1. Subacute nonhemorrhagic infarction high in the right parietal lobe with secondary edema of that portion of the brain without midline shift.  2. Multiple small scalp contusions.   Mr [facility]  Contrast 05/23/2015   Motion degraded. No emergent large vessel occlusion to correspond with the acute posterior right MCA infarct.   Mr Brain Wo Contrast 05/23/2015   1. Acute posterior right MCA territory infarct corresponding to the abnormal CT appearance yesterday. Cytotoxic edema without intracranial mass effect at this time.  2. Intracranial MRA from today reported separately.  3. Moderately to severely motion degraded and the examination had to be discontinued prior to completion due to patient agitation.  4. Consider surveillance for associated intracranial hemorrhage and mass effect with noncontrast CT.   US  Renal 05/23/2015   1. No hydronephrosis.  2. Echogenic normal size kidneys, indicating nonspecific renal parenchymal disease of uncertain chronicity.  3. Mild diffuse bladder wall thickening and trabeculation, suggesting chronic bladder voiding dysfunction such as due to chronic bladder outlet obstruction by an enlarged prostate gland. The prostate gland is not evaluated on this scan.   US Carotid Bilateral 05/23/2015   Right: Nearly occlusive centralized thrombus/plaque within the right common carotid artery which contributes to post stenotic and decreased antegrade flow of the right ICA. This plaque is favored to represent acute embolism/thrombus given the morphology. Formal cerebral angiogram is recommended. As a second best test, CTA. Left: The patient is status post left carotid endarterectomy in September 2011, with atherosclerotic changes of the common carotid artery. Established duplex criteria have not been validated for assessment after surgery, however, there is no evidence of compromised flow. High diastolic component of the flow on the left suggests increase contribution, potentially to the right hemisphere given the small infarction on recent CT.   EEG - This is an abnormal EEG demonstrating posterior slowing on the right compared to that of the left, consistent with the patient's history of a new right PCA infarct. There was no epileptiform activity recorded on this recording.  2D echo - Upper normal LV chamber size with mild LVH and LVEF approximately  35%. There is diffuse hypokinesis with akinesis of the basal  inferolateral/inferior wall. Grade 1 diastolic dysfunction with  indeterminate filling pressure. Upper normal left atrial chamber  size. Trivial mitral regurgitation. No obvious PFO or ASD.   PHYSICAL EXAM  Temp:  [97.5 F (36.4 C)-99 F (37.2 C)] 98.6 F (37 C) (03/18 1200) Pulse Rate:  [62-83] 70 (03/18 1200) Resp:  [0-26] 26 (03/18 1200) BP: (94-167)/(47-86)  138/72 mmHg (03/18 1200) SpO2:  [93 %-99 %] 96 % (03/18 1200)  General - cachetic looking, well developed, in no apparent distress.  Ophthalmologic - Fundi not visualized due to noncooperation.  Cardiovascular - Regular rate and rhythm.  Mental Status -  Level of arousal and orientation to time, place, and person were intact. Language including expression, naming, repetition, comprehension was assessed and found intact. Fund of Knowledge was assessed and was intact.  Cranial Nerves II - XII - II - inconsistent exam, questionable left hemianopia. Does not blink to threat bilaterally and poor effort on exam when testing EOM. But lateral gaze bilaterally  intact when I move to each side and ask him questions on the colors of my clothes which he answers correctly.  III, IV, VI - difficulty with bilateral lateral gaze, eyes mid position. Does not blink to threat and poor effort on exam when testing EOM. But lateral gaze intact when I move to each side and ask him questions on the colors of my clothes which he answers correctly.  V - Facial sensation intact bilaterally. VII - Facial movement intact bilaterally. VIII - Hearing &  vestibular intact bilaterally. X - Palate elevates symmetrically. XI - Chin turning & shoulder shrug intact bilaterally. XII - Tongue protrusion intact. Motor Strength - The patient's strength was 0/5 LUE and left leg stump, 5/5 RUE and right leg stump, bilateral AKA.  Bulk was normal and fasciculations were absent.   Motor Tone - Muscle tone was assessed at the neck and appendages and was normal.  Reflexes - The patient's reflexes were 1+ in UEs  Sensory - Light touch, temperature/pinprick were assessed and were symmetrical.    Coordination - The patient had normal movements in the right hand with no ataxia or dysmetria.  Tremor was absent.  Gait and Station - not able to test.   ASSESSMENT/PLAN Mr. MAYNOR MWANGI is a 60 y.o. male with history of DM, seizures,  htn, previous stroke, hld, and carotid stenosis presenting with acute onset of left-sided weakness. He did not receive IV t-PA due to late presentation.  Stroke:  Non-dominant infarct probably embolic from right carotid artery stenosis.  Resultant  Left hemiplegia  MRI  Acute posterior right MCA territory infarct with cytotoxic edema  MRA  No emergent large vessel occlusion to correspond with the acute posterior right MCA infarct.   Carotid Doppler - near occlusion of right carotid artery.   2D Echo - EF 35%. No PFO. No cardiac source of emboli identified.  LDL - 180  HgbA1c 12.6  VTE prophylaxis - subcutaneous heparin Diet Carb Modified Fluid consistency:: Thin; Room service appropriate?: Yes  clopidogrel 75 mg daily prior to admission, now on aspirin 325 mg daily and clopidogrel 75 mg daily. Continue DAPT.   Patient counseled to be compliant with his antithrombotic medications  Ongoing aggressive stroke risk factor management  Therapy recommendations: Pending  Disposition: Pending  Right ICA near occlusion  Cause of right MCA infarct  Low BP on dopamine  TTE EF 35%  On IVF, BP goal 130-150  Vascular Surgery consult - 05/25/15 - CTA of Neck recommended when safe. ( Creat - 1.61 GFR - 46 ). Would not be a surgical candidate right away.  Appreciate Dr Adele Dan consult.  Hypotension   On IVF  Off dopamine  May consider midodrine and florinef if still low BP  EF 35%  CCM on board  Cardiomyopathy   EF 35%  Low BP  Appreciate cardiology recommendations:  Systolic CHF: LVEF 35%. Very poor candidate for invasive cardiology procedures. Has CKD. Creatinine mildly improved today to 1.6. Not able to add ACE-I/ARB due to this. On aspirin and plavix. Start low dose coreg 3.125 mg BID as HR has improved up to the 70-80 range.  ? Seizure  Hx of seizure on keppra   Continue keppra 1000mg  bid  Reported event of left arm shaking over night. Received ativan  EEG  showed right slowing, no seizure  Hyperlipidemia  Home meds:  Lipitor 80 mg daily resumed in hospital  LDL 180, goal < 70  Consider adding Zetia.  Continue statin at discharge  Diabetes  HgbA1c 12.6, goal < 7.0  Uncontrolled  Noncompliance  Tobacco abuse  Current smoker  Smoking cessation counseling provided  Pt is NOT willing to quit  Other Stroke Risk Factors  Hx stroke/TIA  S/p left CEA  Other Active Problems  Elevated creatinine 1.86 -> 1.61  Hyperkalemia 5.2 - repeat in Saturday -> 3.7  Urine culture pending  Blood cultures pending, prelim negative  B/l AKA   Hospital day # 3  This patient is critically ill due  to right ICA near occlusion, low BP, cardiomyopathy, seizure and at significant risk of neurological worsening, death form recurrent stroke, hemorrhagic transformation, seizure, heart failure. This patient's care requires constant monitoring of vital signs, hemodynamics, respiratory and cardiac monitoring, review of multiple databases, neurological assessment, discussion with family, other specialists and medical decision making of high complexity. I spent 40 minutes of neurocritical care time in the care of this patient.  Personally examined patient and images, and have participated in and made any corrections needed to history, physical, neuro exam,assessment and plan as stated above.  I have personally obtained the history, evaluated lab date, reviewed imaging studies and agree with radiology interpretations.    Naomie DeanAntonia Ahern, MD Neurology [phone] [facility]        To contact Stroke Continuity provider, please refer to [email or web address]. After hours, contact General Neurology

## 2015-05-25 NOTE — Consult Note (Signed)
Vascular and Vein Specialist of [facility]  Patient name: Ian Garcia MRN: [id number] DOB: 09-11-1955 Sex: male  REASON FOR CONSULT: Right carotid stenosis. Consult is from Dr. Roda Shutters.  HPI: Ian Garcia is a 60 y.o. male,  was admitted on 05/22/2015. He has bilateral above-the-knee amputations. He states that he developed sudden onset of left-sided weakness while in the shower and fell. He was brought to the emergency department and underwent a stroke workup. He was found to have a significant right carotid stenosis and therefore vascular surgery was consult. Of note, he has undergone previous left carotid endarterectomy at the [facility] in [facility]. He denies any previous history of stroke, TIAs, expressive or receptive aphasia, or amaurosis fugax. He tells me that he had not been on aspirin for the last year.   The patient has a history of uncontrolled diabetes complicated by diabetic neuropathy and peripheral vascular disease. He's had bilateral above-the-knee amputations and is wheelchair bound.   He is a smoker.  Appendectomy has been evaluated by cardiology and has chronic systolic congestive heart failure. He is a very poor candidate for any invasive cardiology procedures as noted by Dr. Elease Hashimoto.   Past Medical History  Diagnosis Date  . Diabetes mellitus (HCC)   . Diabetic neuropathy (HCC)   . Seizures (HCC)   . Hypertension   . Acid reflux   . Hypercholesteremia   . Peripheral vascular disease due to secondary diabetes mellitus (HCC)   . CVA (cerebral infarction)   . Carotid stenosis     s/p CEA on the left  . Glaucoma     No family history on file.  FAMILY HISTORY: He denies any family history of premature cardiovascular disease. His father had a heart attack at age 52.  SOCIAL HISTORY: He smokes 1 pack per day of cigarettes and has been smoking for as long as he can remember. Social History   Social History  . Marital Status: Divorced    Spouse Name: N/A    . Number of Children: N/A  . Years of Education: N/A   Occupational History  . Not on file.   Social History Main Topics  . Smoking status: Current Every Day Smoker  . Smokeless tobacco: Not on file  . Alcohol Use: No  . Drug Use: No  . Sexual Activity: Yes    Birth Control/ Protection: None   Other Topics Concern  . Not on file   Social History Narrative    Allergies  Allergen Reactions  . Flagyl [Metronidazole] Itching and Rash  . Penicillins Itching and Rash    Current Facility-Administered Medications  Medication Dose Route Frequency Provider Last Rate Last Dose  .  stroke: mapping our early stages of recovery book   Does not apply Once Nishant Dhungel, MD      . 0.9 %  sodium chloride infusion   Intravenous Continuous Marvel Plan, MD 100 mL/hr at 05/25/15 0800    . acetaminophen (TYLENOL) tablet 325 mg  325 mg Oral BID Nishant Dhungel, MD   325 mg at 05/25/15 0919  . amitriptyline (ELAVIL) tablet 25 mg  25 mg Oral QHS Nishant Dhungel, MD   25 mg at 05/24/15 2156  . aspirin tablet 325 mg  325 mg Oral Daily Rodolph Bong, MD   325 mg at 05/25/15 0919  . atorvastatin (LIPITOR) tablet 80 mg  80 mg Oral q1800 Henderson Cloud, MD   80 mg at 05/24/15 1850  . cholecalciferol (  VITAMIN D) tablet 800 Units  800 Units Oral Daily Nishant Dhungel, MD   800 Units at 05/25/15 0920  . clopidogrel (PLAVIX) tablet 75 mg  75 mg Oral Daily Rodolph Bong, MD   75 mg at 05/25/15 0919  . DOPamine (INTROPIN) 800 mg in dextrose 5 % 250 mL (3.2 mg/mL) infusion  0-20 mcg/kg/min Intravenous Continuous Lupita Leash, MD   Stopped at 05/24/15 1500  . gabapentin (NEURONTIN) capsule 900 mg  900 mg Oral BID Nishant Dhungel, MD   900 mg at 05/25/15 0919  . heparin injection 5,000 Units  5,000 Units Subcutaneous 3 times per day Lisette Grinder, MD   5,000 Units at 05/25/15 0518  . insulin aspart (novoLOG) injection 0-15 Units  0-15 Units Subcutaneous 6 times per day Rodolph Bong, MD   2 Units at 05/25/15 0000  . levETIRAcetam (KEPPRA) tablet 1,000 mg  1,000 mg Oral BID Heron Sabins, MD   1,000 mg at 05/25/15 0919  . magnesium oxide (MAG-OX) tablet 800 mg  800 mg Oral Daily Nishant Dhungel, MD   800 mg at 05/25/15 0920  . pantoprazole (PROTONIX) EC tablet 40 mg  40 mg Oral Daily Nishant Dhungel, MD   40 mg at 05/25/15 0919  . senna-docusate (Senokot-S) tablet 1 tablet  1 tablet Oral QHS PRN Nishant Dhungel, MD        REVIEW OF SYSTEMS:   denotes positive finding,  denotes negative finding Cardiac  Comments:  Chest pain or chest pressure: X This appears to be musculoskeletal pain related to his fall in the shower.  Shortness of breath upon exertion: X   Short of breath when lying flat: X   Irregular heart rhythm:        Vascular    Pain in calf, thigh, or hip brought on by ambulation:    Pain in feet at night that wakes you up from your sleep:     Blood clot in your veins:    Leg swelling:         Pulmonary    Oxygen at home:    Productive cough:     Wheezing:         Neurologic    Sudden weakness in arms or legs:     Sudden numbness in arms or legs:     Sudden onset of difficulty speaking or slurred speech:    Temporary loss of vision in one eye:     Problems with dizziness:         Gastrointestinal    Blood in stool:     Vomited blood:         Genitourinary    Burning when urinating:     Blood in urine:        Psychiatric    Major depression:         Hematologic    Bleeding problems:    Problems with blood clotting too easily:        Skin    Rashes or ulcers:        Constitutional    Fever or chills:      PHYSICAL EXAM: Filed Vitals:   05/25/15 0500 05/25/15 0600 05/25/15 0700 05/25/15 0800  BP: 108/57 118/67 118/64 133/69  Pulse: 71 75 83 78  Temp:    98.1 F (36.7 C)  TempSrc:    Oral  Resp: 0 0 13 0  Height:      Weight:  SpO2: 97% 98% 98% 98%    GENERAL: The patient is a well-nourished male, in no  acute distress. The vital signs are documented above. CARDIAC: There is a regular rate and rhythm.  VASCULAR: Appendectomy has a right carotid bruit. Palpable femoral pulses. PULMONARY: There is good air exchange bilaterally without wheezing or rales. ABDOMEN: Soft and non-tender with normal pitched bowel sounds.  MUSCULOSKELETAL: He has bilateral above-the-knee amputations. NEUROLOGIC: He has profound left upper extremity weakness and paresthesias. He has good strength in the right upper extremity. SKIN: There are 2 very superficial ulcerations on the distal aspect of his right above-the-knee amputation. PSYCHIATRIC: The patient has a normal affect.  DATA:   CAROTID DUPLEX: Carotid duplex scan on 05/23/2015 shows nearly occlusive Plaque versus thrombus of the right common carotid artery with decreased antegrade flow in the right internal carotid artery. The patient has had previous left carotid endarterectomy and this is widely patent. This carotid duplex scan was done at [facility] and I cannot review these images.   MRI BRAIN: MRI of the brain done at the referring hospital did show a posterior parietal acute right middle cerebral artery infarct.  CT HEAD: CT of the head on 05/22/2015 shows subacute nonhemorrhagic infarct in the right parietal lobe with secondary edema.  CARDIAC ECHO:  The patient has mild LVH with a left ventricular ejection fraction of 35%. There is diffuse hypokinesis with akinesis of the basal inferior lateral wall.  His creatinine is 1.6. His GFR is 46.  MEDICAL ISSUES:  RIGHT HEMISPHERIC STROKE WITH TIGHT RIGHT CAROTID STENOSIS: The patient has asymptomatic right carotid stenosis. The study was done at [facility] and I am unable to review the images. However they note the disease is in the common carotid artery and that he has plaque versus thrombus in the common carotid artery. This plaque also at the carotid bifurcation. I would recommend a CT  angiogram of the neck to further assess his right carotid disease. The patient does have mild chronic renal insufficiency and I will defer to the primary service as to if this would be safe to do and the timing associated with this. I would agree that given his profound left upper extremity weakness with secondary edema noted on his CT scan of the head, I do not think he is a good candidate for carotid endarterectomy within the next week or so. He is currently on aspirin, Plavix, and a statin. I agree with continuing maximal medical therapy at this point and I can make further recommendations if or when it is safe to obtain a CT angiogram. In the meantime, I will try to see if radiology can retrieve the images from the duplex done at [facility].   Waverly Ferrariickson, Shyloh Derosa [facility] [place]: [phone]

## 2015-05-26 LAB — GLUCOSE, CAPILLARY
GLUCOSE-CAPILLARY: 176 mg/dL — AB (ref 65–99)
GLUCOSE-CAPILLARY: 175 mg/dL — AB (ref 65–99)
GLUCOSE-CAPILLARY: 119 mg/dL — AB (ref 65–99)
GLUCOSE-CAPILLARY: 201 mg/dL — AB (ref 65–99)
GLUCOSE-CAPILLARY: 279 mg/dL — AB (ref 65–99)

## 2015-05-26 LAB — URINE CULTURE: Culture: 10000

## 2015-05-26 NOTE — Progress Notes (Signed)
Patient arrived to unit alert and oriented. Bilateral BKA. Buttock red, blanchable. C/O headache and tylenol given. Oriented to room and plan of care.

## 2015-05-26 NOTE — Evaluation (Signed)
Occupational Therapy Evaluation Patient Details Name: Ian Garcia MRN: [id number] DOB: 1956-01-02 Today's Date: 05/26/2015    History of Present Illness Patient is a 60 yo male admitted 05/22/15 with Lt sided weakness and decreased sensation.  MRI showed acute Rt MCA infarct.  Patient also with thrombus in Rt ICA.    PMH:  DM, neuropathy, PVD, bilateral AKA's, HTN, seizure disorder, CVA, carotid stenosis, tobacco use, CHF with EF 35%   Clinical Impression   This 60 yo male admitted with above presents to acute OT with deficits below affecting his ability to care for himself as he was pta. He will benefit from acute OT with follow up OT at SNF to get back to independent as possible.    Follow Up Recommendations  SNF    Equipment Recommendations   (TBD at SNF)       Precautions / Restrictions Precautions Precautions: Fall Restrictions Weight Bearing Restrictions: No      Mobility Bed Mobility Overal bed mobility: Needs Assistance;+2 for physical assistance Bed Mobility: Supine to Sit;Sit to Supine     Supine to sit: +2 for physical assistance;Total assist Sit to supine: +2 for physical assistance;Total assist      Transfers Overall transfer level: Needs assistance               General transfer comment: bed>recliner    Balance Overall balance assessment: Needs assistance Sitting-balance support: Single extremity supported;Feet unsupported (Bil AKAs) Sitting balance-Leahy Scale: Zero Sitting balance - Comments: Pushing with RUE, causing Lt lean.  With relaxed RUE, patient continued to lean to posteriorly and to right. Required +2 total assist througihout 5 minutes sitting EOB to maintain balance--even wtih having pt reach forward with LUE                                    ADL Overall ADL's : Needs assistance/impaired Eating/Feeding: Set up (intermittent S due to visual loss on left, supported sitting)   Grooming: Moderate assistance  (supported sitting)   Upper Body Bathing: Moderate assistance (supported sitting)   Lower Body Bathing: Total assistance;Bed level   Upper Body Dressing : Total assistance;Bed level   Lower Body Dressing: Total assistance;Bed level                       Vision Additional Comments: Pt with difficulty following commands for testing. functionally noted pt having issues to his left from left eye (finding items on his food tray)          Pertinent Vitals/Pain Pain Assessment: No/denies pain     Hand Dominance Right   Extremity/Trunk Assessment Upper Extremity Assessment Upper Extremity Assessment: LUE deficits/detail LUE Sensation:  (absent sensation) LUE Coordination: decreased fine motor;decreased gross motor           Communication Communication Communication: No difficulties   Cognition Arousal/Alertness: Awake/alert Behavior During Therapy: WFL for tasks assessed/performed Overall Cognitive Status: Impaired/Different from baseline Area of Impairment: Safety/judgement;Problem solving         Safety/Judgement: Decreased awareness of deficits;Decreased awareness of safety   Problem Solving: Slow processing;Decreased initiation;Difficulty sequencing;Requires verbal cues;Requires tactile cues General Comments: Pt states right before admission he was ambulating but then he says he only has one prothesis.              Home Living Family/patient expects to be discharged to:: Skilled nursing facility  Home Equipment: Wheelchair - Copymanual;Tub bench (transfer board)      Lives With: Alone    Prior Functioning/Environment Level of Independence: Independent with assistive device(s)        Comments: Wheelchair bound. Able to transfer on his own.  Independent with ADL's    OT Diagnosis: Generalized weakness;Cognitive deficits;Disturbance of vision;Hemiplegia non-dominant side   OT Problem List: Decreased  strength;Decreased range of motion;Impaired balance (sitting and/or standing);Impaired sensation;Impaired UE functional use;Decreased cognition;Decreased coordination;Obesity;Decreased knowledge of use of DME or AE;Impaired tone   OT Treatment/Interventions: Self-care/ADL training;Patient/family education;Balance training;Therapeutic activities;DME and/or AE instruction;Cognitive remediation/compensation;Therapeutic exercise    OT Goals(Current goals can be found in the care plan section) Acute Rehab OT Goals Patient Stated Goal: to get up in recliner OT Goal Formulation: With patient Time For Goal Achievement: 06/09/15 Potential to Achieve Goals: Good  OT Frequency: Min 2X/week   Barriers to D/C: Decreased caregiver support             End of Session Nurse Communication: Mobility status;Need for lift equipment (concern of him squirming out of chair if he is a squirmer--nurse said he is usually not)  Activity Tolerance: Patient tolerated treatment well Patient left: in chair;with call bell/phone within reach   Time: 1310-1350 OT Time Calculation (min): 40 min Charges:  OT General Charges $OT Visit: 1 Procedure OT Evaluation $OT Eval Moderate Complexity: 1 Procedure OT Treatments $Therapeutic Activity: 23-37 mins  Evette GeorgesLeonard, Rayon Mcchristian Eva [phone] 05/26/2015, 2:17 PM

## 2015-05-26 NOTE — Progress Notes (Signed)
STROKE TEAM PROGRESS NOTE   HISTORY OF PRESENT ILLNESS Ian Garcia is an 60 y.o. 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  He was admitted to the hospitalist service at [facility]. As part of the stroke workup he had ultrasound of the carotids done which showed a near occlusive thrombus in the right ICA. MRI of the brain was performed which was of poor quality due to motion artifacts. It Did show a posterior parietal acute right MCA infarct.  Due to the abnormality seen on the carotid ultrasound with a near occlusive acute thrombus, he was transferred to [facility] for further evaluation and management options including no any further interventions. His been transferred to the ICU. Patient is a poor historian. Reports living alone. No family available at bedside.  Date last known well: 05/21/2015 Time last known well: Unknown tPA Given: No: Outside time window   SUBJECTIVE (INTERVAL HISTORY) His RN is at the bedside.  Overall he feels his condition is stable. Patient lives alone and is noncompliant. Pending PT recommendations and also could use social work consult. He had right near occlusion ICA, s/p left CEA and b/l AKA. Still smoking and not willing to quit. Uncontrolled diabetes.  Appreciate vascular and cardiology recommendations, started coreg low dose for systolic CHF. On dual anti-platelet for severe atherosclerosis. Patient non compliant. Will transfer to floor and ask PT and social work to see patient.    OBJECTIVE Temp:   [97.6 F (36.4 C)-99 F (37.2 C)] 97.9 F (36.6 C) (03/19 0400) Pulse Rate:  [38-89] 77 (03/19 0600) Cardiac Rhythm:  [-] Normal sinus rhythm (03/18 2000) Resp:  [0-27] 15 (03/19 0600) BP: (110-167)/(46-86) 133/65 mmHg (03/19 0600) SpO2:  [91 %-99 %] 98 % (03/19 0600)  CBC:   Recent Labs Lab 05/22/15 1943 05/23/15 0917 05/24/15 0515  WBC 10.6* 8.3 8.3  NEUTROABS 7.6  --   --   HGB 15.1 13.4 13.5  HCT 45.9 41.8 41.6  MCV 81.8 83.1 85.1  PLT 330 278 242    Basic Metabolic Panel:   Recent Labs Lab 05/24/15 0515 05/24/15 0927 05/25/15 0343  NA 142  --  142  K 5.2*  --  3.7  CL 109  --  111  CO2 17*  --  22  GLUCOSE 154*  --  105*  BUN 17  --  12  CREATININE 1.86*  --  1.61*  CALCIUM 7.7*  --  7.7*  MG  --  1.8  --   PHOS  --  2.6  --     Lipid Panel:     Component Value Date/Time   CHOL 264* 05/24/2015 0357   TRIG 271* 05/24/2015 0357   HDL 30* 05/24/2015 0357   CHOLHDL 8.8 05/24/2015 0357   VLDL 54* 05/24/2015 0357   LDLCALC 180* 05/24/2015 0357   HgbA1c:  Lab Results  Component Value Date   HGBA1C 12.8* 05/24/2015   Urine Drug Screen: No results found for: LABOPIA, COCAINSCRNUR, LABBENZ, AMPHETMU, THCU, LABBARB    IMAGING I have personally reviewed the radiological images below and agree with the radiology interpretations.  CT  Head Wo Contrast 05/22/2015  1. Subacute nonhemorrhagic infarction high in the right parietal lobe with secondary edema of that portion of the brain without midline shift.  2. Multiple small scalp contusions.   [name] Head Wo Contrast 05/23/2015   Motion degraded. No emergent large vessel occlusion to correspond with the acute posterior right MCA infarct.   Mr Brain Wo Contrast 05/23/2015   1. Acute posterior right MCA territory infarct corresponding to the abnormal CT appearance yesterday. Cytotoxic edema without intracranial mass effect at this time.  2. Intracranial MRA from today reported separately.  3. Moderately  to severely motion degraded and the examination had to be discontinued prior to completion due to patient agitation.  4. Consider surveillance for associated intracranial hemorrhage and mass effect with noncontrast CT.   US Renal 05/23/2015   1. No hydronephrosis.  2. Echogenic normal size kidneys, indicating nonspecific renal parenchymal disease of uncertain chronicity.  3. Mild diffuse bladder wall thickening and trabeculation, suggesting chronic bladder voiding dysfunction such as due to chronic bladder outlet obstruction by an enlarged prostate gland. The prostate gland is not evaluated on this scan.   US Carotid Bilateral 05/23/2015   Right: Nearly occlusive centralized thrombus/plaque within the right common carotid artery which contributes to post stenotic and decreased antegrade flow of the right ICA. This plaque is favored to represent acute embolism/thrombus given the morphology. Formal cerebral angiogram is recommended. As a second best test, CTA. Left: The patient is status post left carotid endarterectomy in September 2011, with atherosclerotic changes of the common carotid artery. Established duplex criteria have not been validated for assessment after surgery, however, there is no evidence of compromised flow. High diastolic component of the flow on the left suggests increase contribution, potentially to the right hemisphere given the small infarction on recent CT.   EEG - This is an abnormal EEG demonstrating posterior slowing on the right compared to that of the left, consistent with the patient's history of a new right PCA infarct. There was no epileptiform activity recorded on this recording.  2D echo - Upper normal LV chamber size with mild LVH and LVEF approximately  35%. There is diffuse hypokinesis with akinesis of the basal  inferolateral/inferior wall. Grade 1 diastolic dysfunction with  indeterminate filling pressure. Upper normal left atrial chamber  size. Trivial  mitral regurgitation. No obvious PFO or ASD.   PHYSICAL EXAM  Temp:  [97.6 F (36.4 C)-99 F (37.2 C)] 97.9 F (36.6 C) (03/19 0400) Pulse Rate:  [38-89] 77 (03/19 0600) Resp:  [0-27] 15 (03/19 0600) BP: (110-167)/(46-86) 133/65 mmHg (03/19 0600) SpO2:  [91 %-99 %] 98 % (03/19 0600)  General - cachetic looking, well developed, in no apparent distress.  Ophthalmologic - Fundi not visualized due to noncooperation.  Cardiovascular - Regular rate and rhythm.  Mental Status -  Level of arousal and orientation to time, place, and person were intact. Language including expression, naming, repetition, comprehension was assessed and found intact. Fund of Knowledge was assessed and was intact.  Cranial Nerves II - XII - II - inconsistent exam, questionable left hemianopia. Does not blink to threat bilaterally and poor effort on exam when testing EOM. But lateral gaze bilaterally  intact when I move to each side and ask him questions on the colors of my clothes which he answers correctly.  III, IV, VI - difficulty with bilateral lateral gaze, eyes mid position. Does not blink to threat and poor effort on exam when testing EOM. But lateral gaze intact when I move  to each side and ask him questions on the colors of my clothes which he answers correctly.  V - Facial sensation intact bilaterally. VII - Facial movement intact bilaterally. VIII - Hearing & vestibular intact bilaterally. X - Palate elevates symmetrically. XI - Chin turning & shoulder shrug intact bilaterally. XII - Tongue protrusion intact. Motor Strength - The patient's strength was 0/5 LUE and left leg stump, 5/5 RUE and right leg stump, bilateral AKA.  Bulk was normal and fasciculations were absent.   Motor Tone - Muscle tone was assessed at the neck and appendages and was normal.  Reflexes - The patient's reflexes were 1+ in UEs  Sensory - Light touch, temperature/pinprick were assessed and were symmetrical.     Coordination - The patient had normal movements in the right hand with no ataxia or dysmetria.  Tremor was absent.  Gait and Station - not able to test.   ASSESSMENT/PLAN Mr. JOSHOA SHAWLER is a 60 y.o. male with history of DM, seizures, htn, previous stroke, hld, and carotid stenosis presenting with acute onset of left-sided weakness. He did not receive IV t-PA due to late presentation.  Stroke:  Non-dominant infarct probably embolic from right carotid artery stenosis.  Resultant  Left hemiplegia  MRI  Acute posterior right MCA territory infarct with cytotoxic edema  MRA  No emergent large vessel occlusion to correspond with the acute posterior right MCA infarct.   Carotid Doppler - near occlusion of right carotid artery.   2D Echo - EF 35%. No PFO. No cardiac source of emboli identified.  LDL - 180  HgbA1c 12.6  VTE prophylaxis - subcutaneous heparin Diet Carb Modified Fluid consistency:: Thin; Room service appropriate?: Yes  clopidogrel 75 mg daily prior to admission, now on aspirin 325 mg daily and clopidogrel 75 mg daily. Continue DAPT.   Patient counseled to be compliant with his antithrombotic medications  Ongoing aggressive stroke risk factor management  Therapy recommendations: SNF recommended  Disposition: Pending  Right ICA near occlusion  Cause of right MCA infarct  Low BP on dopamine  TTE EF 35%  On IVF, BP goal 130-150  Vascular Surgery consult - 05/25/15 - CTA of Neck recommended when safe. ( Creat - 1.61 GFR - 46 ). Would not be a surgical candidate right away.  Appreciate Dr Adele Dan consult.  Hypotension   On IVF  Off dopamine  May consider midodrine and florinef if still low BP  EF 35%  CCM on board  Cardiomyopathy   EF 35%  Low BP  Appreciate cardiology recommendations:  Systolic CHF: LVEF 35%. Very poor candidate for invasive cardiology procedures. Has CKD. Creatinine mildly improved today to 1.6. Not able to add ACE-I/ARB  due to this. On aspirin and plavix. Start low dose coreg 3.125 mg BID as HR has improved up to the 70-80 range.  ? Seizure  Hx of seizure on keppra   Continue keppra  bid  Reported event of left arm shaking over night. Received ativan  EEG showed right slowing, no seizure  Hyperlipidemia  Home meds:  Lipitor 80 mg daily resumed in hospital  LDL 180, goal < 70  Consider adding Zetia.  Continue statin at discharge  Diabetes  HgbA1c 12.6, goal < 7.0  Uncontrolled  Noncompliance  Tobacco abuse  Current smoker  Smoking cessation counseling provided  Pt is NOT willing to quit  Other Stroke Risk Factors  Hx stroke/TIA  S/p left CEA  Other Active Problems  Elevated creatinine 1.86 -> 1.61  Hyperkalemia 5.2 - repeat in Saturday -> 3.7  Urine culture - 10,000 colonies ENTEROCOCCUS SPECIES  Blood cultures pending - no growth x 2 days  B/l AKA   Hospital day # [id number]  60 year old patient with right ICA near occlusion, low BP, cardiomyopathy, seizure and at significant risk of neurological worsening, death form recurrent stroke, hemorrhagic transformation, seizure, heart failure. This patient's care requires constant monitoring of vital signs, hemodynamics, respiratory and cardiac monitoring, review of multiple databases, neurological assessment, discussion with family, other specialists and medical decision making of high complexity. I spent 40 minutes of patient care time in the care of this patient.  Personally examined patient and images, and have participated in and made any corrections needed to history, physical, neuro exam,assessment and plan as stated above.  I have personally obtained the history, evaluated lab date, reviewed imaging studies and agree with radiology interpretations.    Naomie DeanAntonia Kathy Wares, MD Neurology [phone] [facility]        To contact Stroke Continuity provider, please refer to [email or web address]. After hours, contact  General Neurology

## 2015-05-26 NOTE — Progress Notes (Signed)
Report received from [place], transfer to [address].

## 2015-05-26 NOTE — Progress Notes (Signed)
PROGRESS NOTE  Subjective:    No events overnight. Being evaluated by vascular surgery for possible right CEA. Hypotension has resolved - off dopamine. Started on BB yesterday.  BP improved today.   Objective:    Vital Signs:   Temp:  [97.5 F (36.4 C)-99 F (37.2 C)] 97.5 F (36.4 C) (03/19 0800) Pulse Rate:  [38-89] 68 (03/19 0800) Resp:  [0-27] 13 (03/19 0800) BP: (110-167)/(46-86) 125/68 mmHg (03/19 0800) SpO2:  [91 %-99 %] 99 % (03/19 0800)      24-hour weight change: Weight change:   Weight trends: Filed Weights   05/22/15 1601 05/23/15 1545  Weight: 160 lb (72.576 kg) 164 lb 0.4 oz (74.4 kg)    Intake/Output:  03/18 0701 - 03/19 0700 In: 2300 [I.V.:2300] Out: 1200 [Urine:1200] Total I/O In: 200 [I.V.:200] Out: -    Physical Exam: BP 125/68 mmHg  Pulse 68  Temp(Src) 97.5 F (36.4 C) (Oral)  Resp 13  Ht 4\' 8"  (1.422 m)  Wt 164 lb 0.4 oz (74.4 kg)  BMI 36.79 kg/m2  SpO2 99%  Wt Readings from Last 3 Encounters:  05/23/15 164 lb 0.4 oz (74.4 kg)  09/15/13 184 lb (83.462 kg)  04/12/12 171 lb 4.8 oz (77.7 kg)    General: Vital signs reviewed and noted. somulent  Head: Normocephalic, atraumatic.  Eyes: conjunctivae/corneas clear.  EOM's intact.   Throat: normal  Neck:  normla   Lungs:    clear   Heart:  RR   Abdomen:  Soft, non-tender, non-distended    Extremities: S/p Bilat AKA   Neurologic: A&O X3, CN II - XII are grossly intact. , paralized onleft side  Psych: Normal     Labs: BMET:  Recent Labs  05/24/15 0515 05/24/15 0927 05/25/15 0343  NA 142  --  142  K 5.2*  --  3.7  CL 109  --  111  CO2 17*  --  22  GLUCOSE 154*  --  105*  BUN 17  --  12  CREATININE 1.86*  --  1.61*  CALCIUM 7.7*  --  7.7*  MG  --  1.8  --   PHOS  --  2.6  --     Liver function tests: No results for input(s): AST, ALT, ALKPHOS, BILITOT, PROT, ALBUMIN in the last 72 hours. No results for input(s): LIPASE, AMYLASE in the last 72  hours.  CBC:  Recent Labs  05/24/15 0515  WBC 8.3  HGB 13.5  HCT 41.6  MCV 85.1  PLT 242    Cardiac Enzymes:  Recent Labs  05/24/15 0200 05/24/15 0920  TROPONINI 0.05* 0.05*    Coagulation Studies: No results for input(s): LABPROT, INR in the last 72 hours.  Other: Invalid input(s): POCBNP No results for input(s): DDIMER in the last 72 hours.  Recent Labs  05/24/15 0515  HGBA1C 12.8*    Recent Labs  05/24/15 0357  CHOL 264*  HDL 30*  LDLCALC 180*  TRIG 271*  CHOLHDL 8.8   No results for input(s): TSH, T4TOTAL, T3FREE, THYROIDAB in the last 72 hours.  Invalid input(s): FREET3 No results for input(s): VITAMINB12, FOLATE, FERRITIN, TIBC, IRON, RETICCTPCT in the last 72 hours.   Other results:  NSR on telemetry  Medications:    Infusions: . sodium chloride 100 mL/hr at 05/26/15 0800    Scheduled Medications: .  stroke: mapping our early stages of recovery book   Does not apply Once  . acetaminophen  325  mg Oral BID  . amitriptyline  25 mg Oral QHS  . aspirin  325 mg Oral Daily  . atorvastatin  80 mg Oral q1800  . carvedilol  3.125 mg Oral BID WC  . cholecalciferol  800 Units Oral Daily  . clopidogrel  75 mg Oral Daily  . gabapentin  900 mg Oral BID  . heparin  5,000 Units Subcutaneous 3 times per day  . insulin aspart  0-15 Units Subcutaneous 6 times per day  . levETIRAcetam  1,000 mg Oral BID  . magnesium oxide  800 mg Oral Daily  . pantoprazole  40 mg Oral Daily    Assessment/ Plan:   Principal Problem:   Acute ischemic stroke [facility]) Active Problems:   Type II diabetes mellitus with complication, uncontrolled (HCC)   Peripheral vascular disease due to secondary diabetes mellitus (HCC)   Seizure disorder (HCC)   CVA (cerebral infarction)   Acute kidney injury (HCC)   Below knee amputation status (HCC)   Tobacco abuse   Cerebral infarction due to unspecified mechanism   S/P AKA (above knee amputation) bilateral (HCC)    Cardiomyopathy (HCC)   Hypotension   Arterial hypotension   Right middle cerebral artery stroke (HCC)  PLAN:  1. Systolic CHF:    LVEF 35%. Very poor candidate for invasive cardiology procedures. Has CKD. Creatinine mildly improved today to 1.6. Not able to add ACE-I/ARB due to this. On aspirin and plavix. Continue low dose coreg 3.125 mg BID.  2. Embolic stroke:  Likely from his critical carotid disease. Seen by vascular surgery, plan for possible CT and surgery more than 1 week from now. On ASA and Plavix.  3. Dyslipidemia - on lipitor 80 mg daily now.  Chrystie Nose, MD, [facility] Attending Cardiologist [facility]  05/26/2015, 10:27 AM

## 2015-05-26 NOTE — Progress Notes (Signed)
   VASCULAR SURGERY ASSESSMENT & PLAN:  * The patient has a symptomatic right carotid stenosis. The disease is in the common carotid artery and that he has plaque versus thrombus in the common carotid artery. This plaque also at the carotid bifurcation. I would recommend a CT angiogram of the neck to further assess his right carotid disease. The patient does have mild chronic renal insufficiency and I will defer to the primary service as to if this would be safe to do and the timing associated with this. I would agree that given his profound left upper extremity weakness with secondary edema noted on his CT scan of the head, I do not think he is a good candidate for carotid endarterectomy within the next week or so. He is currently on aspirin, Plavix, and a statin. I agree with continuing maximal medical therapy at this point and I can make further recommendations if or when it is safe to obtain a CT angiogram.  SUBJECTIVE: No complaints  PHYSICAL EXAM: Filed Vitals:   05/26/15 0300 05/26/15 0400 05/26/15 0500 05/26/15 0600  BP: 127/55 143/63 139/69 133/65  Pulse: 77 71 77 77  Temp:  97.9 F (36.6 C)    TempSrc:  Oral    Resp: 16 21 18 15   Height:      Weight:      SpO2: 98% 98% 94% 98%   L UE flaccid.   LABS: Lab Results  Component Value Date   WBC 8.3 05/24/2015   HGB 13.5 05/24/2015   HCT 41.6 05/24/2015   MCV 85.1 05/24/2015   PLT 242 05/24/2015   Lab Results  Component Value Date   CREATININE 1.61* 05/25/2015   Lab Results  Component Value Date   INR 1.02 11/19/2009   CBG (last 3)   Recent Labs  05/25/15 2004 05/25/15 2316 05/26/15 0402  GLUCAP 209* 250* 176*    Principal Problem:   Acute ischemic stroke (HCC) Active Problems:   Type II diabetes mellitus with complication, uncontrolled (HCC)   Peripheral vascular disease due to secondary diabetes mellitus (HCC)   Seizure disorder (HCC)   CVA (cerebral infarction)   Acute kidney injury (HCC)   Below knee  amputation status (HCC)   Tobacco abuse   Cerebral infarction due to unspecified mechanism   S/P AKA (above knee amputation) bilateral (HCC)   Cardiomyopathy (HCC)   Hypotension   Arterial hypotension   Right middle cerebral artery stroke [facility](HCC)    Cari Carawayhris Ieasha Boerema Beeper: [phone]: [phone] 05/26/2015

## 2015-05-27 DIAGNOSIS — I5021 Acute systolic (congestive) heart failure: Secondary | ICD-10-CM

## 2015-05-27 LAB — GLUCOSE, CAPILLARY
GLUCOSE-CAPILLARY: 141 mg/dL — AB (ref 65–99)
GLUCOSE-CAPILLARY: 244 mg/dL — AB (ref 65–99)
GLUCOSE-CAPILLARY: 259 mg/dL — AB (ref 65–99)
Glucose-Capillary: 218 mg/dL — ABNORMAL HIGH (ref 65–99)
Glucose-Capillary: 132 mg/dL — ABNORMAL HIGH (ref 65–99)
Glucose-Capillary: 158 mg/dL — ABNORMAL HIGH (ref 65–99)
Glucose-Capillary: 276 mg/dL — ABNORMAL HIGH (ref 65–99)

## 2015-05-27 LAB — BASIC METABOLIC PANEL
ANION GAP: 8 (ref 5–15)
BUN: 12 mg/dL (ref 6–20)
CALCIUM: 7.9 mg/dL — AB (ref 8.9–10.3)
CO2: 22 mmol/L (ref 22–32)
Chloride: 108 mmol/L (ref 101–111)
Creatinine, Ser: 1.72 mg/dL — ABNORMAL HIGH (ref 0.61–1.24)
GFR calc Af Amer: 49 mL/min — ABNORMAL LOW (ref >60)
GFR calc non Af Amer: 42 mL/min — ABNORMAL LOW (ref >60)
GLUCOSE: 272 mg/dL — AB (ref 65–99)
Potassium: 4.4 mmol/L (ref 3.5–5.1)
Sodium: 138 mmol/L (ref 135–145)

## 2015-05-27 LAB — MAGNESIUM: Magnesium: 1.6 mg/dL — ABNORMAL LOW (ref 1.7–2.4)

## 2015-05-27 MED ORDER — POTASSIUM CHLORIDE CRYS ER 20 MEQ PO TBCR
20.0000 meq | EXTENDED_RELEASE_TABLET | Freq: Once | ORAL | Status: AC
  Administered 2015-05-27: 20 meq via ORAL
  Filled 2015-05-27: qty 1

## 2015-05-27 MED ORDER — FUROSEMIDE 10 MG/ML IJ SOLN
20.0000 mg | Freq: Once | INTRAMUSCULAR | Status: AC
  Administered 2015-05-27: 20 mg via INTRAVENOUS
  Filled 2015-05-27: qty 2

## 2015-05-27 NOTE — Progress Notes (Signed)
Patient Name: Nuala AlphaBruce L Carillo Date of Encounter: 05/27/2015  Principal Problem:   Acute ischemic stroke [facility](HCC) Active Problems:   Type II diabetes mellitus with complication, uncontrolled (HCC)   Peripheral vascular disease due to secondary diabetes mellitus (HCC)   Seizure disorder (HCC)   CVA (cerebral infarction)   Acute kidney injury (HCC)   Below knee amputation status (HCC)   Tobacco abuse   Cerebral infarction due to unspecified mechanism   S/P AKA (above knee amputation) bilateral (HCC)   Cardiomyopathy (HCC)   Hypotension   Arterial hypotension   Right middle cerebral artery stroke [facility](HCC)   Primary Cardiologist: Dr Rennis GoldenHilty  Patient Profile: 60 year old male with history of DM, diabetic neuropathy, seizures, HTN, GERD, PAD, CVA, carotid stenosis w/ L-CEA, HLD, ongoing tobacco. No prior cardiac history.Admitted 03/15 w/ CVA, EF 35% by echo, +WMA. No cath 2nd poor renal function, acute CVA, and PAD w/ bilateral AKAs. Needs R-CEA but not for a week or so, per [facility]  SUBJECTIVE: Says breathing well, but resp rate increases with conversation or any movement  OBJECTIVE Filed Vitals:   05/26/15 2053 05/27/15 0030 05/27/15 0331 05/27/15 0802  BP: 140/82 141/67 147/78 166/84  Pulse: 72 81 75 85  Temp: 97.9 F (36.6 C) 97.8 F (36.6 C) 98.1 F (36.7 C) 99 F (37.2 C)  TempSrc: Oral Oral Oral Oral  Resp: 18 18 20 18   Height:      Weight:      SpO2: 95% 92% 93% 95%    Intake/Output Summary (Last 24 hours) at 05/27/15 1312 Last data filed at 05/27/15 1100  Gross per 24 hour  Intake 1855.42 ml  Output      0 ml  Net 1855.42 ml   Filed Weights   05/22/15 1601 05/23/15 1545  Weight: 160 lb (72.576 kg) 164 lb 0.4 oz (74.4 kg)    PHYSICAL EXAM General: Well developed, well nourished, male in no acute distress. Head: Normocephalic, atraumatic.  Neck: Supple without bruits, JVD seems elevated, not well seen. Lungs:  Resp regular and unlabored, bibasilar  rales. Heart: RRR, S1, S2, no S3, S4, or murmur; no rub. Abdomen: Soft, non-tender, non-distended, BS + x 4.  Extremities: No clubbing, cyanosis, mild RUE edema. S/p bilateral AKA Neuro: Alert and oriented X 3. Moves all extremities spontaneously.  LABS: Basic Metabolic Panel: Recent Labs  05/25/15 0343  NA 142  K 3.7  CL 111  CO2 22  GLUCOSE 105*  BUN 12  CREATININE 1.61*  CALCIUM 7.7*   TELE:  SR, ST w/ PVCs and occasional pairs  Radiology/Studies: Dg Chest 2 View 05/22/2015  CLINICAL DATA:  De HollingsheadFallen several times since Thursday, BILATERAL lower extremity amputee, diabetes mellitus, peripheral vascular disease, smoker EXAM: CHEST  2 VIEW COMPARISON:  09/18/2006 FINDINGS: Upper normal heart size. Mediastinal contours and pulmonary vascularity normal. Lungs clear. No pleural effusion or pneumothorax. Bones unremarkable. IMPRESSION: No acute abnormalities. Electronically Signed   By: Ulyses SouthwardMark  Boles M.D.   On: 05/22/2015 17:37    Current Medications:  .  stroke: mapping our early stages of recovery book   Does not apply Once  . amitriptyline  25 mg Oral QHS  . aspirin  325 mg Oral Daily  . atorvastatin  80 mg Oral q1800  . carvedilol  3.125 mg Oral BID WC  . cholecalciferol  800 Units Oral Daily  . clopidogrel  75 mg Oral Daily  . gabapentin  900 mg Oral BID  . heparin  5,000  Units Subcutaneous 3 times per day  . insulin aspart  0-15 Units Subcutaneous 6 times per day  . levETIRAcetam  1,000 mg Oral BID  . magnesium oxide  800 mg Oral Daily  . pantoprazole  40 mg Oral Daily   . sodium chloride 75 mL/hr at 05/27/15 0804    ASSESSMENT AND PLAN: 1. Acute Systolic CHF: LVEF 35%. Very poor candidate for invasive cardiology procedures with his multiple medical problems   On aspirin and plavix. Continue low dose coreg 3.125 mg BID Cannot use ACE or ARB   I/O + by 4.8 L, BP increasing, need daily weights. Will give Lasix 20 mg IV x 1  Follow response  Decrease IVF to [facility]. PO intake  is good.   2. Embolic stroke: Likely from his critical carotid disease. Seen by vascular surgery, plan for possible surgery when improved  . On ASA and Plavix.  3. Dyslipidemia - on lipitor 80 mg daily now.   Otherwise, per IM  Principal Problem:   Acute ischemic stroke [facility]) Active Problems:   Type II diabetes mellitus with complication, uncontrolled (HCC)   Peripheral vascular disease due to secondary diabetes mellitus (HCC)   Seizure disorder (HCC)   CVA (cerebral infarction)   Acute kidney injury (HCC)   Below knee amputation status (HCC)   Tobacco abuse   Cerebral infarction due to unspecified mechanism   S/P AKA (above knee amputation) bilateral (HCC)   Cardiomyopathy (HCC)   Hypotension   Arterial hypotension   Right middle cerebral artery stroke (HCC)   Signed, Barrett, Rhonda , PA-C 1:12 PM 05/27/2015  Patient seen and examined  I have amended note above to reflect my findings Lungs are rel clear  Cardiac exam with RRR  No S3  Ext UE edema  S/p bilateral AKA Agree with diuresis with close follow up of i/Os. Will continue to follow.    Dietrich Pates

## 2015-05-27 NOTE — [place] FL2 (Signed)
[place] MEDICAID FL2 LEVEL OF CARE SCREENING TOOL     IDENTIFICATION  Patient Name: Ian AlphaBruce L Stave Birthdate: 12/21/1955 Sex: male Admission Date (Current Location): 05/22/2015  [facility] [place]   Facility and Address:  [facility] [place], [address], [place], [place] [address]      Provider Number: [id number]  Attending Physician Name and Address:  Marvel PlanJindong Xu, MD  Relative Name and Phone Number:  [name], sister, [phone]    Current Level of Care: Hospital Recommended Level of Care: Skilled Nursing Facility Prior Approval Number:    Date Approved/Denied:   PASRR Number: [id number]  Discharge Plan: SNF    Current Diagnoses: Patient Active Problem List   Diagnosis Date Noted  . Acute systolic CHF (congestive heart failure), NYHA class 3 (HCC) 05/27/2015  . 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 Acute kidney failure (HCC)   . Cerebral infarction due to unspecified mechanism   . S/P AKA (above knee amputation) bilateral (HCC)   . Cardiomyopathy (HCC)   . Hypotension   . Arterial hypotension   . Acute ischemic stroke (HCC) 05/22/2015  . CVA (cerebral infarction) 05/22/2015  . Acute kidney injury (HCC) 05/22/2015  . Below knee amputation status (HCC) 05/22/2015  . Tobacco abuse 05/22/2015  . Abnormality of gait 06/08/2013  . Difficulty in walking(719.7) 02/09/2013  . Gas gangrene of lower extremity (HCC) 04/11/2012  . Cellulitis and abscess of foot 04/11/2012  . Osteomyelitis of metatarsal (HCC) 04/11/2012  . Type II diabetes mellitus with complication, uncontrolled (HCC) 04/11/2012  . Hyponatremia 04/11/2012  . Anemia 04/11/2012  . Peripheral vascular disease due to secondary diabetes mellitus (HCC) 04/11/2012  . Seizure disorder (HCC) 04/11/2012    Orientation RESPIRATION BLADDER Height & Weight     Self, Place  Normal Indwelling catheter, Incontinent (urinary catheter) Weight: 81.647 kg (180  lb) Height:  4\' 8"  (142.2 cm)  BEHAVIORAL SYMPTOMS/MOOD NEUROLOGICAL BOWEL NUTRITION STATUS      Continent  (Please see DC summary)  AMBULATORY STATUS COMMUNICATION OF NEEDS Skin   Extensive Assist Verbally Normal                       Personal Care Assistance Level of Assistance  Bathing, Feeding, Dressing Bathing Assistance: Maximum assistance Feeding assistance: Limited assistance Dressing Assistance: Limited assistance     Functional Limitations Info             SPECIAL CARE FACTORS FREQUENCY  PT (By licensed PT)     PT Frequency: min 3x/week              Contractures      Additional Factors Info  Code Status, Allergies, Insulin Sliding Scale Code Status Info: Full Allergies Info: Flagyl, Penicillins   Insulin Sliding Scale Info: insulin aspart (novoLOG) injection 0-15 Units       Current Medications (05/27/2015):  This is the current hospital active medication list Current Facility-Administered Medications  Medication Dose Route Frequency Provider Last Rate Last Dose  .  stroke: mapping our early stages of recovery book   Does not apply Once Nishant Dhungel, MD      . 0.9 %  sodium chloride infusion   Intravenous Continuous Micki RileyPramod S Sethi, MD 10 mL/hr at 05/27/15 1558    . acetaminophen (TYLENOL) tablet 650 mg  650 mg Oral Q6H PRN Heron SabinsJose Vega, MD   650 mg at 05/27/15 1130  . amitriptyline (ELAVIL) tablet 25 mg  25 mg Oral QHS Nishant Dhungel, MD   25 mg at 05/26/15 2139  . aspirin tablet 325 mg  325 mg Oral Daily Rodolph Bong, MD   325 mg at 05/27/15 1128  . atorvastatin (LIPITOR) tablet 80 mg  80 mg Oral q1800 Henderson Cloud, MD   80 mg at 05/26/15 1736  . carvedilol (COREG) tablet 3.125 mg  3.125 mg Oral BID WC Chrystie Nose, MD   3.125 mg at 05/27/15 0803  . cholecalciferol (VITAMIN D) tablet 800 Units  800 Units Oral Daily Nishant Dhungel, MD   800 Units at 05/27/15 1000  . clopidogrel (PLAVIX) tablet 75 mg  75 mg Oral Daily Rodolph Bong, MD   75 mg at 05/27/15 1128  . gabapentin (NEURONTIN) capsule 900 mg  900 mg Oral BID Nishant Dhungel, MD   900 mg at 05/27/15 1127  . heparin injection 5,000 Units  5,000 Units Subcutaneous 3 times per day Lisette Grinder, MD   5,000 Units at 05/27/15 1319  . insulin aspart (novoLOG) injection 0-15 Units  0-15 Units Subcutaneous 6 times per day Rodolph Bong, MD   3 Units at 05/27/15 1257  . levETIRAcetam (KEPPRA) tablet 1,000 mg  1,000 mg Oral BID Heron Sabins, MD   1,000 mg at 05/27/15 1129  . magnesium oxide (MAG-OX) tablet 800 mg  800 mg Oral Daily Nishant Dhungel, MD   800 mg at 05/27/15 1128  . pantoprazole (PROTONIX) EC tablet 40 mg  40 mg Oral Daily Nishant Dhungel, MD   40 mg at 05/27/15 1129  . potassium chloride SA (K-DUR,KLOR-CON) CR tablet 20 mEq  20 mEq Oral Once Dietrich Pates V, MD      . senna-docusate (Senokot-S) tablet 1 tablet  1 tablet Oral QHS PRN Eddie North, MD         Discharge Medications: Please see discharge summary for a list of discharge medications.  Relevant Imaging Results:  Relevant Lab Results:   Additional Information SSN: [id number] [place], [place]

## 2015-05-27 NOTE — Progress Notes (Signed)
CSW consulted for SNF placement  CS[facility] completed [facility] application with [facility]  CSW will continue to follow  Merlyn LotJenna Holoman, [facility] Clinical Social Worker [phone]

## 2015-05-27 NOTE — Progress Notes (Addendum)
1644 patient had 5 beat run of Vtach. Patient asleep and denies associated symptoms.   1651: BP 144/63 HR 63 Dr. Otelia LimesLindzen paged to make aware.   5:00 PM Tenny Crawoss, MD cardiology paged to make aware. MD stated she will place orders.

## 2015-05-27 NOTE — Progress Notes (Signed)
Physical Therapy Treatment Patient Details Name: Ian Garcia MRN: [id number] DOB: 1955/12/08 Today's Date: 05/27/2015    History of Present Illness Patient is a 60 yo male admitted 05/22/15 with Lt sided weakness and decreased sensation.  MRI showed acute Rt MCA infarct.  Patient also with thrombus in Rt ICA.    PMH:  DM, neuropathy, PVD, bilateral AKA's, HTN, seizure disorder, CVA, carotid stenosis, tobacco use, CHF with EF 35%    PT Comments    Pt motivated to sit edge of bed.  Pt demonstrates improved tolerance and ease of transfer but remains to require total assist secondary to weakness and L sided neglect and flaccid LUE.  Pt performed increased time sitting edge of bed.  Pt reports feeling better when sitting upright.     Follow Up Recommendations  SNF;Supervision/Assistance - 24 hour     Equipment Recommendations  None recommended by PT    Recommendations for Other Services       Precautions / Restrictions Precautions Precautions: Fall Restrictions Weight Bearing Restrictions: No    Mobility  Bed Mobility Overal bed mobility: Needs Assistance;+2 for physical assistance Bed Mobility: Rolling;Supine to Sit;Sit to Supine Rolling: Total assist;Max assist;+2 for physical assistance (Pt performed rolling to R with total +2 assist.  Pt performed +2 max assist to roll to the L.  )   Supine to sit: Total assist;+2 for physical assistance (Pt tries to pull on PTA for railing but unable to transfer.  Pt required pillow under shoulders and +2 assist to elevate into seated position.  ) Sit to supine: Total assist;+2 for physical assistance (Pt able to assist with RUE to pull in trendelberg to advance position in bed.  )   General bed mobility comments: Pt remains to required verbal and tactile cues for bed mobs.  Pt sat edge of bed x 5 min with +2 mod/max assist with use of R rail to support in sitting.  Pt demonstrates lateral lean to left.  PTA attempted to facilitate L elbow  blocking to right balance, pt able to tolerate for a brief period before loosing balance posteriorly.  Pt demonstrates kyphotic posturing and required tactile cues to improve head positioning.    Transfers Overall transfer level:  (Pt remains unsteady with trunk control, unsafe for transfers from bed to chair.  )                  Ambulation/Gait                 Stairs            Wheelchair Mobility    Modified Rankin (Stroke Patients Only)       Balance                                    Cognition Arousal/Alertness: Awake/alert Behavior During Therapy: WFL for tasks assessed/performed Overall Cognitive Status: Impaired/Different from baseline Area of Impairment: Safety/judgement;Problem solving         Safety/Judgement: Decreased awareness of deficits;Decreased awareness of safety   Problem Solving: Slow processing;Decreased initiation;Difficulty sequencing;Requires verbal cues;Requires tactile cues General Comments: L sided neglect.      Exercises      General Comments        Pertinent Vitals/Pain Pain Assessment: No/denies pain    Home Living  Prior Function            PT Goals (current goals can now be found in the care plan section) Acute Rehab PT Goals Patient Stated Goal: to get up in recliner Potential to Achieve Goals: Fair Progress towards PT goals: Progressing toward goals    Frequency  Min 3X/week    PT Plan      Co-evaluation             End of Session Equipment Utilized During Treatment: Gait belt Activity Tolerance: Patient limited by lethargy;Patient limited by fatigue Patient left: in bed;with call bell/phone within reach;with bed alarm set     Time: 1414-1430 PT Time Calculation (min) (ACUTE ONLY): 16 min  Charges:  $Therapeutic Activity: 8-22 mins                    G Codes:      Florestine Avers 06-06-2015, 2:52 PM  Joycelyn Rua, PTA pager  [phone]

## 2015-05-27 NOTE — Progress Notes (Signed)
STROKE TEAM PROGRESS NOTE   HISTORY OF PRESENT ILLNESS Ian Garcia is an 60 y.o. 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  He was admitted to the hospitalist service at [facility]. As part of the stroke workup he had ultrasound of the carotids done which showed a near occlusive thrombus in the right ICA. MRI of the brain was performed which was of poor quality due to motion artifacts. It Did show a posterior parietal acute right MCA infarct.  Due to the abnormality seen on the carotid ultrasound with a near occlusive acute thrombus, he was transferred to [facility] for further evaluation and management options including no any further interventions. His been transferred to the ICU. Patient is a poor historian. Reports living alone. No family available at bedside.  Date last known well: 05/21/2015 Time last known well: Unknown tPA Given: No: Outside time window   SUBJECTIVE (INTERVAL HISTORY) Patient remained stable. He has been seen by Dr. Durwin Nora from vascular surgery and final decision on carotid revascularization is pending.    OBJECTIVE Temp:  [97.8 F (36.6 C)-99 F (37.2 C)] 98.3 F (36.8 C) (03/20 1415) Pulse Rate:  [63-85] 63 (03/20 1651) Cardiac Rhythm:  [-] Normal sinus rhythm (03/20 2000) Resp:  [17-20] 17 (03/20 1651) BP: (135-166)/(63-84) 144/63 mmHg (03/20 1651) SpO2:  [92 %-95 %] 92 % (03/20 1651) Weight:  [81.647 kg (180 lb)] 81.647 kg (180 lb) (03/20 1344)  CBC:   Recent Labs Lab 05/22/15 1943  05/23/15 0917 05/24/15 0515  WBC 10.6* 8.3 8.3  NEUTROABS 7.6  --   --   HGB 15.1 13.4 13.5  HCT 45.9 41.8 41.6  MCV 81.8 83.1 85.1  PLT 330 278 242    Basic Metabolic Panel:   Recent Labs Lab 05/24/15 0927 05/25/15 0343 05/27/15 1935  NA  --  142 138  K  --  3.7 4.4  CL  --  111 108  CO2  --  22 22  GLUCOSE  --  105* 272*  BUN  --  12 12  CREATININE  --  1.61* 1.72*  CALCIUM  --  7.7* 7.9*  MG 1.8  --  1.6*  PHOS 2.6  --   --     Lipid Panel:     Component Value Date/Time   CHOL 264* 05/24/2015 0357   TRIG 271* 05/24/2015 0357   HDL 30* 05/24/2015 0357   CHOLHDL 8.8 05/24/2015 0357   VLDL 54* 05/24/2015 0357   LDLCALC 180* 05/24/2015 0357   HgbA1c:  Lab Results  Component Value Date   HGBA1C 12.8* 05/24/2015   Urine Drug Screen: No results found for: LABOPIA, COCAINSCRNUR, LABBENZ, AMPHETMU, THCU, LABBARB    IMAGING I have personally reviewed the radiological images below and agree with the radiology interpretations.  CT  Head Wo Contrast 05/22/2015   1. Subacute nonhemorrhagic infarction high in the right parietal lobe with secondary edema of that portion of the brain without midline shift.  2. Multiple small scalp contusions.   [name] Head Wo Contrast 05/23/2015   Motion degraded. No emergent large vessel occlusion to correspond with the acute posterior right MCA infarct.  Mr Brain Wo Contrast 05/23/2015   1. Acute posterior right MCA territory infarct corresponding to the abnormal CT appearance yesterday. Cytotoxic edema without intracranial mass effect at this time.  2. Intracranial MRA from today reported separately.  3. Moderately to severely motion degraded and the examination had to be discontinued prior to completion due to patient agitation.  4. Consider surveillance for associated intracranial hemorrhage and mass effect with noncontrast CT.   US Renal 05/23/2015   1. No hydronephrosis.  2. Echogenic normal size kidneys, indicating  nonspecific renal parenchymal disease of uncertain chronicity.  3. Mild diffuse bladder wall thickening and trabeculation, suggesting chronic bladder voiding dysfunction such as due to chronic bladder outlet obstruction by an enlarged prostate gland. The prostate gland is not evaluated on this scan.   US Carotid Bilateral 05/23/2015   Right: Nearly occlusive centralized thrombus/plaque within the right common carotid artery which contributes to post stenotic and decreased antegrade flow of the right ICA. This plaque is favored to represent acute embolism/thrombus given the morphology. Formal cerebral angiogram is recommended. As a second best test, CTA. Left: The patient is status post left carotid endarterectomy in September 2011, with atherosclerotic changes of the common carotid artery. Established duplex criteria have not been validated for assessment after surgery, however, there is no evidence of compromised flow. High diastolic component of the flow on the left suggests increase contribution, potentially to the right hemisphere given the small infarction on recent CT.   EEG - This is an abnormal EEG demonstrating posterior slowing on the right compared to that of the left, consistent with the patient's history of a new right PCA infarct. There was no epileptiform activity recorded on this recording.  2D echo - Upper normal LV chamber size with mild LVH and LVEF approximately  35%. There is diffuse hypokinesis with akinesis of the basal  inferolateral/inferior wall. Grade 1 diastolic dysfunction with  indeterminate filling pressure. Upper normal left atrial chamber  size. Trivial mitral regurgitation. No obvious PFO or ASD.   PHYSICAL EXAM  Temp:  [97.8 F (36.6 C)-99 F (37.2 C)] 98.3 F (36.8 C) (03/20 1415) Pulse Rate:  [63-85] 63 (03/20 1651) Resp:  [17-20] 17 (03/20 1651) BP: (135-166)/(63-84) 144/63 mmHg (03/20 1651) SpO2:  [92 %-95 %] 92 % (03/20 1651) Weight:  [81.647 kg  (180 lb)] 81.647 kg (180 lb) (03/20 1344)  General - cachetic looking, well developed, in no apparent distress.  Ophthalmologic - Fundi not visualized due to noncooperation.  Cardiovascular - Regular rate and rhythm.  Mental Status -  Level of arousal and orientation to time, place, and person were intact. Language including expression, naming, repetition, comprehension was assessed and found intact. Fund of Knowledge was assessed and was intact.  Cranial Nerves II - XII - II - inconsistent exam, questionable left hemianopia. Does not blink to threat bilaterally and poor effort on exam when testing EOM. But lateral gaze bilaterally  intact when I move to each side and ask him questions on the colors of my clothes which he answers correctly.  III, IV, VI - difficulty with bilateral lateral gaze, eyes mid position. Does not blink to threat and poor effort on exam when testing EOM. But lateral gaze intact when I move to each side and ask him questions on the colors of my clothes which he answers correctly.  V - Facial sensation intact bilaterally. VII - Facial movement intact bilaterally. VIII - Hearing & vestibular intact bilaterally. X - Palate elevates symmetrically. XI -  Chin turning & shoulder shrug intact bilaterally. XII - Tongue protrusion intact. Motor Strength - The patient's strength was 0/5 LUE and left leg stump, 5/5 RUE and right leg stump, bilateral AKA.  Bulk was normal and fasciculations were absent.   Motor Tone - Muscle tone was assessed at the neck and appendages and was normal.  Reflexes - The patient's reflexes were 1+ in UEs  Sensory - Light touch, temperature/pinprick were assessed and were symmetrical.    Coordination - The patient had normal movements in the right hand with no ataxia or dysmetria.  Tremor was absent.  Gait and Station - not able to test.   ASSESSMENT/PLAN Mr. ZAINE ELSASS is a 60 y.o. male with history of DM, seizures, htn, previous  stroke, hld, and carotid stenosis presenting with acute onset of left-sided weakness. He did not receive IV t-PA due to late presentation.  Stroke:  Non-dominant infarct probably embolic from right carotid artery stenosis.  Resultant  Left hemiplegia  MRI  Acute posterior right MCA territory infarct with cytotoxic edema  MRA  No emergent large vessel occlusion to correspond with the acute posterior right MCA infarct.   Carotid Doppler - near occlusion of right carotid artery.   2D Echo - EF 35%. No PFO. No cardiac source of emboli identified.  LDL - 180  HgbA1c 12.6  VTE prophylaxis - subcutaneous heparin Diet Carb Modified Fluid consistency:: Thin; Room service appropriate?: Yes  clopidogrel 75 mg daily prior to admission, now on aspirin 325 mg daily and clopidogrel 75 mg daily. Continue DAPT.   Patient counseled to be compliant with his antithrombotic medications  Ongoing aggressive stroke risk factor management  Therapy recommendations: SNF recommended  Disposition: Pending  Right ICA near occlusion  Cause of right MCA infarct  Low BP on dopamine  TTE EF 35%  On IVF, BP goal 130-150  Vascular Surgery consult - 05/25/15 - CTA of Neck recommended when safe. ( Creat - 1.61 GFR - 46 ). Would not be a surgical candidate right away.  Appreciate Dr Adele Dan consult.  Hypotension   On IVF  Off dopamine  May consider midodrine and florinef if still low BP  EF 35%  CCM on board  Cardiomyopathy   EF 35%  Low BP  Appreciate cardiology recommendations:  Systolic CHF: LVEF 35%. Very poor candidate for invasive cardiology procedures. Has CKD. Creatinine mildly improved today to 1.6. Not able to add ACE-I/ARB due to this. On aspirin and plavix. Start low dose coreg 3.125 mg BID as HR has improved up to the 70-80 range.  ? Seizure  Hx of seizure on keppra   Continue keppra  bid  Reported event of left arm shaking over night. Received ativan  EEG showed  right slowing, no seizure  Hyperlipidemia  Home meds:  Lipitor 80 mg daily resumed in hospital  LDL 180, goal < 70  Consider adding Zetia.  Continue statin at discharge  Diabetes  HgbA1c 12.6, goal < 7.0  Uncontrolled  Noncompliance  Tobacco abuse  Current smoker  Smoking cessation counseling provided  Pt is NOT willing to quit  Other Stroke Risk Factors  Hx stroke/TIA  S/p left CEA  Other Active Problems  Elevated creatinine 1.86 -> 1.61  Hyperkalemia 5.2 - repeat in Saturday -> 3.7  Urine culture - 10,000 colonies ENTEROCOCCUS SPECIES  Blood cultures pending - no growth x 3 days  B/l AKA  Cardiomyopathy EF 35% - NSVT - Cardiology following - appreciate assistance.  Poor  medical compliance  Repeat head CT to f/u bleed - will order for Tuesday   Hospital day # 685  60 year old patient with right ICA near occlusion, low BP, cardiomyopathy, seizure and at significant risk of neurological worsening, death form recurrent stroke, hemorrhagic transformation, seizure, heart failure. The patient needs right carotid revascularization and vascular surgery has been consulted. Personally examined patient and images, and have participated in and made any corrections needed to history, physical, neuro exam,assessment and plan as stated above.  I have personally obtained the history, evaluated lab date, reviewed imaging studies and agree with radiology interpretations.   Delia HeadyPramod Anaria Kroner, MD          To contact Stroke Continuity provider, please refer to [email or web address]. After hours, contact General Neurology

## 2015-05-27 NOTE — Progress Notes (Signed)
   VASCULAR SURGERY ASSESSMENT & PLAN:  * The patient has a symptomatic right carotid stenosis.  I would recommend a CT angiogram of the neck to further assess his right carotid disease. The patient does have mild chronic renal insufficiency and I will defer to the primary service as to if this would be safe to do and the timing associated with this. I would agree that given his profound left upper extremity weakness with secondary edema noted on his CT scan of the head, I do not think he is a good candidate for carotid endarterectomy within the next week or so. He is currently on aspirin, Plavix, and a statin. I agree with continuing maximal medical therapy at this point and I can make further recommendations if or when it is safe to obtain a CT angiogram.  SUBJECTIVE: No complaints. No new symptoms.  PHYSICAL EXAM: Filed Vitals:   05/26/15 1735 05/26/15 2053 05/27/15 0030 05/27/15 0331  BP: 118/63 140/82 141/67 147/78  Pulse: 64 72 81 75  Temp:  97.9 F (36.6 C) 97.8 F (36.6 C) 98.1 F (36.7 C)  TempSrc:  Oral Oral Oral  Resp:  18 18 20   Height:      Weight:      SpO2: 93% 95% 92% 93%   No change in profound left upper extremity weakness.  LABS: Lab Results  Component Value Date   WBC 8.3 05/24/2015   HGB 13.5 05/24/2015   HCT 41.6 05/24/2015   MCV 85.1 05/24/2015   PLT 242 05/24/2015   Lab Results  Component Value Date   CREATININE 1.61* 05/25/2015   Lab Results  Component Value Date   INR 1.02 11/19/2009   CBG (last 3)   Recent Labs  05/26/15 2122 05/27/15 0030 05/27/15 0333  GLUCAP 279* 218* 132*    Principal Problem:   Acute ischemic stroke (HCC) Active Problems:   Type II diabetes mellitus with complication, uncontrolled (HCC)   Peripheral vascular disease due to secondary diabetes mellitus (HCC)   Seizure disorder (HCC)   CVA (cerebral infarction)   Acute kidney injury (HCC)   Below knee amputation status (HCC)   Tobacco abuse   Cerebral  infarction due to unspecified mechanism   S/P AKA (above knee amputation) bilateral (HCC)   Cardiomyopathy (HCC)   Hypotension   Arterial hypotension   Right middle cerebral artery stroke [facility](HCC)    Cari Carawayhris Dickson Beeper: [phone]: [phone] 05/27/2015

## 2015-05-28 ENCOUNTER — Encounter (HOSPITAL_COMMUNITY): Payer: Self-pay | Admitting: Radiology

## 2015-05-28 ENCOUNTER — Inpatient Hospital Stay (HOSPITAL_COMMUNITY): Payer: Non-veteran care

## 2015-05-28 LAB — GLUCOSE, CAPILLARY
GLUCOSE-CAPILLARY: 179 mg/dL — AB (ref 65–99)
GLUCOSE-CAPILLARY: 214 mg/dL — AB (ref 65–99)
GLUCOSE-CAPILLARY: 144 mg/dL — AB (ref 65–99)
Glucose-Capillary: 126 mg/dL — ABNORMAL HIGH (ref 65–99)
Glucose-Capillary: 194 mg/dL — ABNORMAL HIGH (ref 65–99)

## 2015-05-28 LAB — BASIC METABOLIC PANEL
ANION GAP: 9 (ref 5–15)
BUN: 11 mg/dL (ref 6–20)
CO2: 23 mmol/L (ref 22–32)
CREATININE: 1.56 mg/dL — AB (ref 0.61–1.24)
Calcium: 8 mg/dL — ABNORMAL LOW (ref 8.9–10.3)
Chloride: 108 mmol/L (ref 101–111)
GFR calc non Af Amer: 47 mL/min — ABNORMAL LOW (ref >60)
GFR, EST AFRICAN AMERICAN: 55 mL/min — AB (ref >60)
Glucose, Bld: 185 mg/dL — ABNORMAL HIGH (ref 65–99)
POTASSIUM: 4.1 mmol/L (ref 3.5–5.1)
Sodium: 140 mmol/L (ref 135–145)

## 2015-05-28 LAB — TYPE AND SCREEN
ABO/RH(D): B POS
ANTIBODY SCREEN: NEGATIVE

## 2015-05-28 LAB — ABO/RH: ABO/RH(D): B POS

## 2015-05-28 NOTE — Progress Notes (Signed)
Patient Name: Ian Garcia Date of Encounter: 05/28/2015   SUBJECTIVE  Denies chest pain or sob.   CURRENT MEDS .  stroke: mapping our early stages of recovery book   Does not apply Once  . amitriptyline  25 mg Oral QHS  . aspirin  325 mg Oral Daily  . atorvastatin  80 mg Oral q1800  . carvedilol  3.125 mg Oral BID WC  . cholecalciferol  800 Units Oral Daily  . clopidogrel  75 mg Oral Daily  . gabapentin  900 mg Oral BID  . heparin  5,000 Units Subcutaneous 3 times per day  . insulin aspart  0-15 Units Subcutaneous 6 times per day  . levETIRAcetam  1,000 mg Oral BID  . magnesium oxide  800 mg Oral Daily  . pantoprazole  40 mg Oral Daily    OBJECTIVE  Filed Vitals:   05/27/15 2123 05/28/15 0500 05/28/15 0518 05/28/15 0852  BP: 151/66  163/82 166/81  Pulse: 75  74 76  Temp: 98.9 F (37.2 C)  97.7 F (36.5 C)   TempSrc: Oral     Resp: 20  20   Height:      Weight:  177 lb (80.287 kg)    SpO2: 93%  93%     Intake/Output Summary (Last 24 hours) at 05/28/15 1156 Last data filed at 05/28/15 0545  Gross per 24 hour  Intake 916.75 ml  Output   1450 ml  Net -533.25 ml   Filed Weights   05/23/15 1545 05/27/15 1344 05/28/15 0500  Weight: 164 lb 0.4 oz (74.4 kg) 180 lb (81.647 kg) 177 lb (80.287 kg)    PHYSICAL EXAM  General: Pleasant, NAD. Neuro: Alert and oriented X 3. Moves all extremities spontaneously. Psych: Normal affect. HEENT:  Normal  Neck: Supple without bruits or JVD Lungs:  Resp regular and unlabored, CTA. Heart: RRR no s3, s4, or murmurs. Abdomen: Soft, non-tender, non-distended, BS + x 4.  Extremities: No clubbing, cyanosis or edema. Radials 2+ and equal bilaterally. S/p bilateral AKA  Accessory Clinical Findings  CBC No results for input(s): WBC, NEUTROABS, HGB, HCT, MCV, PLT in the last 72 hours. Basic Metabolic Panel  Recent Labs  05/27/15 1935 05/28/15 0535  NA 138 140  K 4.4 4.1  CL 108 108  CO2 22 23  GLUCOSE 272* 185*  BUN  12 11  CREATININE 1.72* 1.56*  CALCIUM 7.9* 8.0*  MG 1.6*  --     TELE  Sinus rhythm  Radiology/Studies  Dg Chest 2 View  05/22/2015  CLINICAL DATA:  De [place] several times since Thursday, BILATERAL lower extremity amputee, diabetes mellitus, peripheral vascular disease, smoker EXAM: CHEST  2 VIEW COMPARISON:  09/18/2006 FINDINGS: Upper normal heart size. Mediastinal contours and pulmonary vascularity normal. Lungs clear. No pleural effusion or pneumothorax. Bones unremarkable. IMPRESSION: No acute abnormalities. Electronically Signed   By: Ulyses Southward M.D.   On: 05/22/2015 17:37   Ct Head Wo Contrast  05/28/2015  CLINICAL DATA:  Follow-up examination for stroke. EXAM: CT HEAD WITHOUT CONTRAST TECHNIQUE: Contiguous axial images were obtained from the base of the skull through the vertex without intravenous contrast. COMPARISON:  Prior study from 05/23/2015. FINDINGS: Generalized cerebral atrophy with chronic microvascular ischemic disease again seen Evolving subacute posterior right MCA territory infarct involving the posterior right frontal and parietal region again seen. Since the previous, the associated hypodensity is more well-defined in nature. No associated hemorrhage or mass effects. No other complication. No other large vessel  territory infarct identified. No intracranial hemorrhage. No mass lesion or midline shift. No hydrocephalus. No extra-axial fluid collection. Scalp soft tissues within normal limits. No acute abnormality about the orbits. Mild polypoid mucosal thickening within the maxillary sinuses. Paranasal sinuses are otherwise clear. Chronic appearing right mastoid effusion noted. Left mastoid air cells clear. Calvarium intact. IMPRESSION: Continued normal expected interval evolution of moderate sized right MCA infarct. No hemorrhagic transformation or other complication identified. No other new intracranial process. Electronically Signed   By: Rise MuBenjamin  McClintock M.D.   On:  05/28/2015 07:09   Ct Head Wo Contrast  05/22/2015  CLINICAL DATA:  Multiple recent falls. The patient now has left arm weakness. EXAM: CT HEAD WITHOUT CONTRAST CT CERVICAL SPINE WITHOUT CONTRAST TECHNIQUE: Multidetector CT imaging of the head and cervical spine was performed following the standard protocol without intravenous contrast. Multiplanar CT image reconstructions of the cervical spine were also generated. COMPARISON:  CT scan dated 11/19/2009 FINDINGS: CT HEAD FINDINGS There is a subacute nonhemorrhagic infarction high in the right parietal lobe with secondary edema and obliteration of adjacent cortical sulci. No midline shift. No hemorrhage. There is diffuse slight cerebral cortical atrophy. Old infarcts in the right frontoparietal region. No epidural or subdural or interventricular hemorrhage. No acute bone abnormality. Scalp contusions over the left occipital bone and high over the left posterior parietal region. CT CERVICAL SPINE FINDINGS There is no fracture or subluxation or prevertebral soft tissue swelling. Soft disc protrusion with accompanying osteophytes central and to the left at C3-4. Broad-based disc protrusion with accompanying osteophytes at C5-6. Calcification in the right carotid bifurcation. Surgical clips around the left carotid bifurcation. IMPRESSION: 1. Subacute nonhemorrhagic infarction high in the right parietal lobe with secondary edema of that portion of the brain without midline shift. 2. Multiple small scalp contusions. 3. No acute abnormality of the cervical spine. Electronically Signed   By: Francene BoyersJames  Maxwell M.D.   On: 05/22/2015 17:25   Ct Cervical Spine Wo Contrast  05/22/2015  CLINICAL DATA:  Multiple recent falls. The patient now has left arm weakness. EXAM: CT HEAD WITHOUT CONTRAST CT CERVICAL SPINE WITHOUT CONTRAST TECHNIQUE: Multidetector CT imaging of the head and cervical spine was performed following the standard protocol without intravenous contrast.  Multiplanar CT image reconstructions of the cervical spine were also generated. COMPARISON:  CT scan dated 11/19/2009 FINDINGS: CT HEAD FINDINGS There is a subacute nonhemorrhagic infarction high in the right parietal lobe with secondary edema and obliteration of adjacent cortical sulci. No midline shift. No hemorrhage. There is diffuse slight cerebral cortical atrophy. Old infarcts in the right frontoparietal region. No epidural or subdural or interventricular hemorrhage. No acute bone abnormality. Scalp contusions over the left occipital bone and high over the left posterior parietal region. CT CERVICAL SPINE FINDINGS There is no fracture or subluxation or prevertebral soft tissue swelling. Soft disc protrusion with accompanying osteophytes central and to the left at C3-4. Broad-based disc protrusion with accompanying osteophytes at C5-6. Calcification in the right carotid bifurcation. Surgical clips around the left carotid bifurcation. IMPRESSION: 1. Subacute nonhemorrhagic infarction high in the right parietal lobe with secondary edema of that portion of the brain without midline shift. 2. Multiple small scalp contusions. 3. No acute abnormality of the cervical spine. Electronically Signed   By: Francene BoyersJames  Maxwell M.D.   On: 05/22/2015 17:25   Mr Shirlee LatchMra Head Wo Contrast  05/23/2015  CLINICAL DATA:  60 year old male with altered mental status and left side weakness for 1 day. Agitation requiring exam  discontinuation prior to completion. Initial encounter. EXAM: MRA HEAD WITHOUT CONTRAST TECHNIQUE: Angiographic images of the Circle of Willis were obtained using MRA technique without intravenous contrast. COMPARISON:  Motion degraded brain MRI from today reported separately. Noncontrast head CT 05/22/2015. FINDINGS: Study is moderately degraded by motion artifact despite repeated imaging attempts. Antegrade flow signal in the basilar artery. The basilar tip in proximal PCAs are patent with preserved flow signal.  Antegrade flow signal in both ICA siphons. Both carotid termini appear patent with flow signal. Patent MCA and ACA origins. The right MCA M1 segment and bifurcation appear remain patent. Likewise, proximal left MCA and ACA is appear patent. Motion artifact otherwise degrades intracranial vessel detail. IMPRESSION: Motion degraded. No emergent large vessel occlusion to correspond with the acute posterior right MCA infarct. Electronically Signed   By: Odessa Fleming M.D.   On: 05/23/2015 11:23   Mr Brain Wo Contrast  05/23/2015  CLINICAL DATA:  60 year old male with altered mental status and left side weakness for 1 day. Agitation requiring exam discontinuation prior to completion. Initial encounter. EXAM: MRI HEAD WITHOUT CONTRAST TECHNIQUE: Multiplanar, multiecho pulse sequences of the brain and surrounding structures were obtained without intravenous contrast. COMPARISON:  Head and cervical spine CT without contrast 05/22/2015. Brain MRI 11/20/2009. FINDINGS: Study is moderately to severely degraded by motion artifact despite repeated imaging attempts. Also the examination had to be discontinued prior to completion due to patient agitation. Only motion degraded axial diffusion, coronal diffusion, axial T2 imaging, and sagittal T1 weighted imaging was obtained 5-6 cm area of confluent restricted diffusion in the posterior superior right hemisphere, corresponding to posterior right MCA vascular territory. Several small areas of cortical restricted diffusion superimposed in the lateral right occipital lobe. No contralateral or posterior fossa restricted diffusion is evident. Basilar cisterns remain patent. No ventriculomegaly. Mild cytotoxic edema evident on T1 weighted imaging at this time. IMPRESSION: 1. Acute posterior right MCA territory infarct corresponding to the abnormal CT appearance yesterday. Cytotoxic edema without intracranial mass effect at this time. 2. Intracranial MRA from today reported separately. 3.  Moderately to severely motion degraded and the examination had to be discontinued prior to completion due to patient agitation. 4. Consider surveillance for associated intracranial hemorrhage and mass effect with noncontrast CT. Electronically Signed   By: Odessa Fleming M.D.   On: 05/23/2015 11:21   US Renal  05/23/2015  CLINICAL DATA:  Acute renal failure. EXAM: RENAL / URINARY TRACT ULTRASOUND COMPLETE COMPARISON:  None. FINDINGS: Right Kidney: Length: 12.2 cm. Echogenic right kidney. No renal parenchymal atrophy. No renal mass. No hydronephrosis. Left Kidney: Length: 10.7 cm. Echogenic left kidney. No renal parenchymal atrophy. No renal mass. No hydronephrosis. Bladder: Both ureteral jets are demonstrated in the bladder lumen. Mild diffuse bladder wall thickening and trabeculation. IMPRESSION: 1. No hydronephrosis. 2. Echogenic normal size kidneys, indicating nonspecific renal parenchymal disease of uncertain chronicity. 3. Mild diffuse bladder wall thickening and trabeculation, suggesting chronic bladder voiding dysfunction such as due to chronic bladder outlet obstruction by an enlarged prostate gland. The prostate gland is not evaluated on this scan. Electronically Signed   By: Delbert Phenix M.D.   On: 05/23/2015 08:36   US Carotid Bilateral  05/23/2015  CLINICAL DATA:  60 year old male with a history of prior stroke. Cardiovascular risk factors include hypertension, stroke/TIA, hyperlipidemia, diabetes, tobacco use. EXAM: BILATERAL CAROTID DUPLEX ULTRASOUND TECHNIQUE: Wallace Cullens scale imaging, color Doppler and duplex ultrasound were performed of bilateral carotid and vertebral arteries in the neck. COMPARISON:  CT  brain 05/22/2015, angiogram 11/22/2009 FINDINGS: Criteria: Quantification of carotid stenosis is based on velocity parameters that correlate the residual internal carotid diameter with NASCET-based stenosis levels, using the diameter of the distal internal carotid lumen as the denominator for stenosis  measurement. The following velocity measurements were obtained: RIGHT ICA:  Systolic 63 cm/sec, Diastolic 31 cm/sec CCA:  521 cm/sec SYSTOLIC ICA/CCA RATIO:  0.1 ECA:  59 cm/sec LEFT ICA:  Systolic 98 cm/sec, Diastolic 27 cm/sec CCA:  103 cm/sec SYSTOLIC ICA/CCA RATIO:  1.0 ECA:  57 cm/sec Right Brachial SBP: Not acquired Left Brachial SBP: Not acquired RIGHT CAROTID ARTERY: Significant atherosclerotic changes of the right common carotid artery with intimal thickening. Antegrade flow with high resistant waveform in the mid common carotid. Maximum velocity 521 centimeter/second. There is heterogeneously hypoechoic plaque/ thrombus centralized within the lumen of the common carotid artery. Heterogeneous and partially calcified plaque at the carotid bifurcation. Significantly decreased flow of the external carotid artery. Parvus tardus waveform of the internal carotid artery. RIGHT VERTEBRAL ARTERY: Antegrade flow with low resistance waveform. LEFT CAROTID ARTERY: Patient is status post left-sided carotid endarterectomy. Heterogeneous atherosclerotic changes of the left common carotid artery. Intermediate waveform maintained. Heterogeneous plaque/surgical changes of the carotid bifurcation. No significant shadowing across the lumen. ICA remains patent with low resistance antegrade waveform. High diastolic component on the left within the ICA. No significant tortuosity. LEFT VERTEBRAL ARTERY:  Antegrade flow with low resistance waveform. IMPRESSION: Right: Nearly occlusive centralized thrombus/plaque within the right common carotid artery which contributes to post stenotic and decreased antegrade flow of the right ICA. This plaque is favored to represent acute embolism/thrombus given the morphology. Formal cerebral angiogram is recommended. As a second best test, CTA. Left: The patient is status post left carotid endarterectomy in September 2011, with atherosclerotic changes of the common carotid artery. Established  duplex criteria have not been validated for assessment after surgery, however, there is no evidence of compromised flow. High diastolic component of the flow on the left suggests increase contribution, potentially to the right hemisphere given the small infarction on recent CT. These results were called by telephone at the time of interpretation on 05/23/2015 at 9:09 am to Dr. Verdie Mosher, who verbally acknowledged these results. Signed, Yvone Neu. Loreta Ave, DO [facility] Radiology Electronically Signed   By: Gilmer Mor D.O.   On: 05/23/2015 09:10   Dg Shoulder Left  05/22/2015  CLINICAL DATA:  De Hollingshead several times since last Thursday and unable to raise left arm. EXAM: LEFT SHOULDER - 2+ VIEW COMPARISON:  None. FINDINGS: There is no evidence of fracture or dislocation. There is no evidence of arthropathy or other focal bone abnormality. Soft tissues are unremarkable. IMPRESSION: Negative. Electronically Signed   By: Kennith Center M.D.   On: 05/22/2015 17:37    ASSESSMENT AND PLAN   60 year old male with history of DM, diabetic neuropathy, seizures, HTN, GERD, PAD, CVA, carotid stenosis w/ L-CEA, HLD, ongoing tobacco. No prior cardiac history.Admitted 03/15 w/ CVA, EF 35% by echo, +WMA. No cath 2nd poor renal function, acute CVA, and PAD w/ bilateral AKAs. Needs R-CEA but not for a week or so, per [facility]  Principal Problem:   Acute ischemic stroke [facility]) Active Problems:   Type II diabetes mellitus with complication, uncontrolled (HCC)   Peripheral vascular disease due to secondary diabetes mellitus (HCC)   Seizure disorder (HCC)   CVA (cerebral infarction)   Acute kidney injury (HCC)   Below knee amputation status (HCC)  Tobacco abuse   Cerebral infarction due to unspecified mechanism   S/P AKA (above knee amputation) bilateral (HCC)   Cardiomyopathy (HCC)   Hypotension   Arterial hypotension   Right middle cerebral artery stroke (HCC)   Acute  systolic CHF (congestive heart failure), NYHA class 3 (HCC)    1. Acute Systolic CHF: LVEF 35%. Very poor candidate for invasive cardiology procedures with his multiple medical problems On aspirin and plavix. Continue low dose coreg 3.125 mg BID Cannot use ACE or ARB - Given Lasix 20 mg IV x 1--> diuresed insignificantly egative 3.3cc yesterday. Total net + 4.2L since admission. Weight down 3lb since yesterday. Decrease IVF to [facility]. PO intake is good. Scr improved to 1.56.   2. Embolic stroke: Likely from his critical carotid disease. Seen by vascular surgery, plan for possible surgery when improved . On ASA and Plavix.  3. Dyslipidemia - on lipitor 80 mg daily now.   Signed, Bhagat,Bhavinkumar PA-C Pager [phone]  PT seen and examined  Agree with findings as noted above by B Bhagat Pt is resting comfortably Lungs rel clear  Cardiac RRR  Ext s/p bilateral AKA Check BMET and BNP in AM  Hold lasix forn ow.    Dietrich Pates

## 2015-05-28 NOTE — Clinical Social Work Note (Signed)
Clinical Social Work Assessment  Patient Details  Name: Ian Garcia MRN: [id number] Date of Birth: [birth year]  Date of referral:  05/28/15               Reason for consult:  Facility Placement                Permission sought to share information with:  Family Supports, Oceanographeracility Contact Representative Permission granted to share information::  Yes, Verbal Permission Granted  Name::     Surveyor, mineralsTeresa  Agency::  [facility] SN[facility]  Relationship::  sister  Contact Information:     Housing/Transportation Living arrangements for the past 2 months:  Single Family Home Source of Information:  Patient Patient Interpreter Needed:  None Criminal Activity/Legal Involvement Pertinent to Current Situation/Hospitalization:  No - Comment as needed Significant Relationships:  Siblings Lives with:  Self Do you feel safe going back to the place where you live?  No Need for family participation in patient care:  No (Coment)  Care giving concerns:  Pt lives at home alone- usually able to care for himself despite being wheelchair bound.  Has an aid that comes in a few times a week to help with chores and his sister who lives over an hour away also helps a few times a month- currently needing 24 hour help due to new onset weakness.   Social Worker assessment / plan: CSW spoke with pt about PT recommendation for SNF.  Explained VA placement and completed VA paperwork with pt.  Pt has been in SNF before (believes it was in [place]) and understands SNF set up.  Employment status:  Disabled (Comment on whether or not currently receiving Disability) Insurance information:  VA Benefit PT Recommendations:  Skilled Nursing Facility Information / Referral to community resources:  Skilled Nursing Facility  Patient/Family's Response to care: Pt is agreeable to short term [facility] SNF stay but ultimate goal is to return home- pt is hopeful this will be a very short stay.  Patient/Family's Understanding of and Emotional Response to  Diagnosis, Current Treatment, and Prognosis:  Unclear level of pt understanding- pt was alert and oriented during interview with some tangential thinking.  Pt maintains goals is to return to living independently.  Emotional Assessment Appearance:  Appears stated age Attitude/Demeanor/Rapport:    Affect (typically observed):  Appropriate, Pleasant Orientation:  Oriented to Self, Oriented to Place, Oriented to  Time, Oriented to Situation Alcohol / Substance use:  Not Applicable Psych involvement (Current and /or in the community):  No (Comment)  Discharge Needs  Concerns to be addressed:  Care Coordination Readmission within the last 30 days:  No Current discharge risk:  Physical Impairment Barriers to Discharge:  Continued Medical Work up, [employer] Authorization   Holoman, CiceroJenna M, LCSW 05/28/2015, 10:18 AM

## 2015-05-28 NOTE — Progress Notes (Signed)
Speech Language Pathology Treatment: Cognitive-Linquistic  Patient Details Name: Ian Garcia MRN: [id number] DOB: [birth year] Today's Date: 05/28/2015 Time: [id number] SLP Time Calculation (min) (ACUTE ONLY): 15 min  Assessment / Plan / Recommendation Clinical Impression  Pt seen for f/u cognitive therapy. Mod cues provided for basic verbal problem solving to increase orientation to time/location. He needed Min cues for working memory during [employer]menu-reading task, but Max cues for attention to left visual field to locate items. He shows some intellectual awareness of his left inattention, but not yet emergent awareness. Will continue to follow.   HPI HPI: 60 y.o. male with h/o DM, diabetic neuropathy, seizures, HTN, acid reflux, hypercholesteremia, peripheral vascular disease due to secondary DM, CVA, carotid stenosis s/p CEA on L and tobacco abuse, who presented to ED with acute L side weakness since 3/14. MR Brain 3/16 acute posterior R MCA territory infarct. MRA Head 3/16 no emergent large vessel occlusion to correspond with acute posterior R MCA infarct.      SLP Plan  Continue with current plan of care     Recommendations                Follow up Recommendations: Skilled Nursing facility Plan: Continue with current plan of care     GO               Maxcine HamLaura Paiewonsky, M.A. CCC-SLP [phone]  Maxcine Hamaiewonsky, Elika Godar 05/28/2015, 4:01 PM

## 2015-05-28 NOTE — Progress Notes (Signed)
CSW [facility] stating [facility].  CSW faxed referral out to all 5 of [facility] contracted facilities [facility] [place], [place], [place] beds at this time  [facility], [place], [place] referral sent- offer pending  [facility], [place], [place]- [place] beds at this time  [facility], [place], [place]- referral under review- currently has beds if pt is appropriate  [facility] [place], [place]- [place] sent- under review  CSW will continue to follow  Ian LotJenna Holoman, [facility] HospitalCSWA Clinical Social Worker [phone]

## 2015-05-28 NOTE — Progress Notes (Signed)
STROKE TEAM PROGRESS NOTE   HISTORY OF PRESENT ILLNESS Ian Garcia is an 60 y.o. 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  He was admitted to the hospitalist service at [facility]. As part of the stroke workup he had ultrasound of the carotids done which showed a near occlusive thrombus in the right ICA. MRI of the brain was performed which was of poor quality due to motion artifacts. It Did show a posterior parietal acute right MCA infarct.  Due to the abnormality seen on the carotid ultrasound with a near occlusive acute thrombus, he was transferred to [facility] for further evaluation and management options including no any further interventions. His been transferred to the ICU. Patient is a poor historian. Reports living alone. No family available at bedside.  Date last known well: 05/21/2015 Time last known well: Unknown tPA Given: No: Outside time window   SUBJECTIVE (INTERVAL HISTORY) No family members present. The patient voices no new complaints.    OBJECTIVE Temp:  [97.7 F (36.5 C)-98.9 F (37.2 C)] 98.4 F (36.9 C) (03/21 1524) Pulse Rate:  [74-76] 75 (03/21 1524) Cardiac Rhythm:  [-] Normal sinus rhythm (03/21 0731) Resp:  [18-20] 18 (03/21 1524) BP: (151-166)/(66-82) 152/75 mmHg (03/21 1524) SpO2:  [93 %] 93 % (03/21 1524) Weight:  [80.287 kg (177 lb)] 80.287 kg (177 lb) (03/21 0500)  CBC:   Recent Labs Lab 05/22/15 1943 05/23/15 0917 05/24/15 0515  WBC 10.6* 8.3 8.3  NEUTROABS 7.6  --   --   HGB  15.1 13.4 13.5  HCT 45.9 41.8 41.6  MCV 81.8 83.1 85.1  PLT 330 278 242    Basic Metabolic Panel:   Recent Labs Lab 05/24/15 0927  05/27/15 1935 05/28/15 0535  NA  --   < > 138 140  K  --   < > 4.4 4.1  CL  --   < > 108 108  CO2  --   < > 22 23  GLUCOSE  --   < > 272* 185*  BUN  --   < > 12 11  CREATININE  --   < > 1.72* 1.56*  CALCIUM  --   < > 7.9* 8.0*  MG 1.8  --  1.6*  --   PHOS 2.6  --   --   --   < > = values in this interval not displayed.  Lipid Panel:     Component Value Date/Time   CHOL 264* 05/24/2015 0357   TRIG 271* 05/24/2015 0357   HDL 30* 05/24/2015 0357   CHOLHDL 8.8 05/24/2015 0357   VLDL 54* 05/24/2015 0357   LDLCALC 180* 05/24/2015 0357   HgbA1c:  Lab Results  Component Value Date   HGBA1C 12.8* 05/24/2015   Urine Drug Screen: No results found for: LABOPIA, COCAINSCRNUR, LABBENZ, AMPHETMU, THCU, LABBARB    IMAGING I have personally reviewed the radiological images below and agree with the radiology interpretations.  CT Head Wo Contrast 05/22/2015   1. Subacute nonhemorrhagic infarction high in the right parietal lobe with secondary edema of that portion of the brain without midline shift.  2. Multiple small scalp contusions.  CT head without contrast 05/28/2015 Continued normal expected interval evolution of moderate sized right MCA infarct.  No hemorrhagic transformation or other complication identified.  No other new intracranial process.    [name] Head Wo Contrast 05/23/2015   Motion degraded. No emergent large vessel occlusion to correspond with the acute posterior right MCA infarct.   Mr Brain Wo Contrast 05/23/2015   1. Acute posterior right MCA territory infarct corresponding to the abnormal CT appearance yesterday. Cytotoxic edema without intracranial mass effect at this time.  2. Intracranial MRA from today reported separately.  3. Moderately to severely motion degraded and the examination had to be discontinued prior to  completion due to patient agitation.  4. Consider surveillance for associated intracranial hemorrhage and mass effect with noncontrast CT. (see above)  US Renal 05/23/2015   1. No hydronephrosis.  2. Echogenic normal size kidneys, indicating nonspecific renal parenchymal disease of uncertain chronicity.  3. Mild diffuse bladder wall thickening and trabeculation, suggesting chronic bladder voiding dysfunction such as due to chronic bladder outlet obstruction by an enlarged prostate gland. The prostate gland is not evaluated on this scan.   US Carotid Bilateral 05/23/2015   Right: Nearly occlusive centralized thrombus/plaque within the right common carotid artery which contributes to post stenotic and decreased antegrade flow of the right ICA. This plaque is favored to represent acute embolism/thrombus given the morphology. Formal cerebral angiogram is recommended. As a second best test, CTA. Left: The patient is status post left carotid endarterectomy in September 2011, with atherosclerotic changes of the common carotid artery. Established duplex criteria have not been validated for assessment after surgery, however, there is no evidence of compromised flow. High diastolic component of the flow on the left suggests increase contribution, potentially to the right hemisphere given the small infarction on recent CT.   EEG - This is an abnormal EEG demonstrating posterior slowing on the right compared to that of the left, consistent with the patient's history of a new right PCA infarct. There was no epileptiform activity recorded on this recording.  2D echo - Upper normal LV chamber size with mild LVH and LVEF approximately  35%. There is diffuse hypokinesis with akinesis of the basal  inferolateral/inferior wall. Grade 1 diastolic dysfunction with  indeterminate filling pressure. Upper normal left atrial chamber  size. Trivial mitral regurgitation. No obvious PFO or ASD.   PHYSICAL EXAM  Temp:   [97.7 F (36.5 C)-98.9 F (37.2 C)] 98.4 F (36.9 C) (03/21 1524) Pulse Rate:  [74-76] 75 (03/21 1524) Resp:  [18-20] 18 (03/21 1524) BP: (151-166)/(66-82) 152/75 mmHg (03/21 1524) SpO2:  [93 %] 93 % (03/21 1524) Weight:  [80.287 kg (177 lb)] 80.287 kg (177 lb) (03/21 0500)  General - cachetic looking, well developed, in no apparent distress.  Ophthalmologic - Fundi not visualized due to noncooperation.  Cardiovascular - Regular rate and rhythm.  Mental Status -  Level of arousal and orientation to time, place, and person were intact. Language including expression, naming, repetition, comprehension was assessed and found intact. Fund of Knowledge was assessed and was intact.  Cranial Nerves II - XII - II - inconsistent exam, questionable left hemianopia. Does not blink to threat bilaterally and poor effort on exam when testing EOM. But lateral gaze bilaterally  intact when I move to each side and ask him questions on the colors of my clothes which he answers correctly.  III, IV, VI - difficulty with bilateral lateral gaze, eyes mid position. Does not blink to threat  and poor effort on exam when testing EOM. But lateral gaze intact when I move to each side and ask him questions on the colors of my clothes which he answers correctly.  V - Facial sensation intact bilaterally. VII - Facial movement intact bilaterally. VIII - Hearing & vestibular intact bilaterally. X - Palate elevates symmetrically. XI - Chin turning & shoulder shrug intact bilaterally. XII - Tongue protrusion intact. Motor Strength - The patient's strength was 0/5 LUE and left leg stump, 5/5 RUE and right leg stump, bilateral AKA.  Bulk was normal and fasciculations were absent.   Motor Tone - Muscle tone was assessed at the neck and appendages and was normal.  Reflexes - The patient's reflexes were 1+ in UEs  Sensory - Light touch, temperature/pinprick were assessed and were symmetrical.    Coordination - The  patient had normal movements in the right hand with no ataxia or dysmetria.  Tremor was absent.  Gait and Station - not able to test.   ASSESSMENT/PLAN Ian Garcia is a 60 y.o. male with history of DM, seizures, htn, previous stroke, hld, and carotid stenosis presenting with acute onset of left-sided weakness. He did not receive IV t-PA due to late presentation.  Stroke:  Non-dominant infarct probably embolic from right carotid artery stenosis.  Resultant  Left hemiplegia  MRI  Acute posterior right MCA territory infarct with cytotoxic edema  MRA  No emergent large vessel occlusion to correspond with the acute posterior right MCA infarct.   Carotid Doppler - near occlusion of right carotid artery.   2D Echo - EF 35%. No PFO. No cardiac source of emboli identified.  LDL - 180  HgbA1c 12.6  VTE prophylaxis - subcutaneous heparin Diet Carb Modified Fluid consistency:: Thin; Room service appropriate?: Yes  clopidogrel 75 mg daily prior to admission, now on aspirin 325 mg daily and clopidogrel 75 mg daily. Continue DAPT.   Patient counseled to be compliant with his antithrombotic medications  Ongoing aggressive stroke risk factor management  Therapy recommendations: SNF recommended  Disposition: Pending  Right ICA near occlusion  Cause of right MCA infarct  Low BP on dopamine  TTE EF 35%  On IVF, BP goal 130-150  Vascular Surgery consult - 05/25/15 - CTA of Neck recommended when safe. ( Creat - 1.61 GFR - 46 ). Would not be a surgical candidate right away.  Appreciate Dr Adele Danickson's consult.  Hypotension   On IVF  Off dopamine  May consider midodrine and florinef if still low BP  EF 35%  CCM on board  Cardiomyopathy   EF 35%  Low BP  Appreciate cardiology recommendations:  Systolic CHF: LVEF 35%. Very poor candidate for invasive cardiology procedures. Has CKD. Creatinine mildly improved today to 1.6. Not able to add ACE-I/ARB due to this. On aspirin  and plavix. Start low dose coreg 3.125 mg BID as HR has improved up to the 70-80 range.  ? Seizure  Hx of seizure on keppra   Continue keppra 1000mg  bid  Reported event of left arm shaking over night. Received ativan  EEG showed right slowing, no seizure  Hyperlipidemia  Home meds:  Lipitor 80 mg daily resumed in hospital  LDL 180, goal < 70  Consider adding Zetia.  Continue statin at discharge  Diabetes  HgbA1c 12.6, goal < 7.0  Uncontrolled  Noncompliance  Tobacco abuse  Current smoker  Smoking cessation counseling provided  Pt is NOT willing to quit  Other Stroke Risk Factors  Hx  stroke/TIA  S/p left CEA  Other Active Problems  Elevated creatinine 1.86 -> 1.61  Hyperkalemia 5.2 - repeat in Saturday -> 3.7  Urine culture - 10,000 colonies ENTEROCOCCUS SPECIES  Blood cultures pending - no growth x 4 days  B/l AKA  Cardiomyopathy EF 35% - NSVT - Cardiology following - appreciate assistance.  Poor medical compliance  Repeat head CT today to f/u bleed - stable as noted above.  Delton See PA-C [facility] Pager [phone] 05/28/2015, 5:28 PM  Hospital day # 6   Continue present treatment. Await disposition to SNF for rehabilitation. Discussed with patient  Delia Heady, MD        To contact Stroke Continuity provider, please refer to [email or web address]. After hours, contact General Neurology

## 2015-05-28 NOTE — Progress Notes (Signed)
   VASCULAR SURGERY ASSESSMENT & PLAN:  * No improvement in left upper extremity weakness. Given that he has significant disease in the common carotid artery I would recommend a CT angiogram of the neck prior to considering right carotid endarterectomy. His creatinine has improved slightly. We will defer CT angiogram timing to primary service.  No complaints today.  PHYSICAL EXAM: Filed Vitals:   05/27/15 2123 05/28/15 0500 05/28/15 0518 05/28/15 0852  BP: 151/66  163/82 166/81  Pulse: 75  74 76  Temp: 98.9 F (37.2 C)  97.7 F (36.5 C)   TempSrc: Oral     Resp: 20  20   Height:      Weight:  177 lb (80.287 kg)    SpO2: 93%  93%    Flaccid left upper extremity  LABS: Lab Results  Component Value Date   WBC 8.3 05/24/2015   HGB 13.5 05/24/2015   HCT 41.6 05/24/2015   MCV 85.1 05/24/2015   PLT 242 05/24/2015   Lab Results  Component Value Date   CREATININE 1.56* 05/28/2015   Lab Results  Component Value Date   INR 1.02 11/19/2009   CBG (last 3)   Recent Labs  05/28/15 0355 05/28/15 0806 05/28/15 1211  GLUCAP 179*  214* 126* 144*    Principal Problem:   Acute ischemic stroke (HCC) Active Problems:   Type II diabetes mellitus with complication, uncontrolled (HCC)   Peripheral vascular disease due to secondary diabetes mellitus (HCC)   Seizure disorder (HCC)   CVA (cerebral infarction)   Acute kidney injury (HCC)   Below knee amputation status (HCC)   Tobacco abuse   Cerebral infarction due to unspecified mechanism   S/P AKA (above knee amputation) bilateral (HCC)   Cardiomyopathy (HCC)   Hypotension   Arterial hypotension   Right middle cerebral artery stroke (HCC)   Acute systolic CHF (congestive heart failure), NYHA class 3 (HCC)    Cari CarawayChris Neasia Fleeman Beeper: [phone]: [phone] 05/28/2015

## 2015-05-29 LAB — GLUCOSE, CAPILLARY
GLUCOSE-CAPILLARY: 144 mg/dL — AB (ref 65–99)
GLUCOSE-CAPILLARY: 169 mg/dL — AB (ref 65–99)
GLUCOSE-CAPILLARY: 174 mg/dL — AB (ref 65–99)
GLUCOSE-CAPILLARY: 197 mg/dL — AB (ref 65–99)
GLUCOSE-CAPILLARY: 217 mg/dL — AB (ref 65–99)
GLUCOSE-CAPILLARY: 248 mg/dL — AB (ref 65–99)
Glucose-Capillary: 244 mg/dL — ABNORMAL HIGH (ref 65–99)
Glucose-Capillary: 242 mg/dL — ABNORMAL HIGH (ref 65–99)

## 2015-05-29 LAB — CULTURE, BLOOD (ROUTINE X 2)
CULTURE: NO GROWTH
Culture: NO GROWTH

## 2015-05-29 LAB — BASIC METABOLIC PANEL
ANION GAP: 11 (ref 5–15)
BUN: 14 mg/dL (ref 6–20)
CALCIUM: 8.4 mg/dL — AB (ref 8.9–10.3)
CO2: 23 mmol/L (ref 22–32)
Chloride: 104 mmol/L (ref 101–111)
Creatinine, Ser: 1.67 mg/dL — ABNORMAL HIGH (ref 0.61–1.24)
GFR calc Af Amer: 51 mL/min — ABNORMAL LOW (ref >60)
GFR, EST NON AFRICAN AMERICAN: 44 mL/min — AB (ref >60)
Glucose, Bld: 161 mg/dL — ABNORMAL HIGH (ref 65–99)
POTASSIUM: 4.7 mmol/L (ref 3.5–5.1)
SODIUM: 138 mmol/L (ref 135–145)

## 2015-05-29 NOTE — Progress Notes (Signed)
Occupational Therapy Treatment Patient Details Name: Ian Garcia MRN: [id number] DOB: Dec 02, 1955 Today's Date: 05/29/2015    History of present illness Patient is a 60 yo male admitted 05/22/15 with Lt sided weakness and decreased sensation.  MRI showed acute Rt MCA infarct.  Patient also with thrombus in Rt ICA.    PMH:  DM, neuropathy, PVD, bilateral AKA's, HTN, seizure disorder, CVA, carotid stenosis, tobacco use, CHF with EF 35%   OT comments  Pt motivated and tries very hard.  Tended to push during session and had difficulty following directionality cues with multimodal cues for trunk.  He was able to reach with R hand to R but not forward.    Follow Up Recommendations  SNF    Equipment Recommendations  None recommended by OT    Recommendations for Other Services      Precautions / Restrictions Precautions Precautions: Fall Precaution Comments: pt with poor spatial awareness:  tends to push Restrictions Weight Bearing Restrictions: No       Mobility Bed Mobility     Rolling: Total assist;+2 for physical assistance Sidelying to sit: Total assist;+2 for physical assistance       General bed mobility comments: pt did reach for rail when he was within reach of this.  Had to cue him to release and reach for bed as he was pushing against us  Transfers                      Balance   Sitting-balance support: Single extremity supported Sitting balance-Leahy Scale: Zero                             ADL       Grooming: Brushing hair;Minimal assistance;Bed level (with cues to turn head to R to comb L side of hair)                                 General ADL Comments: Sat EOB with total A +2.  Pt had difficulty with directionality and pushed both to L and backwards.  Did not weight bear on RUE as IV was in forearm on palmar side.  Worked on scanning while sitting and reaching to try to initiate weight shifting.        Vision                     Perception     Praxis      Cognition   Behavior During Therapy: [facility] for tasks assessed/performed                    General Comments: pt with L neglect; cues to turn head to find objects    Extremity/Trunk Assessment               Exercises Other Exercises Other Exercises: PROM to LUE.  Pt unable to recruit movement. Had with edema and fingers tight into extension.  Worked on flexion. Encouraged pt to watch arm as well as hold wrist when repositioning him in bed.  Positioned LUE on pillows for edema management   Shoulder Instructions       General Comments      Pertinent Vitals/ Pain       Pain Assessment: No/denies pain  Home Living Family/patient expects to be discharged to:: Skilled nursing facility  Prior Functioning/Environment              Frequency Min 2X/week     Progress Toward Goals  OT Goals(current goals can now be found in the care plan section)  Progress towards OT goals: Progressing toward goals (slowly; added goals)  ADL Goals Additional ADL Goal #4: pt will initiate looking at LUE at least once a session without cues and he will wash hands/apply lotion with set up/min A  to open containters and wring out cloth  Plan      Co-evaluation                 End of Session     Activity Tolerance Patient tolerated treatment well   Patient Left in bed;with call bell/phone within reach;with bed alarm set   Nurse Communication          Time: 1610-9604 OT Time Calculation (min): 25 min  Charges: OT General Charges $OT Visit: 1 Procedure OT Treatments $Therapeutic Activity: 23-37 mins  Hawthorne Day 05/29/2015, 1:42 PM Marica Otter, OTR/L [phone] 05/29/2015

## 2015-05-29 NOTE — Progress Notes (Signed)
STROKE TEAM PROGRESS NOTE   HISTORY OF PRESENT ILLNESS Ian Garcia is an 60 y.o. 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  He was admitted to the hospitalist service at [facility]. As part of the stroke workup he had ultrasound of the carotids done which showed a near occlusive thrombus in the right ICA. MRI of the brain was performed which was of poor quality due to motion artifacts. It Did show a posterior parietal acute right MCA infarct.  Due to the abnormality seen on the carotid ultrasound with a near occlusive acute thrombus, he was transferred to [facility]  for further evaluation and management options including no any further interventions. His been transferred to the ICU. Patient is a poor historian. Reports living alone. No family available at bedside.  Date last known well: 05/21/2015 Time last known well: Unknown tPA Given: No: Outside time window   SUBJECTIVE (INTERVAL HISTORY) No family members present. The patient voices no new complaints. He remains hemiplegic on left. Vacular surgery wants CTA neck to further evaluate carotid stenosis    OBJECTIVE Temp:  [98.1 F (36.7 C)-99.1 F (37.3 C)] 98.3 F (36.8 C) (03/22 1325) Pulse Rate:  [58-80] 80 (03/22 1325) Cardiac Rhythm:  [-] Normal sinus rhythm (03/22 0732) Resp:  [17-18] 18 (03/22 1325) BP: (127-163)/(68-80) 132/74 mmHg (03/22 1325) SpO2:  [90 %-95 %] 94 % (03/22 1325) Weight:  [174 lb 6.1 oz (79.1 kg)] 174 lb 6.1 oz (79.1 kg) (03/22 0521)  CBC:    Recent Labs Lab 05/22/15 1943 05/23/15 0917 05/24/15 0515  WBC 10.6* 8.3 8.3  NEUTROABS 7.6  --   --   HGB 15.1 13.4 13.5  HCT 45.9 41.8 41.6  MCV 81.8 83.1 85.1  PLT 330 278 242    Basic Metabolic Panel:   Recent Labs Lab 05/24/15 0927  05/27/15 1935 05/28/15 0535 05/29/15 0539  NA  --   < > 138 140 138  K  --   < > 4.4 4.1 4.7  CL  --   < > 108 108 104  CO2  --   < > 22 23 23   GLUCOSE  --   < > 272* 185* 161*  BUN  --   < > 12 11 14   CREATININE  --   < > 1.72* 1.56* 1.67*  CALCIUM  --   < > 7.9* 8.0* 8.4*  MG 1.8  --  1.6*  --   --   PHOS 2.6  --   --   --   --   < > = values in this interval not displayed.  Lipid Panel:     Component Value Date/Time   CHOL 264* 05/24/2015 0357   TRIG 271* 05/24/2015 0357   HDL 30* 05/24/2015 0357   CHOLHDL 8.8 05/24/2015 0357   VLDL 54* 05/24/2015 0357   LDLCALC 180* 05/24/2015 0357   HgbA1c:  Lab Results  Component Value Date   HGBA1C 12.8* 05/24/2015   Urine Drug Screen: No results found for: LABOPIA, COCAINSCRNUR, LABBENZ, AMPHETMU, THCU, LABBARB    IMAGING I have personally reviewed the radiological images below and agree with the radiology interpretations.  CT Head Wo Contrast 05/22/2015   1. Subacute nonhemorrhagic infarction high in the right parietal lobe with secondary edema of that portion of the brain without midline shift.  2. Multiple small scalp contusions.    CT head without contrast 05/28/2015 Continued normal expected interval evolution of moderate sized right MCA infarct.  No hemorrhagic transformation or other complication identified.  No other new intracranial process.    Mr Ian Garcia Head Wo Contrast 05/23/2015   Motion degraded. No emergent large vessel occlusion to correspond with the acute posterior right MCA infarct.   Mr Brain Wo Contrast 05/23/2015   1. Acute posterior right MCA territory infarct corresponding to the abnormal CT appearance yesterday. Cytotoxic edema without  intracranial mass effect at this time.  2. Intracranial MRA from today reported separately.  3. Moderately to severely motion degraded and the examination had to be discontinued prior to completion due to patient agitation.  4. Consider surveillance for associated intracranial hemorrhage and mass effect with noncontrast CT. (see above)  US Renal 05/23/2015   1. No hydronephrosis.  2. Echogenic normal size kidneys, indicating nonspecific renal parenchymal disease of uncertain chronicity.  3. Mild diffuse bladder wall thickening and trabeculation, suggesting chronic bladder voiding dysfunction such as due to chronic bladder outlet obstruction by an enlarged prostate gland. The prostate gland is not evaluated on this scan.   US Carotid Bilateral 05/23/2015   Right: Nearly occlusive centralized thrombus/plaque within the right common carotid artery which contributes to post stenotic and decreased antegrade flow of the right ICA. This plaque is favored to represent acute embolism/thrombus given the morphology. Formal cerebral angiogram is recommended. As a second best test, CTA. Left: The patient is status post left carotid endarterectomy in September 2011, with atherosclerotic changes of the common carotid artery. Established duplex criteria have not been validated for assessment after surgery, however, there is no evidence of compromised flow. High diastolic component of the flow on the left suggests increase contribution, potentially to the right hemisphere given the small infarction on recent CT.   EEG - This is an abnormal EEG demonstrating posterior slowing on the right compared to that of the left, consistent with the patient's history of a new right PCA infarct. There was no epileptiform activity recorded on this recording.  2D echo - Upper normal LV chamber size with mild LVH and LVEF approximately  35%. There is diffuse hypokinesis with akinesis of the basal  inferolateral/inferior wall.  Grade 1 diastolic dysfunction with  indeterminate filling pressure. Upper normal left atrial chamber  size. Trivial mitral regurgitation. No obvious PFO or ASD.   PHYSICAL EXAM  Temp:  [98.1 F (36.7 C)-99.1 F (37.3 C)] 98.3 F (36.8 C) (03/22 1325) Pulse Rate:  [58-80] 80 (03/22 1325) Resp:  [17-18] 18 (03/22 1325) BP: (127-163)/(68-80) 132/74 mmHg (03/22 1325) SpO2:  [90 %-95 %] 94 % (03/22 1325) Weight:  [174 lb 6.1 oz (79.1 kg)] 174 lb 6.1 oz (79.1 kg) (03/22 0521)  General - cachetic looking, well developed, in no apparent distress.  Ophthalmologic - Fundi not visualized due to noncooperation.  Cardiovascular - Regular rate and rhythm.  Mental Status -  Level of arousal and orientation to time, place, and person were intact. Language including expression, naming, repetition, comprehension was assessed and found intact. Fund of Knowledge was assessed and was intact.  Cranial Nerves II - XII - II - inconsistent exam, questionable left hemianopia. Does not blink to threat bilaterally and poor effort on exam when testing EOM. But  lateral gaze bilaterally  intact when I move to each side and ask him questions on the colors of my clothes which he answers correctly.  III, IV, VI - difficulty with bilateral lateral gaze, eyes mid position. Does not blink to threat and poor effort on exam when testing EOM. But lateral gaze intact when I move to each side and ask him questions on the colors of my clothes which he answers correctly.  V - Facial sensation intact bilaterally. VII - Facial movement intact bilaterally. VIII - Hearing & vestibular intact bilaterally. X - Palate elevates symmetrically. XI - Chin turning & shoulder shrug intact bilaterally. XII - Tongue protrusion intact. Motor Strength - The patient's strength was 0/5 LUE and left leg stump, 5/5 RUE and right leg stump, bilateral AKA.  Bulk was normal and fasciculations were absent.   Motor Tone - Muscle tone was  assessed at the neck and appendages and was normal.  Reflexes - The patient's reflexes were 1+ in UEs  Sensory - Light touch, temperature/pinprick were assessed and were symmetrical.    Coordination - The patient had normal movements in the right hand with no ataxia or dysmetria.  Tremor was absent.  Gait and Station - not able to test.   ASSESSMENT/PLAN Ian Garcia is a 60 y.o. male with history of DM, seizures, htn, previous stroke, hld, and carotid stenosis presenting with acute onset of left-sided weakness. He did not receive IV t-PA due to late presentation.  Stroke:  Non-dominant infarct probably embolic from right carotid artery stenosis.  Resultant  Left hemiplegia  MRI  Acute posterior right MCA territory infarct with cytotoxic edema  MRA  No emergent large vessel occlusion to correspond with the acute posterior right MCA infarct.   Carotid Doppler - near occlusion of right carotid artery.   2D Echo - EF 35%. No PFO. No cardiac source of emboli identified.  LDL - 180  HgbA1c 12.6  VTE prophylaxis - subcutaneous heparin Diet Carb Modified Fluid consistency:: Thin; Room service appropriate?: Yes  clopidogrel 75 mg daily prior to admission, now on aspirin 325 mg daily and clopidogrel 75 mg daily. Continue DAPT.   Patient counseled to be compliant with his antithrombotic medications  Ongoing aggressive stroke risk factor management  Therapy recommendations: SNF recommended  Disposition: Pending  Right ICA near occlusion  Cause of right MCA infarct  Low BP on dopamine  TTE EF 35%  On IVF, BP goal 130-150  Vascular Surgery consult - 05/25/15 - CTA of Neck recommended when safe. ( Creat - 1.61 GFR - 46 ). Would not be a surgical candidate right away.  Appreciate Dr Adele Dan consult.  Hypotension   On IVF  Off dopamine  May consider midodrine and florinef if still low BP  EF 35%  CCM on board  Cardiomyopathy   EF 35%  Low BP  Appreciate  cardiology recommendations:  Systolic CHF: LVEF 35%. Very poor candidate for invasive cardiology procedures. Has CKD. Creatinine mildly improved today to 1.6. Not able to add ACE-I/ARB due to this. On aspirin and plavix. Start low dose coreg 3.125 mg BID as HR has improved up to the 70-80 range.  ? Seizure  Hx of seizure on keppra   Continue keppra  bid  Reported event of left arm shaking over night. Received ativan  EEG showed right slowing, no seizure  Hyperlipidemia  Home meds:  Lipitor 80 mg daily resumed in hospital  LDL 180, goal < 70  Consider adding  Zetia.  Continue statin at discharge  Diabetes  HgbA1c 12.6, goal < 7.0  Uncontrolled  Noncompliance  Tobacco abuse  Current smoker  Smoking cessation counseling provided  Pt is NOT willing to quit  Other Stroke Risk Factors  Hx stroke/TIA  S/p left CEA  Other Active Problems  Elevated creatinine 1.86 -> 1.61  Hyperkalemia 5.2 - repeat in Saturday -> 3.7  Urine culture - 10,000 colonies ENTEROCOCCUS SPECIES  Blood cultures pending - no growth x 4 days  B/l AKA  Cardiomyopathy EF 35% - NSVT - Cardiology following - appreciate assistance.  Poor medical compliance Order CTA neck only today  Hospital day # 7   Check CTA neck. Will hydrate patient. Await disposition to SNF for rehabilitation. Discussed with patient  Delia Heady, MD        To contact Stroke Continuity provider, please refer to [email or web address]. After hours, contact General Neurology

## 2015-05-29 NOTE — Progress Notes (Signed)
VASCULAR SURGERY:  Would obtain a CTA of the neck during this admission if the primary service feels that it is safe. His creatinine is 1.67. GFR is 44. The duplex suggested significant disease in the common carotid artery which I would need to assess prior to considering right carotid endarterectomy. If his disease appears to be surgically accessible I would favor proceeding with right carotid endarterectomy in a proximally 2 weeks, if neurology agrees. This can be arranged after he has been discharged to the skilled nursing facility and I can schedule follow up in my office to arrange surgery.  Waverly Ferrarihristopher Secundino Ellithorpe, MD, FACS Beeper [phone] Office: [phone]

## 2015-05-29 NOTE — Progress Notes (Signed)
Physical Therapy Treatment Patient Details Name: Ian Garcia MRN: [id number] DOB: 1955-05-21 Today's Date: 05/29/2015    History of Present Illness Patient is a 60 yo male admitted 05/22/15 with Lt sided weakness and decreased sensation.  MRI showed acute Rt MCA infarct.  Patient also with thrombus in Rt ICA.    PMH:  DM, neuropathy, PVD, bilateral AKA's, HTN, seizure disorder, CVA, carotid stenosis, tobacco use, CHF with EF 35%    PT Comments    Pt performed supine to sit x 2 trials,  Multiple bouts of rolling and therapeutic exercise.  Pt remains to require total assist +2 for bed mobility and sitting balance remains zero.    Follow Up Recommendations  SNF;Supervision/Assistance - 24 hour     Equipment Recommendations  None recommended by PT    Recommendations for Other Services       Precautions / Restrictions Precautions Precautions: Fall Precaution Comments: pt with poor spatial awareness:  tends to push Restrictions Weight Bearing Restrictions: No    Mobility  Bed Mobility Overal bed mobility: Needs Assistance;+2 for physical assistance Bed Mobility: Rolling;Supine to Sit;Sit to Supine Rolling: Max assist;Total assist (Pt required max assist to roll to L side and total assist to roll to R.  ) Sidelying to sit: Total assist;+2 for physical assistance Supine to sit: Total assist;+2 for physical assistance Sit to supine: Total assist;+2 for physical assistance   General bed mobility comments: Pt able to reach for rail to assist with mobility but required significant assist to  achieve sitting and to maintain sitting.  Pt required total +2 for scooting in supine as well.  Post transfer back to bed pt positioned to promote neutral alignment of upper trunk and head.  Pt required VCs to adjust posturing with head.    Transfers Overall transfer level:  (remains unsafe due to heavy assist to sit edge of bed requiring max assist +2, total assist +2.  )                   Ambulation/Gait                 Stairs            Wheelchair Mobility    Modified Rankin (Stroke Patients Only)       Balance   Sitting-balance support: Single extremity supported Sitting balance-Leahy Scale: Zero Sitting balance - Comments: Pt able to reach for rail to adjust posture on R, pt assisted to prop on RUE elbow to promote weight shifting to R.  Pt unable to sit unassisted.   Postural control: Left lateral lean                          Cognition Arousal/Alertness: Awake/alert Behavior During Therapy: WFL for tasks assessed/performed Overall Cognitive Status: Impaired/Different from baseline (reports seeing a man he used to go to school with by the name of Dorene SorrowJerry.  ) Area of Impairment: Safety/judgement;Problem solving         Safety/Judgement: Decreased awareness of deficits;Decreased awareness of safety   Problem Solving: Slow processing;Decreased initiation;Difficulty sequencing;Requires verbal cues;Requires tactile cues General Comments: pt with L neglect; cues to turn head to find objects    Exercises General Exercises - Lower Extremity Hip ABduction/ADduction: Both;10 reps (x2 sets, required resisted ROM to R and PROM to L.) Straight Leg Raises: Both;10 reps (x2 sets, required resisted ROM to R and PROM to L.  ) Other Exercises Other Exercises:  partial sit ups in supine with RUE to pull 2x10 reps.      General Comments        Pertinent Vitals/Pain Pain Assessment: No/denies pain    Home Living Family/patient expects to be discharged to:: Skilled nursing facility                    Prior Function            PT Goals (current goals can now be found in the care plan section) Acute Rehab PT Goals Patient Stated Goal: to get up in recliner Potential to Achieve Goals: Fair Progress towards PT goals: Progressing toward goals    Frequency  Min 3X/week    PT Plan      Co-evaluation             End of  Session   Activity Tolerance: Patient limited by lethargy;Patient limited by fatigue Patient left: in bed;with call bell/phone within reach;with bed alarm set     Time: 1308-6578 PT Time Calculation (min) (ACUTE ONLY): 45 min  Charges:  $Therapeutic Exercise: 8-22 mins $Therapeutic Activity: 23-37 mins                    G Codes:      Florestine Avers 2015-06-28, 1:56 PM Joycelyn Rua, PTA pager [phone]

## 2015-05-30 ENCOUNTER — Inpatient Hospital Stay (HOSPITAL_COMMUNITY): Payer: Non-veteran care

## 2015-05-30 LAB — GLUCOSE, CAPILLARY
GLUCOSE-CAPILLARY: 277 mg/dL — AB (ref 65–99)
Glucose-Capillary: 197 mg/dL — ABNORMAL HIGH (ref 65–99)
Glucose-Capillary: 155 mg/dL — ABNORMAL HIGH (ref 65–99)
Glucose-Capillary: 276 mg/dL — ABNORMAL HIGH (ref 65–99)
Glucose-Capillary: 227 mg/dL — ABNORMAL HIGH (ref 65–99)

## 2015-05-30 LAB — BASIC METABOLIC PANEL
Anion gap: 8 (ref 5–15)
BUN: 10 mg/dL (ref 6–20)
CALCIUM: 8.1 mg/dL — AB (ref 8.9–10.3)
CHLORIDE: 103 mmol/L (ref 101–111)
CO2: 27 mmol/L (ref 22–32)
CREATININE: 1.51 mg/dL — AB (ref 0.61–1.24)
GFR calc Af Amer: 57 mL/min — ABNORMAL LOW (ref >60)
GFR calc non Af Amer: 49 mL/min — ABNORMAL LOW (ref >60)
Glucose, Bld: 178 mg/dL — ABNORMAL HIGH (ref 65–99)
Potassium: 4.1 mmol/L (ref 3.5–5.1)
SODIUM: 138 mmol/L (ref 135–145)

## 2015-05-30 MED ORDER — IOHEXOL 350 MG/ML SOLN: 50.0000 mL | Freq: Once | INTRAVENOUS | Status: DC | PRN

## 2015-05-30 MED ORDER — FUROSEMIDE 10 MG/ML IJ SOLN
40.0000 mg | Freq: Two times a day (BID) | INTRAMUSCULAR | Status: DC
  Administered 2015-05-30 – 2015-06-03 (×7): 40 mg via INTRAVENOUS
  Filled 2015-05-30 (×8): qty 4

## 2015-05-30 NOTE — Progress Notes (Addendum)
   VASCULAR SURGERY ASSESSMENT & PLAN:  * Patient had CTA today which I have reviewed. He has a string sign in the mid common carotid artery with some adherent thrombus above that. There is some mild disease in the internal carotid artery. However in addition, he has diffuse disease of his common carotid artery with a tight stenosis in the proximal common carotid artery. I have discussed the case with Dr. Pearlean BrownieSethi and we agreed that the patient is at high-risk for occlusion of the right carotid system which could be associated with significant morbidity or mortality. For this reason, I have recommended that we proceed with addressing the disease in the right common carotid artery. Given the diffuse disease I do not think this could ideally be addressed with a retrograde carotid stent. I think the best option will probably be a right subclavian artery to carotid bypass with carotid endarterectomy. He appears to have reasonable inflow in the right arm.  *  I have discussed this with the patient. We have reviewed the indications for the procedure including the risk of recurrent stroke or mortality if the artery occludes. In addition, I have discussed the risks of surgery, including but not limited to, 2-3% risk of stroke, wound healing problems, bleeding, and cardiac complications. He is agreeable to proceed.  * I tried to call his sister Ian Garcia [phone]) and left a message.  SUBJECTIVE: No specific complaints.  PHYSICAL EXAM: Filed Vitals:   05/30/15 0204 05/30/15 0540 05/30/15 0926 05/30/15 0931  BP: 152/68 153/71 173/95 165/85  Pulse: 75 72 84 80  Temp: 97.7 F (36.5 C) 98.4 F (36.9 C)    TempSrc: Oral Oral    Resp: 18 18    Height:      Weight:  174 lb 9.7 oz (79.2 kg)    SpO2: 91% 96%     Persistent paralysis left upper extremity. Blood pressure in the left arm is 173/95. The pressure in the right arm is 165/85.  LABS: Lab Results  Component Value Date   WBC 8.3 05/24/2015   HGB  13.5 05/24/2015   HCT 41.6 05/24/2015   MCV 85.1 05/24/2015   PLT 242 05/24/2015   Lab Results  Component Value Date   CREATININE 1.51* 05/30/2015   Lab Results  Component Value Date   INR 1.02 11/19/2009   CBG (last 3)   Recent Labs  05/29/15 2357 05/30/15 0513 05/30/15 0818  GLUCAP 174* 197* 155*    Principal Problem:   Acute ischemic stroke (HCC) Active Problems:   Type II diabetes mellitus with complication, uncontrolled (HCC)   Peripheral vascular disease due to secondary diabetes mellitus (HCC)   Seizure disorder (HCC)   CVA (cerebral infarction)   Acute kidney injury (HCC)   Below knee amputation status (HCC)   Tobacco abuse   Cerebral infarction due to unspecified mechanism   S/P AKA (above knee amputation) bilateral (HCC)   Cardiomyopathy (HCC)   Hypotension   Arterial hypotension   Right middle cerebral artery stroke (HCC)   Acute systolic CHF (congestive heart failure), NYHA class 3 (HCC)    Ian Garcia Beeper: [phone]: [phone] 05/30/2015

## 2015-05-30 NOTE — Care Management Note (Signed)
Case Management Note  Patient Details  Name: Nuala AlphaBruce L Knezevic MRN: [id number] Date of Birth: 11/07/1955  Subjective/Objective:    60 y.o. M admitted 05/22/2015  Acute LSW. During that time he underwent Carotid Dopplers which revealed Carotid Stenosis. Currently being evaluated for Procedure.                Action/Plan: Anticipate discharge to SNF at time of discharge. No further CM needs but will be available should additional discharge needs arise.   Expected Discharge Date:                  Expected Discharge Plan:  Home w Home Health Services  In-House Referral:     Discharge planning Services  CM Consult  Post Acute Care Choice:    Choice offered to:     DME Arranged:    DME Agency:     HH Arranged:    HH Agency:     Status of Service:  In process, will continue to follow  Medicare Important Message Given:    Date Medicare IM Given:    Medicare IM give by:    Date Additional Medicare IM Given:    Additional Medicare Important Message give by:     If discussed at Long Length of Stay Meetings, dates discussed:    Additional Comments:  Yvone NeuCrutchfield, Abdulahi Schor M, RN 05/30/2015, 2:37 PM

## 2015-05-30 NOTE — Progress Notes (Signed)
Speech Language Pathology Treatment: Cognitive-Linquistic  Patient Details Name: Ian Garcia MRN: [id number] DOB: 10-06-1955 Today's Date: 05/30/2015 Time: [id number] SLP Time Calculation (min) (ACUTE ONLY): 15 min  Assessment / Plan / Recommendation Clinical Impression  Pt shows intellectual awareness of left inattention, stating "everyone keeps telling me to look to the left". During structured table task he needs Mod-Max cues to identify all items on his left side. Question if additional visual deficits may be impacting performance as well given some difficulty even with items on the right side of his page. Attempted clock drawing with emergent awareness of errors, although he was not able to self-correct. Will continue to follow.   HPI HPI: 60 y.o. male with h/o DM, diabetic neuropathy, seizures, HTN, acid reflux, hypercholesteremia, peripheral vascular disease due to secondary DM, CVA, carotid stenosis s/p CEA on L and tobacco abuse, who presented to ED with acute L side weakness since 3/14. MR Brain 3/16 acute posterior R MCA territory infarct. MRA Head 3/16 no emergent large vessel occlusion to correspond with acute posterior R MCA infarct.      SLP Plan  Continue with current plan of care     Recommendations                Follow up Recommendations: Skilled Nursing facility Plan: Continue with current plan of care     GO               Ian Garcia, M.A. CCC-SLP [phone]  Ian Garcia, Ian Garcia 05/30/2015, 3:43 PM

## 2015-05-30 NOTE — Progress Notes (Signed)
STROKE TEAM PROGRESS NOTE   HISTORY OF PRESENT ILLNESS Ian Garcia is an 60 y.o. 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  He was admitted to the hospitalist service at [facility]. As part of the stroke workup he had ultrasound of the carotids done which showed a near occlusive thrombus in the right ICA. MRI of the brain was performed which was of poor quality due to motion artifacts. It Did show a posterior parietal acute right MCA infarct.  Due to the abnormality seen on the carotid ultrasound with a near occlusive acute thrombus, he was transferred to [facility] for further evaluation and management options including no any further interventions. His been transferred to the ICU. Patient is a poor historian. Reports living alone. No family available at bedside.  Date last known well: 05/21/2015 Time last known well: Unknown tPA Given: No: Outside time window   SUBJECTIVE (INTERVAL HISTORY) No family members present. The patient voices no new complaints. He remains hemiplegic on left.  CTA neck shows string sign with nonocclusive clot in right ICA    OBJECTIVE Temp:  [97.7 F (36.5 C)-98.8 F (37.1 C)] 98.8 F (37.1 C) (03/23 1349) Pulse Rate:  [65-86] 66 (03/23 1349) Cardiac Rhythm:  [-] Normal sinus rhythm (03/23 0700) Resp:  [18-20] 20 (03/23 1349) BP: (130-173)/(68-95) 131/68 mmHg (03/23 1349) SpO2:  [91 %-96 %] 91 % (03/23 1349) Weight:  [174 lb 9.7 oz (79.2 kg)] 174 lb 9.7 oz (79.2 kg) (03/23 0540)  CBC:   Recent  Labs Lab 05/24/15 0515  WBC 8.3  HGB 13.5  HCT 41.6  MCV 85.1  PLT 242    Basic Metabolic Panel:   Recent Labs Lab 05/24/15 0927  05/27/15 1935  05/29/15 0539 05/30/15 0517  NA  --   < > 138  < > 138 138  K  --   < > 4.4  < > 4.7 4.1  CL  --   < > 108  < > 104 103  CO2  --   < > 22  < > 23 27  GLUCOSE  --   < > 272*  < > 161* 178*  BUN  --   < > 12  < > 14 10  CREATININE  --   < > 1.72*  < > 1.67* 1.51*  CALCIUM  --   < > 7.9*  < > 8.4* 8.1*  MG 1.8  --  1.6*  --   --   --   PHOS 2.6  --   --   --   --   --   < > = values in this interval not displayed.  Lipid Panel:     Component Value Date/Time   CHOL 264* 05/24/2015 0357   TRIG 271* 05/24/2015 0357   HDL 30* 05/24/2015 0357   CHOLHDL 8.8 05/24/2015 0357   VLDL 54* 05/24/2015 0357   LDLCALC 180* 05/24/2015 0357   HgbA1c:  Lab Results  Component Value Date   HGBA1C 12.8* 05/24/2015   Urine Drug Screen: No results found for: LABOPIA, COCAINSCRNUR, LABBENZ, AMPHETMU, THCU, LABBARB    IMAGING I have personally reviewed the  radiological images below and agree with the radiology interpretations.  CT Head Wo Contrast 05/22/2015   1. Subacute nonhemorrhagic infarction high in the right parietal lobe with secondary edema of that portion of the brain without midline shift.  2. Multiple small scalp contusions.    CT head without contrast 05/28/2015 Continued normal expected interval evolution of moderate sized right MCA infarct.  No hemorrhagic transformation or other complication identified.  No other new intracranial process.    Mr Ian Garcia Head Wo Contrast 05/23/2015   Motion degraded. No emergent large vessel occlusion to correspond with the acute posterior right MCA infarct.   Mr Brain Wo Contrast 05/23/2015   1. Acute posterior right MCA territory infarct corresponding to the abnormal CT appearance yesterday. Cytotoxic edema without intracranial mass effect at this time.  2. Intracranial MRA from today  reported separately.  3. Moderately to severely motion degraded and the examination had to be discontinued prior to completion due to patient agitation.  4. Consider surveillance for associated intracranial hemorrhage and mass effect with noncontrast CT. (see above)  US Renal 05/23/2015   1. No hydronephrosis.  2. Echogenic normal size kidneys, indicating nonspecific renal parenchymal disease of uncertain chronicity.  3. Mild diffuse bladder wall thickening and trabeculation, suggesting chronic bladder voiding dysfunction such as due to chronic bladder outlet obstruction by an enlarged prostate gland. The prostate gland is not evaluated on this scan.   US Carotid Bilateral 05/23/2015   Right: Nearly occlusive centralized thrombus/plaque within the right common carotid artery which contributes to post stenotic and decreased antegrade flow of the right ICA. This plaque is favored to represent acute embolism/thrombus given the morphology. Formal cerebral angiogram is recommended. As a second best test, CTA. Left: The patient is status post left carotid endarterectomy in September 2011, with atherosclerotic changes of the common carotid artery. Established duplex criteria have not been validated for assessment after surgery, however, there is no evidence of compromised flow. High diastolic component of the flow on the left suggests increase contribution, potentially to the right hemisphere given the small infarction on recent CT.   EEG - This is an abnormal EEG demonstrating posterior slowing on the right compared to that of the left, consistent with the patient's history of a new right PCA infarct. There was no epileptiform activity recorded on this recording.  2D echo - Upper normal LV chamber size with mild LVH and LVEF approximately  35%. There is diffuse hypokinesis with akinesis of the basal  inferolateral/inferior wall. Grade 1 diastolic dysfunction with  indeterminate filling pressure. Upper  normal left atrial chamber  size. Trivial mitral regurgitation. No obvious PFO or ASD.   PHYSICAL EXAM  Temp:  [97.7 F (36.5 C)-98.8 F (37.1 C)] 98.8 F (37.1 C) (03/23 1349) Pulse Rate:  [65-86] 66 (03/23 1349) Resp:  [18-20] 20 (03/23 1349) BP: (130-173)/(68-95) 131/68 mmHg (03/23 1349) SpO2:  [91 %-96 %] 91 % (03/23 1349) Weight:  [174 lb 9.7 oz (79.2 kg)] 174 lb 9.7 oz (79.2 kg) (03/23 0540)  General - cachetic looking, well developed, in no apparent distress.  Ophthalmologic - Fundi not visualized due to noncooperation.  Cardiovascular - Regular rate and rhythm.  Mental Status -  Level of arousal and orientation to time, place, and person were intact. Language including expression, naming, repetition, comprehension was assessed and found intact. Fund of Knowledge was assessed and was intact.  Cranial Nerves II - XII - II - inconsistent exam, questionable left hemianopia. Does not blink to threat  bilaterally and poor effort on exam when testing EOM. But lateral gaze bilaterally  intact when I move to each side and ask him questions on the colors of my clothes which he answers correctly.  III, IV, VI - difficulty with bilateral lateral gaze, eyes mid position. Does not blink to threat and poor effort on exam when testing EOM. But lateral gaze intact when I move to each side and ask him questions on the colors of my clothes which he answers correctly.  V - Facial sensation intact bilaterally. VII - Facial movement intact bilaterally. VIII - Hearing & vestibular intact bilaterally. X - Palate elevates symmetrically. XI - Chin turning & shoulder shrug intact bilaterally. XII - Tongue protrusion intact. Motor Strength - The patient's strength was 0/5 LUE and left leg stump, 5/5 RUE and right leg stump, bilateral AKA.  Bulk was normal and fasciculations were absent.   Motor Tone - Muscle tone was assessed at the neck and appendages and was normal.  Reflexes - The patient's  reflexes were 1+ in UEs  Sensory - Light touch, temperature/pinprick were assessed and were symmetrical.    Coordination - The patient had normal movements in the right hand with no ataxia or dysmetria.  Tremor was absent.  Gait and Station - not able to test.   ASSESSMENT/PLAN Mr. Ian Garcia is a 60 y.o. male with history of DM, seizures, htn, previous stroke, hld, and carotid stenosis presenting with acute onset of left-sided weakness. He did not receive IV t-PA due to late presentation.  Stroke:  Non-dominant infarct probably embolic from right carotid artery stenosis.  Resultant  Left hemiplegia  MRI  Acute posterior right MCA territory infarct with cytotoxic edema  MRA  No emergent large vessel occlusion to correspond with the acute posterior right MCA infarct.   Carotid Doppler - near occlusion of right carotid artery.   2D Echo - EF 35%. No PFO. No cardiac source of emboli identified.  LDL - 180  HgbA1c 12.6  VTE prophylaxis - subcutaneous heparin Diet Carb Modified Fluid consistency:: Thin; Room service appropriate?: Yes  clopidogrel 75 mg daily prior to admission, now on aspirin 325 mg daily and clopidogrel 75 mg daily. Continue DAPT.   Patient counseled to be compliant with his antithrombotic medications  Ongoing aggressive stroke risk factor management  Therapy recommendations: SNF recommended  Disposition: Pending  Right ICA near occlusion  Cause of right MCA infarct  Low BP on dopamine  TTE EF 35%  On IVF, BP goal 130-150  Vascular Surgery consult - 05/25/15 - CTA of Neck recommended when safe. ( Creat - 1.61 GFR - 46 ). Would not be a surgical candidate right away.  Appreciate Dr Adele Danickson's consult.  Hypotension   On IVF  Off dopamine  May consider midodrine and florinef if still low BP  EF 35%  CCM on board  Cardiomyopathy   EF 35%  Low BP  Appreciate cardiology recommendations:  Systolic CHF: LVEF 35%. Very poor candidate for  invasive cardiology procedures. Has CKD. Creatinine mildly improved today to 1.6. Not able to add ACE-I/ARB due to this. On aspirin and plavix. Start low dose coreg 3.125 mg BID as HR has improved up to the 70-80 range.  ? Seizure  Hx of seizure on keppra   Continue keppra 1000mg  bid  Reported event of left arm shaking over night. Received ativan  EEG showed right slowing, no seizure  Hyperlipidemia  Home meds:  Lipitor 80 mg daily resumed in  hospital  LDL 180, goal < 70  Consider adding Zetia.  Continue statin at discharge  Diabetes  HgbA1c 12.6, goal < 7.0  Uncontrolled  Noncompliance  Tobacco abuse  Current smoker  Smoking cessation counseling provided  Pt is NOT willing to quit  Other Stroke Risk Factors  Hx stroke/TIA  S/p left CEA  Other Active Problems  Elevated creatinine 1.86 -> 1.61  Hyperkalemia 5.2 - repeat in Saturday -> 3.7  Urine culture - 10,000 colonies ENTEROCOCCUS SPECIES  Blood cultures pending - no growth x 4 days  B/l AKA  Cardiomyopathy EF 35% - NSVT - Cardiology following - appreciate assistance.  Poor medical compliance Order CTA neck only today  Hospital day # 8   Discussed CTA neck with Dr Edilia Bo and feel he nees emergent revasularization rather than waiting 2 weeks due to incease risk of stroke from clot and risk of complete occlusion given string sign.Plan surgery tomorrow  Discussed with patient and Dr Durwin Nora  Delia Heady, MD        To contact Stroke Continuity provider, please refer to [email or web address]. After hours, contact General Neurology

## 2015-05-30 NOTE — Progress Notes (Signed)
Subjective: Breathing is OK  No CP   Objective: Filed Vitals:   05/30/15 0926 05/30/15 0931 05/30/15 1048 05/30/15 1349  BP: 173/95 165/85 160/73 131/68  Pulse: 84 80 86 66  Temp:   97.9 F (36.6 C) 98.8 F (37.1 C)  TempSrc:   Oral Oral  Resp:   18 20  Height:      Weight:      SpO2:   95% 91%   Weight change: 3.5 oz (0.1 kg)  Intake/Output Summary (Last 24 hours) at 05/30/15 1512 Last data filed at 05/30/15 [id number]  Gross per 24 hour  Intake 661.25 ml  Output      0 ml  Net 661.25 ml  Lasix  Incomp    General: Alert, awake, oriented x3, in no acute distress  Comfortable   Heart: Regular rate and rhythm, without murmurs, rubs, gallops.  Lungs: Clear to auscultation.  No rales or wheezes. Exemities: s/p bilateral AKA Neuro: Grossly intact, nonfocal.  Tele  SR   Lab Results: Results for orders placed or performed during the hospital encounter of 05/22/15 (from the past 24 hour(s))  Glucose, capillary     Status: Abnormal   Collection Time: 05/29/15  5:01 PM  Result Value Ref Range   Glucose-Capillary 217 (H) 65 - 99 mg/dL  Glucose, capillary     Status: Abnormal   Collection Time: 05/29/15  8:12 PM  Result Value Ref Range   Glucose-Capillary 197 (H) 65 - 99 mg/dL  Glucose, capillary     Status: Abnormal   Collection Time: 05/29/15 11:57 PM  Result Value Ref Range   Glucose-Capillary 174 (H) 65 - 99 mg/dL  Glucose, capillary     Status: Abnormal   Collection Time: 05/30/15  5:13 AM  Result Value Ref Range   Glucose-Capillary 197 (H) 65 - 99 mg/dL  Basic metabolic panel     Status: Abnormal   Collection Time: 05/30/15  5:17 AM  Result Value Ref Range   Sodium 138 135 - 145 mmol/L   Potassium 4.1 3.5 - 5.1 mmol/L   Chloride 103 101 - 111 mmol/L   CO2 27 22 - 32 mmol/L   Glucose, Bld 178 (H) 65 - 99 mg/dL   BUN 10 6 - 20 mg/dL   Creatinine, Ser 9.601.51 (H) 0.61 - 1.24 mg/dL   Calcium 8.1 (L) 8.9 - 10.3 mg/dL   GFR calc non Af Amer 49 (L) >60 mL/min   GFR calc  Af Amer 57 (L) >60 mL/min   Anion gap 8 5 - 15  Glucose, capillary     Status: Abnormal   Collection Time: 05/30/15  8:18 AM  Result Value Ref Range   Glucose-Capillary 155 (H) 65 - 99 mg/dL  Glucose, capillary     Status: Abnormal   Collection Time: 05/30/15 12:13 PM  Result Value Ref Range   Glucose-Capillary 276 (H) 65 - 99 mg/dL    Studies/Results: Ct Angio Neck W/cm &/or Wo/cm  05/30/2015  CLINICAL DATA:  60 year old male with posterior right MCA infarct. Altered mental status and agitation. Initial encounter. EXAM: CT ANGIOGRAPHY NECK TECHNIQUE: Multidetector CT imaging of the neck was performed using the standard protocol during bolus administration of intravenous contrast. Multiplanar CT image reconstructions and MIPs were obtained to evaluate the vascular anatomy. Carotid stenosis measurements (when applicable) are obtained utilizing NASCET criteria, using the distal internal carotid diameter as the denominator. CONTRAST:  50 mL MultiHance COMPARISON:  Head CT without contrast 05/28/2015. Intracranial MRA and  brain MRI 05/23/2015. Cerebral angiogram 11/22/2009. CTA head and neck 11/20/2009. FINDINGS: Skeleton: Widespread carious and poor dentition. No acute osseous abnormality identified. Progressed mild maxillary and ethmoid sinus mucosal thickening. Chronic right mastoid sclerosis and opacification. Suggestion of increased inferior right tympanic cavity opacification. Other neck: Small to moderate left and small right layering pleural effusions. Otherwise negative lung apices. No superior mediastinal lymphadenopathy. Negative thyroid (11 mm hypodense right lobe nodule does not meet size criteria for ultrasound follow-up). Larynx, pharynx, parapharyngeal spaces, retropharyngeal space, sublingual space, submandibular glands and parotid glands are within normal limits. Interval postoperative changes to the left globe. Stable visualized posterior fossa brain parenchyma. No cervical  lymphadenopathy. Aortic arch: 3 vessel arch configuration. Minimal visible arch atherosclerosis. Right carotid system: No brachiocephalic artery stenosis. Increased circumferential soft plaque at the right CCA origin and proximal segment of the vessel resulting in up to 50 % stenosis with respect to the distal vessel (series 501, image 28). Increased circumferential soft plaque continues distally, and there is new high-grade radiographic string sign stenosis (series 501, image 56) of the right CCA 27 mm proximal to the right carotid bifurcation. Just downstream of this very severe stenosis there is any elongated focus of thrombus adherent to the medial wall of the vessel (series 501, image 62) measuring 17 mm in length. The tip of this thrombus terminates just proximal to the right carotid bifurcation. At the bifurcation there is progressive soft and calcified plaque compared to 2011 resulting in 50 % stenosis with respect to the distal vessel. Increased calcified plaque in the right ICA bulb, with additional 50% stenosis. Despite all these findings the cervical right ICA remains patent and otherwise appears negative to the skullbase. Visible right ICA siphon is patent. Left carotid system: Mild progression of circumferential soft plaque in the proximal left CCA, less than 50 % stenosis with respect to the distal vessel. Chronic medial soft plaque at the level of the thyroid appears stable. Increased soft plaque at the level of the thyroid cartilage, now resulting in stenosis up to 60 % with respect to the distal vessel (series 501 image 49). Interval postoperative changes to the distal left CCA and left carotid bifurcation with restored left ICA origin patency. No additional hemodynamically significant left carotid stenosis in the neck. Visible left ICA siphon is patent. Vertebral arteries:Mild proximal right subclavian artery origins stenosis appears stable since 2011. Soft plaque at the right vertebral artery  origin does not appear hemodynamically significant. However, there is severe soft plaque and stenosis in the right V2 segment at the C3 level (series 501, image 82) which is new since 2011. Distal to this the right vertebral artery in the neck is stable. In the right V4 segment there is increased soft plaque with increased moderate stenosis at the cisterna magna (series 501, image 109). The vessel remains patent to the vertebrobasilar junction. The visible basilar artery is patent. Chronic soft and calcified plaque in the proximal left subclavian artery has increased. The left vertebral artery arises proximally at, and soft plaque at its origin has progressed with worsening severe left vertebral artery origin stenosis (series 501, image 23). Mildly increased soft plaque in the left V2 segment at the C5-C6 level, but distally the left vertebral artery now all occludes at the C1-skullbase level (series 1 from image 1022 image 106). Flow distally. No significant reconstituted there does appear to be reconstituted left PICA flow, probably from the left AICA whose origin from the basilar is visible and patent. IMPRESSION: 1.  New High-grade Radiographic-String-Sign stenosis of the Right CCA at the level of the larynx since 2011. Bulky 17 mm Thrombus distal to this stenosis adherent to the wall of the vessel at this time. These findings were discussed by telephone with Dr. Pearlean Brownie and NP [facility] on 05/30/2015 at 0834 hours. 2. Surgery to the left carotid bifurcation since 2011 with restored patency of the left ICA origin. Progressed soft plaque in the left CCA resulting in stenosis of 50-60% at the level of the thyroid cartilage. 3. 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 No distal reconstituted flow. 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 4. Right vertebral artery progressed soft plaque in the V2 segment at the C3 level with new severe stenosis. Increased soft plaque in the V4 segment  with new moderate stenosis. 5. Layering pleural effusions, up to moderate on the left and small on the right. 6. Poor dentition.  Chronic right mastoid inflammation. Electronically Signed   By: Odessa Fleming M.D.   On: 05/30/2015 08:48    Medications:Reviewed    @  1  Acute on chronic systolic CHF   Would give additional lasix IV Strict I/O  Contineu coreg  2  CV dz    3.  HL  Continue lipitor 80    LOS: 8 days   Dietrich Pates 05/30/2015, 3:12 PM

## 2015-05-31 ENCOUNTER — Inpatient Hospital Stay (HOSPITAL_COMMUNITY): Payer: Non-veteran care | Admitting: Anesthesiology

## 2015-05-31 ENCOUNTER — Inpatient Hospital Stay (HOSPITAL_COMMUNITY): Payer: Non-veteran care

## 2015-05-31 ENCOUNTER — Encounter (HOSPITAL_COMMUNITY): Payer: Self-pay | Admitting: Anesthesiology

## 2015-05-31 ENCOUNTER — Encounter (HOSPITAL_COMMUNITY)
Admission: EM | Disposition: A | Discharge status: Skilled Nursing Facility | Payer: Self-pay | Source: Home / Self Care | Attending: Neurology

## 2015-05-31 DIAGNOSIS — I6521 Occlusion and stenosis of right carotid artery: Secondary | ICD-10-CM

## 2015-05-31 HISTORY — PX: CAROTID-SUBCLAVIAN BYPASS GRAFT: SHX910

## 2015-05-31 HISTORY — PX: ENDARTERECTOMY: SHX5162

## 2015-05-31 LAB — POCT I-STAT 7, (LYTES, BLD GAS, ICA,H+H)
Acid-Base Excess: 6 mmol/L — ABNORMAL HIGH (ref 0.0–2.0)
Bicarbonate: 29.6 mEq/L — ABNORMAL HIGH (ref 20.0–24.0)
CALCIUM ION: 1.07 mmol/L — AB (ref 1.12–1.23)
HEMATOCRIT: 40 % (ref 39.0–52.0)
Hemoglobin: 13.6 g/dL (ref 13.0–17.0)
O2 SAT: 100 %
PCO2 ART: 39 mmHg (ref 35.0–45.0)
PH ART: 7.487 — AB (ref 7.350–7.450)
PO2 ART: 290 mmHg — AB (ref 80.0–100.0)
Patient temperature: 36.8
Potassium: 3.9 mmol/L (ref 3.5–5.1)
Sodium: 137 mmol/L (ref 135–145)
TCO2: 31 mmol/L (ref 0–100)

## 2015-05-31 LAB — GLUCOSE, CAPILLARY
GLUCOSE-CAPILLARY: 143 mg/dL — AB (ref 65–99)
GLUCOSE-CAPILLARY: 176 mg/dL — AB (ref 65–99)
GLUCOSE-CAPILLARY: 219 mg/dL — AB (ref 65–99)
Glucose-Capillary: 185 mg/dL — ABNORMAL HIGH (ref 65–99)
Glucose-Capillary: 145 mg/dL — ABNORMAL HIGH (ref 65–99)

## 2015-05-31 LAB — POCT I-STAT 4, (NA,K, GLUC, HGB,HCT)
GLUCOSE: 220 mg/dL — AB (ref 65–99)
HCT: 41 % (ref 39.0–52.0)
Hemoglobin: 13.9 g/dL (ref 13.0–17.0)
Potassium: 3.9 mmol/L (ref 3.5–5.1)
Sodium: 137 mmol/L (ref 135–145)

## 2015-05-31 LAB — SURGICAL PCR SCREEN
MRSA, PCR: NEGATIVE
Staphylococcus aureus: NEGATIVE

## 2015-05-31 SURGERY — ENDARTERECTOMY, CAROTID
Anesthesia: General | Site: Neck | Laterality: Right

## 2015-05-31 MED ORDER — SODIUM CHLORIDE 0.9 % IJ SOLN
INTRAMUSCULAR | Status: AC
  Filled 2015-05-31: qty 10

## 2015-05-31 MED ORDER — 0.9 % SODIUM CHLORIDE (POUR BTL) OPTIME
TOPICAL | Status: DC | PRN
  Administered 2015-05-31: 2000 mL

## 2015-05-31 MED ORDER — MORPHINE SULFATE (PF) 2 MG/ML IV SOLN
2.0000 mg | INTRAVENOUS | Status: DC | PRN
  Administered 2015-05-31: 4 mg via INTRAVENOUS
  Filled 2015-05-31: qty 2

## 2015-05-31 MED ORDER — VANCOMYCIN HCL IN DEXTROSE 1-5 GM/200ML-% IV SOLN
1000.0000 mg | Freq: Two times a day (BID) | INTRAVENOUS | Status: DC
  Filled 2015-05-31 (×2): qty 200

## 2015-05-31 MED ORDER — ONDANSETRON HCL 4 MG/2ML IJ SOLN: 4.0000 mg | Freq: Once | INTRAMUSCULAR | Status: DC | PRN

## 2015-05-31 MED ORDER — LIDOCAINE HCL (CARDIAC) 20 MG/ML IV SOLN
INTRAVENOUS | Status: AC
  Filled 2015-05-31: qty 10

## 2015-05-31 MED ORDER — THROMBIN 20000 UNITS EX SOLR
CUTANEOUS | Status: AC
  Filled 2015-05-31: qty 20000

## 2015-05-31 MED ORDER — POTASSIUM CHLORIDE CRYS ER 20 MEQ PO TBCR: 20.0000 meq | EXTENDED_RELEASE_TABLET | Freq: Every day | ORAL | Status: DC | PRN

## 2015-05-31 MED ORDER — HEMOSTATIC AGENTS (NO CHARGE) OPTIME
TOPICAL | Status: DC | PRN
  Administered 2015-05-31: 1 via TOPICAL

## 2015-05-31 MED ORDER — GUAIFENESIN-DM 100-10 MG/5ML PO SYRP: 15.0000 mL | ORAL_SOLUTION | ORAL | Status: DC | PRN

## 2015-05-31 MED ORDER — NITROGLYCERIN 0.2 MG/ML ON CALL CATH LAB
INTRAVENOUS | Status: DC | PRN
Start: 2015-05-31 — End: 2015-05-31
  Administered 2015-05-31: 40 ug via INTRAVENOUS

## 2015-05-31 MED ORDER — CEFAZOLIN SODIUM-DEXTROSE 2-4 GM/100ML-% IV SOLN
2.0000 g | INTRAVENOUS | Status: AC
  Administered 2015-05-31: 2 g via INTRAVENOUS
  Filled 2015-05-31: qty 100

## 2015-05-31 MED ORDER — OXYCODONE HCL 5 MG PO TABS
ORAL_TABLET | ORAL | Status: AC
  Filled 2015-05-31: qty 2

## 2015-05-31 MED ORDER — GLYCOPYRROLATE 0.2 MG/ML IJ SOLN
INTRAMUSCULAR | Status: DC | PRN
  Administered 2015-05-31: 0.6 mg via INTRAVENOUS

## 2015-05-31 MED ORDER — NOREPINEPHRINE BITARTRATE 1 MG/ML IV SOLN
0.0000 ug/min | INTRAVENOUS | Status: AC
  Administered 2015-05-31: 2 ug/min via INTRAVENOUS
  Filled 2015-05-31 (×2): qty 4

## 2015-05-31 MED ORDER — ONDANSETRON HCL 4 MG/2ML IJ SOLN
4.0000 mg | Freq: Four times a day (QID) | INTRAMUSCULAR | Status: DC | PRN
Start: 2015-05-31 — End: 2015-06-13

## 2015-05-31 MED ORDER — LABETALOL HCL 5 MG/ML IV SOLN
10.0000 mg | INTRAVENOUS | Status: DC | PRN
  Filled 2015-05-31: qty 4

## 2015-05-31 MED ORDER — PHENYLEPHRINE 40 MCG/ML (10ML) SYRINGE FOR IV PUSH (FOR BLOOD PRESSURE SUPPORT)
PREFILLED_SYRINGE | INTRAVENOUS | Status: AC
  Filled 2015-05-31: qty 10

## 2015-05-31 MED ORDER — ROCURONIUM BROMIDE 50 MG/5ML IV SOLN
INTRAVENOUS | Status: AC
  Filled 2015-05-31: qty 1

## 2015-05-31 MED ORDER — SODIUM CHLORIDE 0.9 % IV SOLN
INTRAVENOUS | Status: DC
  Administered 2015-05-31: 17:00:00 via INTRAVENOUS

## 2015-05-31 MED ORDER — NEOSTIGMINE METHYLSULFATE 10 MG/10ML IV SOLN
INTRAVENOUS | Status: AC
  Filled 2015-05-31: qty 1

## 2015-05-31 MED ORDER — SODIUM CHLORIDE 0.9 % IV SOLN
250.0000 [IU] | INTRAVENOUS | Status: DC | PRN
  Administered 2015-05-31: 3.2 [IU]/h via INTRAVENOUS

## 2015-05-31 MED ORDER — SODIUM CHLORIDE 0.9 % IV SOLN
500.0000 mL | Freq: Once | INTRAVENOUS | Status: DC | PRN
Start: 2015-05-31 — End: 2015-05-31

## 2015-05-31 MED ORDER — SODIUM CHLORIDE 0.9 % IV SOLN
INTRAVENOUS | Status: DC
  Filled 2015-05-31: qty 2.5

## 2015-05-31 MED ORDER — HYDRALAZINE HCL 20 MG/ML IJ SOLN
5.0000 mg | INTRAMUSCULAR | Status: DC | PRN
  Filled 2015-05-31: qty 0.25

## 2015-05-31 MED ORDER — CEFAZOLIN SODIUM 1 G IJ SOLR
INTRAMUSCULAR | Status: AC
  Filled 2015-05-31: qty 20

## 2015-05-31 MED ORDER — DEXTRAN 40 IN SALINE 10-0.9 % IV SOLN
INTRAVENOUS | Status: DC | PRN
  Administered 2015-05-31: 500 mL

## 2015-05-31 MED ORDER — LIDOCAINE-EPINEPHRINE (PF) 1 %-1:200000 IJ SOLN
INTRAMUSCULAR | Status: AC
  Filled 2015-05-31: qty 30

## 2015-05-31 MED ORDER — SODIUM CHLORIDE 0.9 % IV SOLN
INTRAVENOUS | Status: DC | PRN
  Administered 2015-05-31: 500 mL

## 2015-05-31 MED ORDER — SODIUM CHLORIDE 0.9 % IV SOLN
0.0125 ug/kg/min | INTRAVENOUS | Status: AC
  Administered 2015-05-31: .05 ug/kg/min via INTRAVENOUS
  Filled 2015-05-31 (×2): qty 2000

## 2015-05-31 MED ORDER — SUCCINYLCHOLINE CHLORIDE 20 MG/ML IJ SOLN
INTRAMUSCULAR | Status: AC
  Filled 2015-05-31: qty 2

## 2015-05-31 MED ORDER — OXYCODONE HCL 5 MG PO TABS
5.0000 mg | ORAL_TABLET | ORAL | Status: DC | PRN
  Administered 2015-05-31 – 2015-06-01 (×5): 10 mg via ORAL
  Administered 2015-06-02 – 2015-06-07 (×6): 5 mg via ORAL
  Filled 2015-05-31: qty 1
  Filled 2015-05-31 (×2): qty 2
  Filled 2015-05-31 (×4): qty 1
  Filled 2015-05-31 (×2): qty 2
  Filled 2015-05-31: qty 1
  Filled 2015-05-31: qty 2

## 2015-05-31 MED ORDER — FENTANYL CITRATE (PF) 250 MCG/5ML IJ SOLN
INTRAMUSCULAR | Status: AC
  Filled 2015-05-31: qty 5

## 2015-05-31 MED ORDER — FENTANYL CITRATE (PF) 100 MCG/2ML IJ SOLN
INTRAMUSCULAR | Status: AC
  Administered 2015-05-31: 25 ug via INTRAVENOUS
  Filled 2015-05-31: qty 2

## 2015-05-31 MED ORDER — LIDOCAINE HCL (CARDIAC) 20 MG/ML IV SOLN
INTRAVENOUS | Status: DC | PRN
  Administered 2015-05-31: 80 mg via INTRAVENOUS

## 2015-05-31 MED ORDER — MAGNESIUM SULFATE 2 GM/50ML IV SOLN
2.0000 g | Freq: Every day | INTRAVENOUS | Status: DC | PRN
  Filled 2015-05-31: qty 50

## 2015-05-31 MED ORDER — HEPARIN SODIUM (PORCINE) 1000 UNIT/ML IJ SOLN
INTRAMUSCULAR | Status: DC | PRN
  Administered 2015-05-31: 8000 [IU] via INTRAVENOUS

## 2015-05-31 MED ORDER — GLYCOPYRROLATE 0.2 MG/ML IJ SOLN
INTRAMUSCULAR | Status: AC
  Filled 2015-05-31: qty 3

## 2015-05-31 MED ORDER — ONDANSETRON HCL 4 MG/2ML IJ SOLN
INTRAMUSCULAR | Status: DC | PRN
  Administered 2015-05-31: 4 mg via INTRAVENOUS

## 2015-05-31 MED ORDER — LIDOCAINE HCL (PF) 1 % IJ SOLN
INTRAMUSCULAR | Status: AC
  Filled 2015-05-31: qty 30

## 2015-05-31 MED ORDER — FENTANYL CITRATE (PF) 100 MCG/2ML IJ SOLN
INTRAMUSCULAR | Status: DC | PRN
  Administered 2015-05-31: 150 ug via INTRAVENOUS
  Administered 2015-05-31: 50 ug via INTRAVENOUS

## 2015-05-31 MED ORDER — DEXTRAN 40 IN SALINE 10-0.9 % IV SOLN
INTRAVENOUS | Status: AC
  Filled 2015-05-31: qty 500

## 2015-05-31 MED ORDER — ETOMIDATE 2 MG/ML IV SOLN
INTRAVENOUS | Status: DC | PRN
  Administered 2015-05-31: 14 mg via INTRAVENOUS

## 2015-05-31 MED ORDER — HEPARIN SODIUM (PORCINE) 1000 UNIT/ML IJ SOLN
INTRAMUSCULAR | Status: AC
  Filled 2015-05-31: qty 1

## 2015-05-31 MED ORDER — VANCOMYCIN HCL IN DEXTROSE 750-5 MG/150ML-% IV SOLN
750.0000 mg | Freq: Two times a day (BID) | INTRAVENOUS | Status: AC
  Administered 2015-05-31 – 2015-06-01 (×2): 750 mg via INTRAVENOUS
  Filled 2015-05-31 (×3): qty 150

## 2015-05-31 MED ORDER — PROTAMINE SULFATE 10 MG/ML IV SOLN
INTRAVENOUS | Status: AC
  Filled 2015-05-31: qty 5

## 2015-05-31 MED ORDER — NEOSTIGMINE METHYLSULFATE 10 MG/10ML IV SOLN
INTRAVENOUS | Status: DC | PRN
  Administered 2015-05-31: 4 mg via INTRAVENOUS

## 2015-05-31 MED ORDER — ALUM & MAG HYDROXIDE-SIMETH 200-200-20 MG/5ML PO SUSP: 15.0000 mL | ORAL | Status: DC | PRN

## 2015-05-31 MED ORDER — PHENOL 1.4 % MT LIQD
1.0000 | OROMUCOSAL | Status: DC | PRN
Start: 2015-05-31 — End: 2015-06-13

## 2015-05-31 MED ORDER — FENTANYL CITRATE (PF) 100 MCG/2ML IJ SOLN
25.0000 ug | INTRAMUSCULAR | Status: DC | PRN
  Administered 2015-05-31: 25 ug via INTRAVENOUS

## 2015-05-31 MED ORDER — EPHEDRINE SULFATE 50 MG/ML IJ SOLN
INTRAMUSCULAR | Status: AC
  Filled 2015-05-31: qty 1

## 2015-05-31 MED ORDER — PHENYLEPHRINE HCL 10 MG/ML IJ SOLN
INTRAMUSCULAR | Status: DC | PRN
Start: 2015-05-31 — End: 2015-05-31
  Administered 2015-05-31: 80 ug via INTRAVENOUS

## 2015-05-31 MED ORDER — LACTATED RINGERS IV SOLN
INTRAVENOUS | Status: DC | PRN
  Administered 2015-05-31: 07:00:00 via INTRAVENOUS

## 2015-05-31 MED ORDER — PROPOFOL 10 MG/ML IV BOLUS
INTRAVENOUS | Status: AC
  Filled 2015-05-31: qty 20

## 2015-05-31 MED ORDER — METOPROLOL TARTRATE 1 MG/ML IV SOLN
2.0000 mg | INTRAVENOUS | Status: DC | PRN
Start: 2015-05-31 — End: 2015-06-13

## 2015-05-31 MED ORDER — PROTAMINE SULFATE 10 MG/ML IV SOLN
INTRAVENOUS | Status: DC | PRN
  Administered 2015-05-31: 10 mg via INTRAVENOUS
  Administered 2015-05-31: 20 mg via INTRAVENOUS
  Administered 2015-05-31: 10 mg via INTRAVENOUS

## 2015-05-31 MED ORDER — ROCURONIUM BROMIDE 100 MG/10ML IV SOLN
INTRAVENOUS | Status: DC | PRN
  Administered 2015-05-31: 20 mg via INTRAVENOUS
  Administered 2015-05-31: 30 mg via INTRAVENOUS
  Administered 2015-05-31: 50 mg via INTRAVENOUS

## 2015-05-31 MED ORDER — DOCUSATE SODIUM 100 MG PO CAPS
100.0000 mg | ORAL_CAPSULE | Freq: Every day | ORAL | Status: DC
  Administered 2015-06-02 – 2015-06-13 (×8): 100 mg via ORAL
  Filled 2015-05-31 (×10): qty 1

## 2015-05-31 MED ORDER — OXYCODONE HCL 5 MG PO TABS
ORAL_TABLET | ORAL | Status: AC
  Administered 2015-05-31: 10 mg via ORAL
  Filled 2015-05-31: qty 2

## 2015-05-31 MED ORDER — LIDOCAINE-EPINEPHRINE (PF) 1 %-1:200000 IJ SOLN
INTRAMUSCULAR | Status: DC | PRN
  Administered 2015-05-31: 10 mL

## 2015-05-31 SURGICAL SUPPLY — 56 items
BAG DECANTER FOR FLEXI CONT (MISCELLANEOUS) ×3 IMPLANT
CANISTER SUCTION 2500CC (MISCELLANEOUS) ×3 IMPLANT
CANNULA VESSEL 3MM 2 BLNT TIP (CANNULA) ×9 IMPLANT
CATH ROBINSON RED A/P 18FR (CATHETERS) ×3 IMPLANT
CLIP TI MEDIUM 24 (CLIP) ×3 IMPLANT
CLIP TI WIDE RED SMALL 24 (CLIP) ×3 IMPLANT
CRADLE DONUT ADULT HEAD (MISCELLANEOUS) ×3 IMPLANT
DRAIN CHANNEL 15F RND FF W/TCR (WOUND CARE) IMPLANT
ELECT REM PT RETURN 9FT ADLT (ELECTROSURGICAL) ×3
ELECTRODE REM PT RTRN 9FT ADLT (ELECTROSURGICAL) ×1 IMPLANT
EVACUATOR SILICONE 100CC (DRAIN) IMPLANT
GAUZE SPONGE 4X4 12PLY STRL (GAUZE/BANDAGES/DRESSINGS) ×3 IMPLANT
GAUZE SPONGE 4X4 16PLY XRAY LF (GAUZE/BANDAGES/DRESSINGS) ×3 IMPLANT
GLOVE BIO SURGEON STRL SZ7.5 (GLOVE) ×3 IMPLANT
GLOVE BIOGEL PI IND STRL 6.5 (GLOVE) ×1 IMPLANT
GLOVE BIOGEL PI IND STRL 7.0 (GLOVE) ×1 IMPLANT
GLOVE BIOGEL PI IND STRL 7.5 (GLOVE) ×1 IMPLANT
GLOVE BIOGEL PI IND STRL 8 (GLOVE) ×1 IMPLANT
GLOVE BIOGEL PI INDICATOR 6.5 (GLOVE) ×2
GLOVE BIOGEL PI INDICATOR 7.0 (GLOVE) ×2
GLOVE BIOGEL PI INDICATOR 7.5 (GLOVE) ×2
GLOVE BIOGEL PI INDICATOR 8 (GLOVE) ×2
GLOVE ECLIPSE 7.0 STRL STRAW (GLOVE) ×3 IMPLANT
GOWN STRL REUS W/ TWL LRG LVL3 (GOWN DISPOSABLE) ×3 IMPLANT
GOWN STRL REUS W/TWL LRG LVL3 (GOWN DISPOSABLE) ×6
GRAFT HEMASHIELD 8MM (Vascular Products) ×2 IMPLANT
GRAFT VASC STRG 30X8KNIT (Vascular Products) ×1 IMPLANT
HEMOSTAT SPONGE AVITENE ULTRA (HEMOSTASIS) ×3 IMPLANT
INSERT FOGARTY SM (MISCELLANEOUS) ×3 IMPLANT
KIT BASIN OR (CUSTOM PROCEDURE TRAY) ×3 IMPLANT
KIT ROOM TURNOVER OR (KITS) ×3 IMPLANT
LIQUID BAND (GAUZE/BANDAGES/DRESSINGS) ×3 IMPLANT
LOOP VESSEL MINI RED (MISCELLANEOUS) ×3 IMPLANT
NEEDLE 22X1 1/2 (OR ONLY) (NEEDLE) ×3 IMPLANT
NEEDLE HYPO 25X1 1.5 SAFETY (NEEDLE) ×3 IMPLANT
NS IRRIG 1000ML POUR BTL (IV SOLUTION) ×6 IMPLANT
PACK CAROTID (CUSTOM PROCEDURE TRAY) ×3 IMPLANT
PAD ARMBOARD 7.5X6 YLW CONV (MISCELLANEOUS) ×6 IMPLANT
SHUNT CAROTID BYPASS 10 (VASCULAR PRODUCTS) IMPLANT
SHUNT CAROTID BYPASS 12 (VASCULAR PRODUCTS) IMPLANT
SHUNT CAROTID BYPASS 12FRX15.5 (VASCULAR PRODUCTS) IMPLANT
SPONGE INTESTINAL PEANUT (DISPOSABLE) IMPLANT
SPONGE SURGIFOAM ABS GEL 100 (HEMOSTASIS) IMPLANT
SUT PROLENE 5 0 C 1 24 (SUTURE) ×6 IMPLANT
SUT PROLENE 6 0 BV (SUTURE) ×15 IMPLANT
SUT PROLENE 7 0 BV 1 (SUTURE) IMPLANT
SUT SILK 2 0 FS (SUTURE) IMPLANT
SUT SILK 3 0 (SUTURE) ×2
SUT SILK 3-0 18XBRD TIE 12 (SUTURE) ×1 IMPLANT
SUT VIC AB 3-0 SH 27 (SUTURE) ×4
SUT VIC AB 3-0 SH 27X BRD (SUTURE) ×2 IMPLANT
SUT VICRYL 4-0 PS2 18IN ABS (SUTURE) ×6 IMPLANT
SYR CONTROL 10ML LL (SYRINGE) ×3 IMPLANT
TRAY FOLEY CATH 14FR (SET/KITS/TRAYS/PACK) ×3 IMPLANT
TRAY FOLEY W/METER SILVER 16FR (SET/KITS/TRAYS/PACK) IMPLANT
WATER STERILE IRR 1000ML POUR (IV SOLUTION) ×3 IMPLANT

## 2015-05-31 NOTE — Progress Notes (Addendum)
Patient has 5 beat run of VTach. Patient was asymptomatic. MD, Dietrich Patesoss, Paula notified.  RN will continue to monitor and follow orders per MD.

## 2015-05-31 NOTE — Interval H&P Note (Signed)
History and Physical Interval Note:  05/31/2015 7:01 AM  Ian Garcia  has presented today for surgery, with the diagnosis of Right carotid artery stenosis I65.21  The various methods of treatment have been discussed with the patient and family. After consideration of risks, benefits and other options for treatment, the patient has consented to  Procedure(s): ENDARTERECTOMY CAROTID (Right) BYPASS GRAFT CAROTID-SUBCLAVIAN (Right) as a surgical intervention .  The patient's history has been reviewed, patient examined, no change in status, stable for surgery.  I have reviewed the patient's chart and labs.  Questions were answered to the patient's satisfaction.     Waverly Ferrariickson, Heyli Min

## 2015-05-31 NOTE — OR Nursing (Signed)
During neuro assessment, pt noted to have weakness to L arm from previous stroke, and pupil diameter asymmetry (L greater than R). Pt was able to follow some commands.

## 2015-05-31 NOTE — Anesthesia Procedure Notes (Addendum)
Procedure Name: Intubation Date/Time: 05/31/2015 7:46 AM Performed by: Kyung Rudd Pre-anesthesia Checklist: Patient identified, Emergency Drugs available, Suction available, Patient being monitored and Timeout performed Patient Re-evaluated:Patient Re-evaluated prior to inductionOxygen Delivery Method: Circle system utilized Preoxygenation: Pre-oxygenation with 100% oxygen Intubation Type: IV induction Ventilation: Mask ventilation without difficulty and Oral airway inserted - appropriate to patient size Laryngoscope Size: Mac and 3 Grade View: Grade I Tube type: Oral Tube size: 7.5 mm Number of attempts: 1 Airway Equipment and Method: Stylet and LTA kit utilized Placement Confirmation: ETT inserted through vocal cords under direct vision,  positive ETCO2 and breath sounds checked- equal and bilateral Secured at: 22 cm Tube secured with: Tape Dental Injury: Teeth and Oropharynx as per pre-operative assessment     Central Venous Catheter Insertion Performed by: anesthesiologist 05/31/2015 7:50 AM Patient location: OR. Preanesthetic checklist: patient identified, IV checked, site marked, risks and benefits discussed, surgical consent, monitors and equipment checked, pre-op evaluation, timeout performed and anesthesia consent Position: Trendelenburg Lidocaine 1% used for infiltration Landmarks identified Catheter size: 8 Fr Central line was placed.Double lumen Procedure performed using ultrasound guided technique. Attempts: 1 Following insertion, dressing applied and line sutured. Post procedure assessment: blood return through all ports. Patient tolerated the procedure well with no immediate complications.

## 2015-05-31 NOTE — Transfer of Care (Signed)
Immediate Anesthesia Transfer of Care Note  Patient: Nuala AlphaBruce L Limpert  Procedure(s) Performed: Procedure(s): RIGHT CAROTID ENDARTERECTOMY  (Right) RIGHT BYPASS GRAFT CAROTID-SUBCLAVIAN - USING HEMASHIELD GOLD 8MM BY 30 CM GRAFT (Right)  Patient Location: PACU  Anesthesia Type:General  Level of Consciousness: awake, alert , oriented and patient cooperative  Airway & Oxygen Therapy: Patient Spontanous Breathing and Patient connected to nasal cannula oxygen  Post-op Assessment: Report given to RN and Post -op Vital signs reviewed and stable  Post vital signs: Reviewed and stable  Last Vitals:  Filed Vitals:   05/31/15 0421 05/31/15 0631  BP: 123/58 139/71  Pulse: 51 59  Temp: 36.5 C 36.6 C  Resp: 16 18    Complications: No apparent anesthesia complications

## 2015-05-31 NOTE — Anesthesia Preprocedure Evaluation (Addendum)
Anesthesia Evaluation  Patient identified by MRN, date of birth, ID band Patient awake    Reviewed: Allergy & Precautions, H&P , NPO status , Patient's Chart, lab work & pertinent test results  History of Anesthesia Complications Negative for: history of anesthetic complications  Airway Mallampati: II  TM Distance: >3 FB Neck ROM: full    Dental  (+) Edentulous Lower, Edentulous Upper   Pulmonary Current Smoker,    - rhonchi + decreased breath sounds      Cardiovascular hypertension, + Peripheral Vascular Disease and +CHF  Normal cardiovascular exam Rhythm:regular Rate:Normal  EF 35%   Neuro/Psych Seizures -,  CVA    GI/Hepatic negative GI ROS, Neg liver ROS,   Endo/Other  diabetes  Renal/GU Renal disease     Musculoskeletal   Abdominal   Peds  Hematology negative hematology ROS (+)   Anesthesia Other Findings Patient with minimal functioning 24g IV  Reproductive/Obstetrics negative OB ROS                          Anesthesia Physical Anesthesia Plan  ASA: IV  Anesthesia Plan: General   Post-op Pain Management:    Induction: Intravenous  Airway Management Planned: Oral ETT  Additional Equipment: Arterial line and CVP  Intra-op Plan:   Post-operative Plan: Possible Post-op intubation/ventilation  Informed Consent: I have reviewed the patients History and Physical, chart, labs and discussed the procedure including the risks, benefits and alternatives for the proposed anesthesia with the patient or authorized representative who has indicated his/her understanding and acceptance.   Dental Advisory Given  Plan Discussed with: Anesthesiologist, CRNA and Surgeon  Anesthesia Plan Comments: (GA with ETT, have Norepinephrine be first line vasopressor given significant comorbidity and low EF, would run low dose remi@0 .5905mcg/kg/min for intraop hemodynamic stability and to limit volatile  amount needed.. May need insulin infusion for intraop glucose goal <180  A line to left radial given working on right subclavian for procedure  Patient with significant perioperative risk of morbidity given multiple comorbidities for respiratory or cardiac issues)     Anesthesia Quick Evaluation

## 2015-05-31 NOTE — Progress Notes (Signed)
STROKE TEAM PROGRESS NOTE   HISTORY OF PRESENT ILLNESS Ian Garcia is an 60 y.o. 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  He was admitted to the hospitalist service at [facility]. As part of the stroke workup he had ultrasound of the carotids done which showed a near occlusive thrombus in the right ICA. MRI of the brain was performed which was of poor quality due to motion artifacts. It Did show a posterior parietal acute right MCA infarct.  Due to the abnormality seen on the carotid ultrasound with a near occlusive acute thrombus, he was transferred to [facility] for further evaluation and management options including no any further interventions. His been transferred to the ICU. Patient is a poor historian. Reports living alone. No family available at bedside.  Date last known well: 05/21/2015 Time last known well: Unknown tPA Given: No: Outside time window   SUBJECTIVE (INTERVAL HISTORY) No family members present. The patient  was examined in PACU. His carotid surgery went well. He is hemodynamically stable without any neurological changes   OBJECTIVE Temp:  [97.7 F (36.5 C)-99.8 F (37.7 C)] 98 F (36.7 C) (03/24 1645) Pulse Rate:  [51-80] 73 (03/24 1645) Cardiac Rhythm:  [-] Normal sinus rhythm (03/24 0700) Resp:  [12-21] 20 (03/24 1630) BP: (123-153)/(57-73) 139/71 mmHg (03/24 0631) SpO2:  [87 %-99 %] 97 % (03/24 1645) Arterial Line BP: (127-152)/(54-65) 142/60 mmHg (03/24 1645)  CBC:   Recent Labs Lab  05/31/15 0943 05/31/15 1107  HGB 13.9 13.6  HCT 41.0 40.0    Basic Metabolic Panel:   Recent Labs Lab 05/27/15 1935  05/29/15 0539 05/30/15 0517 05/31/15 0943 05/31/15 1107  NA 138  < > 138 138 137 137  K 4.4  < > 4.7 4.1 3.9 3.9  CL 108  < > 104 103  --   --   CO2 22  < > 23 27  --   --   GLUCOSE 272*  < > 161* 178* 220*  --   BUN 12  < > 14 10  --   --   CREATININE 1.72*  < > 1.67* 1.51*  --   --   CALCIUM 7.9*  < > 8.4* 8.1*  --   --   MG 1.6*  --   --   --   --   --   < > = values in this interval not displayed.  Lipid Panel:     Component Value Date/Time   CHOL 264* 05/24/2015 0357   TRIG 271* 05/24/2015 0357   HDL 30* 05/24/2015 0357   CHOLHDL 8.8 05/24/2015 0357   VLDL 54* 05/24/2015 0357   LDLCALC 180* 05/24/2015 0357   HgbA1c:  Lab Results  Component Value Date   HGBA1C 12.8* 05/24/2015   Urine Drug Screen: No results found for: LABOPIA, COCAINSCRNUR, LABBENZ, AMPHETMU, THCU, LABBARB    IMAGING I have personally reviewed the radiological images below and agree with the radiology interpretations.  CT Head Wo Contrast 05/22/2015   1. Subacute nonhemorrhagic infarction high in the right parietal lobe with secondary edema of that portion of the brain without midline shift.  2. Multiple  small scalp contusions.    CT head without contrast 05/28/2015 Continued normal expected interval evolution of moderate sized right MCA infarct.  No hemorrhagic transformation or other complication identified.  No other new intracranial process.    Mr Ian Garcia Head Wo Contrast 05/23/2015   Motion degraded. No emergent large vessel occlusion to correspond with the acute posterior right MCA infarct.   Mr Brain Wo Contrast 05/23/2015   1. Acute posterior right MCA territory infarct corresponding to the abnormal CT appearance yesterday. Cytotoxic edema without intracranial mass effect at this time.  2. Intracranial MRA from today reported separately.  3. Moderately to  severely motion degraded and the examination had to be discontinued prior to completion due to patient agitation.  4. Consider surveillance for associated intracranial hemorrhage and mass effect with noncontrast CT. (see above)  US Renal 05/23/2015   1. No hydronephrosis.  2. Echogenic normal size kidneys, indicating nonspecific renal parenchymal disease of uncertain chronicity.  3. Mild diffuse bladder wall thickening and trabeculation, suggesting chronic bladder voiding dysfunction such as due to chronic bladder outlet obstruction by an enlarged prostate gland. The prostate gland is not evaluated on this scan.   US Carotid Bilateral 05/23/2015   Right: Nearly occlusive centralized thrombus/plaque within the right common carotid artery which contributes to post stenotic and decreased antegrade flow of the right ICA. This plaque is favored to represent acute embolism/thrombus given the morphology. Formal cerebral angiogram is recommended. As a second best test, CTA. Left: The patient is status post left carotid endarterectomy in September 2011, with atherosclerotic changes of the common carotid artery. Established duplex criteria have not been validated for assessment after surgery, however, there is no evidence of compromised flow. High diastolic component of the flow on the left suggests increase contribution, potentially to the right hemisphere given the small infarction on recent CT.   EEG - This is an abnormal EEG demonstrating posterior slowing on the right compared to that of the left, consistent with the patient's history of a new right PCA infarct. There was no epileptiform activity recorded on this recording.  2D echo - Upper normal LV chamber size with mild LVH and LVEF approximately  35%. There is diffuse hypokinesis with akinesis of the basal  inferolateral/inferior wall. Grade 1 diastolic dysfunction with  indeterminate filling pressure. Upper normal left atrial chamber  size.  Trivial mitral regurgitation. No obvious PFO or ASD.   PHYSICAL EXAM  Temp:  [97.7 F (36.5 C)-99.8 F (37.7 C)] 98 F (36.7 C) (03/24 1645) Pulse Rate:  [51-80] 73 (03/24 1645) Resp:  [12-21] 20 (03/24 1630) BP: (123-153)/(57-73) 139/71 mmHg (03/24 0631) SpO2:  [87 %-99 %] 97 % (03/24 1645) Arterial Line BP: (127-152)/(54-65) 142/60 mmHg (03/24 1645)  General - cachetic looking, well developed, in no apparent distress.  Ophthalmologic - Fundi not visualized due to noncooperation.  Cardiovascular - Regular rate and rhythm.  Mental Status -  Level of arousal and orientation to time, place, and person were intact. Language including expression, naming, repetition, comprehension was assessed and found intact. Fund of Knowledge was assessed and was intact.  Cranial Nerves II - XII - II -  . Does not blink to threat bilaterally and poor effort on exam when testing EOM. But lateral gaze bilaterally  intact when I move to each side and ask him questions on the colors of my clothes which he answers correctly.  III, IV, VI - difficulty with bilateral lateral gaze, eyes mid position. Does not blink to threat  and poor effort on exam when testing EOM. But lateral gaze intact when I move to each side and ask him questions on the colors of my clothes which he answers correctly.  V - Facial sensation intact bilaterally. VII - Facial movement intact bilaterally. VIII - Hearing & vestibular intact bilaterally. X - Palate elevates symmetrically. XI - Chin turning & shoulder shrug intact bilaterally. XII - Tongue protrusion intact. Motor Strength - The patient's strength was 0/5 LUE and left leg stump, 5/5 RUE and right leg stump, bilateral AKA.  Bulk was normal and fasciculations were absent.   Motor Tone - Muscle tone was assessed at the neck and appendages and was normal.  Reflexes - The patient's reflexes were 1+ in UEs  Sensory - Light touch, temperature/pinprick were assessed and were  symmetrical.    Coordination - The patient had normal movements in the right hand with no ataxia or dysmetria.  Tremor was absent.  Gait and Station - not able to test.   ASSESSMENT/PLAN Mr. Ian Garcia is a 60 y.o. male with history of DM, seizures, htn, previous stroke, hld, and carotid stenosis presenting with acute onset of left-sided weakness. He did not receive IV t-PA due to late presentation.  Stroke:  Non-dominant infarct probably embolic from right carotid artery stenosis.  Resultant  Left hemiplegia  MRI  Acute posterior right MCA territory infarct with cytotoxic edema  MRA  No emergent large vessel occlusion to correspond with the acute posterior right MCA infarct.   Carotid Doppler - near occlusion of right carotid artery.   2D Echo - EF 35%. No PFO. No cardiac source of emboli identified.  LDL - 180  HgbA1c 12.6  VTE prophylaxis - subcutaneous heparin    clopidogrel 75 mg daily prior to admission, now on aspirin 325 mg daily and clopidogrel 75 mg daily. Continue DAPT.   Patient counseled to be compliant with his antithrombotic medications  Ongoing aggressive stroke risk factor management  Therapy recommendations: SNF recommended  Disposition: Pending  Right ICA near occlusion  Cause of right MCA infarct  Low BP on dopamine  TTE EF 35%  On IVF, BP goal 130-150  Vascular Surgery consult - 05/25/15 - CTA of Neck recommended when safe. ( Creat - 1.61 GFR - 46 ). Would not be a surgical candidate right away.  Appreciate Dr Adele Dan consult.  Hypotension   On IVF  Off dopamine  May consider midodrine and florinef if still low BP  EF 35%  CCM on board  Cardiomyopathy   EF 35%  Low BP  Appreciate cardiology recommendations:  Systolic CHF: LVEF 35%. Very poor candidate for invasive cardiology procedures. Has CKD. Creatinine mildly improved today to 1.6. Not able to add ACE-I/ARB due to this. On aspirin and plavix. Start low dose coreg  3.125 mg BID as HR has improved up to the 70-80 range.  ? Seizure  Hx of seizure on keppra   Continue keppra  bid  Reported event of left arm shaking over night. Received ativan  EEG showed right slowing, no seizure  Hyperlipidemia  Home meds:  Lipitor 80 mg daily resumed in hospital  LDL 180, goal < 70  Consider adding Zetia.  Continue statin at discharge  Diabetes  HgbA1c 12.6, goal < 7.0  Uncontrolled  Noncompliance  Tobacco abuse  Current smoker  Smoking cessation counseling provided  Pt is NOT willing to quit  Other Stroke Risk Factors  Hx stroke/TIA  S/p left CEA  Other Active  Problems  Elevated creatinine 1.86 -> 1.61  Hyperkalemia 5.2 - repeat in Saturday -> 3.7  Urine culture - 10,000 colonies ENTEROCOCCUS SPECIES  Blood cultures pending - no growth x 4 days  B/l AKA  Cardiomyopathy EF 35% - NSVT - Cardiology following - appreciate assistance.  Poor medical compliance Order CTA neck only today  Hospital day # 9    Continue close neurological and hemodynamic monitoring post carotid surgery. If stable discharge to rehabilitation over the next few days.  Discussed with patient and RN  Delia HeadyPramod Jj Enyeart, MD        To contact Stroke Continuity provider, please refer to [email or web address]. After hours, contact General Neurology

## 2015-05-31 NOTE — Anesthesia Postprocedure Evaluation (Signed)
Anesthesia Post Note  Patient: Nuala AlphaBruce L Reim  Procedure(s) Performed: Procedure(s) (LRB): RIGHT CAROTID ENDARTERECTOMY  (Right) RIGHT BYPASS GRAFT CAROTID-SUBCLAVIAN - USING HEMASHIELD GOLD 8MM BY 30 CM GRAFT (Right)  Patient location during evaluation: PACU Anesthesia Type: General Level of consciousness: awake and alert Pain management: pain level controlled Vital Signs Assessment: post-procedure vital signs reviewed and stable Respiratory status: spontaneous breathing, nonlabored ventilation, respiratory function stable and patient connected to nasal cannula oxygen Cardiovascular status: blood pressure returned to baseline and stable Postop Assessment: no signs of nausea or vomiting Anesthetic complications: no    Last Vitals:  Filed Vitals:   05/31/15 1330 05/31/15 1345  BP:    Pulse: 56 53  Temp: 36.7 C   Resp: 13 14    Last Pain:  Filed Vitals:   05/31/15 1615  PainSc: 0-No pain                 Reino KentJudd, Celester Lech J

## 2015-05-31 NOTE — H&P (View-Only) (Signed)
   VASCULAR SURGERY ASSESSMENT & PLAN:  * Patient had CTA today which I have reviewed. He has a string sign in the mid common carotid artery with some adherent thrombus above that. There is some mild disease in the internal carotid artery. However in addition, he has diffuse disease of his common carotid artery with a tight stenosis in the proximal common carotid artery. I have discussed the case with Dr. Sethi and we agreed that the patient is at high-risk for occlusion of the right carotid system which could be associated with significant morbidity or mortality. For this reason, I have recommended that we proceed with addressing the disease in the right common carotid artery. Given the diffuse disease I do not think this could ideally be addressed with a retrograde carotid stent. I think the best option will probably be a right subclavian artery to carotid bypass with carotid endarterectomy. He appears to have reasonable inflow in the right arm.  *  I have discussed this with the patient. We have reviewed the indications for the procedure including the risk of recurrent stroke or mortality if the artery occludes. In addition, I have discussed the risks of surgery, including but not limited to, 2-3% risk of stroke, wound healing problems, bleeding, and cardiac complications. He is agreeable to proceed.  * I tried to call his sister [name] ([phone]) and left a message.  SUBJECTIVE: No specific complaints.  PHYSICAL EXAM: Filed Vitals:   05/30/15 0204 05/30/15 0540 05/30/15 0926 05/30/15 0931  BP: 152/68 153/71 173/95 165/85  Pulse: 75 72 84 80  Temp: 97.7 F (36.5 C) 98.4 F (36.9 C)    TempSrc: Oral Oral    Resp: 18 18    Height:      Weight:  174 lb 9.7 oz (79.2 kg)    SpO2: 91% 96%     Persistent paralysis left upper extremity. Blood pressure in the left arm is 173/95. The pressure in the right arm is 165/85.  LABS: Lab Results  Component Value Date   WBC 8.3 05/24/2015   HGB  13.5 05/24/2015   HCT 41.6 05/24/2015   MCV 85.1 05/24/2015   PLT 242 05/24/2015   Lab Results  Component Value Date   CREATININE 1.51* 05/30/2015   Lab Results  Component Value Date   INR 1.02 11/19/2009   CBG (last 3)   Recent Labs  05/29/15 2357 05/30/15 0513 05/30/15 0818  GLUCAP 174* 197* 155*    Principal Problem:   Acute ischemic stroke (HCC) Active Problems:   Type II diabetes mellitus with complication, uncontrolled (HCC)   Peripheral vascular disease due to secondary diabetes mellitus (HCC)   Seizure disorder (HCC)   CVA (cerebral infarction)   Acute kidney injury (HCC)   Below knee amputation status (HCC)   Tobacco abuse   Cerebral infarction due to unspecified mechanism   S/P AKA (above knee amputation) bilateral (HCC)   Cardiomyopathy (HCC)   Hypotension   Arterial hypotension   Right middle cerebral artery stroke (HCC)   Acute systolic CHF (congestive heart failure), NYHA class 3 (HCC)    Ian Garcia Beeper: [phone] 05/30/2015    

## 2015-05-31 NOTE — Progress Notes (Signed)
CSW will continue to follow for [facility] when pt is stable following todays surgery   Merlyn LotJenna Holoman, [facility] Gastroenterology IncCSWA Clinical Social Worker [phone]

## 2015-05-31 NOTE — Op Note (Signed)
NAME: Ian Garcia   MRN: [id number] DOB: February 24, 1956    DATE OF OPERATION: 05/31/2015  PREOP DIAGNOSIS: symptomatic right carotid stenosis  POSTOP DIAGNOSIS: same  PROCEDURE:  1. Right carotid endarterectomy 2. Right subclavian artery to carotid bypass with 8 mm Dacron graft 3. Excision of supraclavicular mass (specimen sent to pathology)  SURGEON: Di Kindlehristopher S. Edilia Boickson, MD, FACS  ASSIST: Gretta Beganodd Early M.D., [facility] AlbanyMaureen Collins PA.  ANESTHESIA: Gen.   EBL: minimal  INDICATIONS: Ian Garcia is a 60 y.o. male had a right brain stroke associated with left-sided paralysis. He had a string sign in the common carotid artery with clot just above this and also a tight stenosis in the proximal common carotid artery. Given the risk for occlusion of the carotid and significant risk of recurrent stroke or death if this occurred, right subclavian to carotid bypass and right carotid endarterectomy was recommended.  FINDINGS: The right common carotid artery was diffusely diseased. The right subclavian artery had a good pulse. There was significant plaque within the proximal internal carotid artery and carotid bifurcation.  TECHNIQUE: The patient was taken to the operating room and received a general anesthetic. Monitoring lines placed by anesthesia. The neck and upper chest were prepped and draped in usual sterile fashion.A longitudinal incision was made along the anterior border of the sternocleidomastoid and dissection carried down to the common carotid artery. Given the known clot which was present there were very careful to avoid dissection around the common carotid artery. The internal carotid artery was controlled above the plaque with a vessel loop after the facial vein was divided between 2-0 silk ties. The external carotid artery was controlled with a vessel loop. The superior thyroid artery and also a large pharyngeal branch were controlled with a silk tie. A tunnel was started posterior to  the jugular vein.  Next a supraclavicular incision was made on the right just above the clavicle extending to the mass that was on the lateral part of the neck and this was excised and sent to pathology. This was a large exophytic mass. The dissection was carried down through the platysma and the clavicular head of the sternocleidomastoid was partially divided. The  Supraclavicular fat pad was mobilized laterally and superiorly allowing exposure of the anterior scalene muscle. The phrenic nerve was carefully preserved on the medial aspect of the anterior scalene.  The muscle was then divided using electrocautery on a low current. The posterior fascia was then divided and the subclavian artery identified. The internal mammary artery was controlled with a vessel loop. The thyrocervical trunk was controlled with vessel. A tunnel was increased from this incision posterior to the jugular vein to the neck incision. The patient was then heparinized.  Next the subclavian artery was clamped proximally and distally and the branches were controlled with Vesseloops. A longitudinal arteriotomy was made in the subclavian artery. The 8 mm graft was then sewn end-to-side to the artery using continuous 5-0 Prolene suture. Prior to completing this anastomosis the artery was backbled and flushed and the anastomosis completed. The graft was clamped. The graft was then brought through the previously created tunnel.   Next the internal carotid artery was clamped as was the external carotid artery and common carotid artery. A longitudinal arteriotomy was made in the common carotid artery. The clot which was present on CT scan was visualized and removed. An endarterectomy plane was established proximally and the carotid bifurcation was endarterectomized as was the internal carotid artery and external  carotid artery. The common carotid artery below the bifurcation was then divided. The endarterectomy was completed and the artery  irrigated with copious amount of heparin and dextran all loose debris removed. The Dacron patch was then spatulated and sewn encompassing the carotid bifurcation, external carotid artery and internal carotid arteries. This was done with 2 continuous 6-0 Prolene sutures. Prior to completing this anastomosis the arteries were backbled and flushed. Flow was reestablished first to the external carotid artery into the internal carotid artery. At the completion was an excellent signal in the internal carotid artery.  Hemostasis was obtained in the wounds. The supraclavicular incision was closed with a deep layer of 3-0 Vicryl and the skin closed with 4-0 Vicryl.  The neck incision was closed with a deep layer of 3-0 Vicryl. The platysma was closed with running 3-0 Vicryl. The skin was closed with a 4-0 subcuticular stitch. Sterile dressing was applied.  The patient awoke at his neurologic baseline. He was transferred to the RR in stable condition.   Waverly Ferrari, MD, FACS [facility]  DATE OF DICTATION:   05/31/2015

## 2015-05-31 NOTE — Progress Notes (Signed)
Patient would like additional information about his procedure this morning before signing consent.  MD Edilia Boickson notified.

## 2015-05-31 NOTE — Progress Notes (Addendum)
   Patient doing well Incision soft without hematoma Palpable right radial pulse  S/P PROCEDURE:  1. Right carotid endarterectomy 2. Right subclavian artery to carotid bypass with 8 mm Dacron graft 3. Excision of supraclavicular mass (specimen sent to pathology)  COLLINS, EMMA MAUREEN PA-C  Agree with above.  Doing well post op  Waverly Ferrarihristopher Dickson, MD, FACS Beeper [phone] Office: [phone]

## 2015-05-31 NOTE — Progress Notes (Signed)
[age] YOM s/p R CEA, on vancomycin for surgical prophylaxis, pharmacy to adjust dosage base one renal function. Scr 1.51 on 3/23, est. crcl ~ 40 ml/min. He received ancef 2g prior to procedure.   Plan: Adjust Vancomycin to 750 mg IV Q 12 hrs x 2 dose. Pharmacy sign off.  Thanks.   Bayard HuggerMei Hasina Kreager, PharmD, BCPS  Clinical Pharmacist  Pager: [phone]

## 2015-05-31 NOTE — Progress Notes (Signed)
   05/31/15 1528  PT Visit Information  Last PT Received On 05/31/15  Reason Eval/Treat Not Completed Patient at procedure or test/unavailable;Medical issues which prohibited therapy (Pt off unit for surgery will f/u post op.  )  History of Present Illness Patient is a 60 yo male admitted 05/22/15 with Lt sided weakness and decreased sensation.  MRI showed acute Rt MCA infarct.  Patient also with thrombus in Rt ICA.    PMH:  DM, neuropathy, PVD, bilateral AKA's, HTN, seizure disorder, CVA, carotid stenosis, tobacco use, CHF with EF 35%

## 2015-06-01 LAB — CBC
HCT: 37.6 % — ABNORMAL LOW (ref 39.0–52.0)
Hemoglobin: 11.8 g/dL — ABNORMAL LOW (ref 13.0–17.0)
MCH: 26.5 pg (ref 26.0–34.0)
MCHC: 31.4 g/dL (ref 30.0–36.0)
MCV: 84.5 fL (ref 78.0–100.0)
Platelets: 315 10*3/uL (ref 150–400)
RBC: 4.45 MIL/uL (ref 4.22–5.81)
RDW: 14 % (ref 11.5–15.5)
WBC: 11.8 10*3/uL — ABNORMAL HIGH (ref 4.0–10.5)

## 2015-06-01 LAB — BASIC METABOLIC PANEL
Anion gap: 8 (ref 5–15)
BUN: 15 mg/dL (ref 6–20)
CALCIUM: 7.9 mg/dL — AB (ref 8.9–10.3)
CO2: 27 mmol/L (ref 22–32)
CREATININE: 1.71 mg/dL — AB (ref 0.61–1.24)
Chloride: 101 mmol/L (ref 101–111)
GFR calc non Af Amer: 42 mL/min — ABNORMAL LOW (ref >60)
GFR, EST AFRICAN AMERICAN: 49 mL/min — AB (ref >60)
Glucose, Bld: 198 mg/dL — ABNORMAL HIGH (ref 65–99)
Potassium: 4.7 mmol/L (ref 3.5–5.1)
SODIUM: 136 mmol/L (ref 135–145)

## 2015-06-01 LAB — GLUCOSE, CAPILLARY
GLUCOSE-CAPILLARY: 190 mg/dL — AB (ref 65–99)
GLUCOSE-CAPILLARY: 136 mg/dL — AB (ref 65–99)
GLUCOSE-CAPILLARY: 121 mg/dL — AB (ref 65–99)
Glucose-Capillary: 190 mg/dL — ABNORMAL HIGH (ref 65–99)
Glucose-Capillary: 159 mg/dL — ABNORMAL HIGH (ref 65–99)
Glucose-Capillary: 144 mg/dL — ABNORMAL HIGH (ref 65–99)

## 2015-06-01 NOTE — Progress Notes (Addendum)
STROKE TEAM PROGRESS NOTE   HISTORY OF PRESENT ILLNESS AT TIME OF ADMISSION Ian Garcia is an 60 y.o. 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  He was admitted to the hospitalist service at [facility]. As part of the stroke workup he had ultrasound of the carotids done which showed a near occlusive thrombus in the right ICA. MRI of the brain was performed which was of poor quality due to motion artifacts. It Did show a posterior parietal acute right MCA infarct.  Due to the abnormality seen on the carotid ultrasound with a near occlusive acute thrombus, he was transferred to [facility] for further evaluation and management options including no any further interventions. His been transferred to the ICU. Patient is a poor historian. Reports living alone. No family available at bedside.  Date last known well: 05/21/2015 Time last known well: Unknown tPA Given: No: Outside time window   SUBJECTIVE (INTERVAL HISTORY) No family members present. Discussed case with RN.  Patient has had waxing/waning lethargy.  He is s/p carotid surgery.  Not major events overnight   OBJECTIVE Temp:  [97.6 F (36.4 C)-98.8 F (37.1 C)] 98.4 F (36.9 C) (03/25 0744) Pulse Rate:  [53-93] 89 (03/25 0400) Cardiac Rhythm:  [-] Normal sinus rhythm (03/25 1000) Resp:  [12-21] 14 (03/25 0400) BP: (98-159)/(60-85) 159/85 mmHg (03/24 2313) SpO2:  [90 %-99 %] 94 % (03/25 0400) Arterial Line BP: (127-159)/(54-65) 144/64 mmHg (03/25 0400) Weight:  [79.8 kg (175  lb 14.8 oz)-80.4 kg (177 lb 4 oz)] 80.4 kg (177 lb 4 oz) (03/25 0433)  CBC:   Recent Labs Lab 05/31/15 1107 06/01/15 0515  WBC  --  11.8*  HGB 13.6 11.8*  HCT 40.0 37.6*  MCV  --  84.5  PLT  --  315    Basic Metabolic Panel:   Recent Labs Lab 05/27/15 1935  05/30/15 0517 05/31/15 0943 05/31/15 1107 06/01/15 0515  NA 138  < > 138 137 137 136  K 4.4  < > 4.1 3.9 3.9 4.7  CL 108  < > 103  --   --  101  CO2 22  < > 27  --   --  27  GLUCOSE 272*  < > 178* 220*  --  198*  BUN 12  < > 10  --   --  15  CREATININE 1.72*  < > 1.51*  --   --  1.71*  CALCIUM 7.9*  < > 8.1*  --   --  7.9*  MG 1.6*  --   --   --   --   --   < > = values in this interval not displayed.  Lipid Panel:     Component Value Date/Time   CHOL 264* 05/24/2015 0357   TRIG 271* 05/24/2015 0357   HDL 30* 05/24/2015 0357   CHOLHDL 8.8 05/24/2015 0357   VLDL 54* 05/24/2015 0357   LDLCALC 180* 05/24/2015 0357   HgbA1c:  Lab Results  Component Value Date   HGBA1C 12.8* 05/24/2015   Urine Drug Screen: No results found for: LABOPIA, COCAINSCRNUR, LABBENZ, AMPHETMU, THCU, LABBARB    IMAGING I have personally  reviewed the radiological images below and agree with the radiology interpretations.  CT Head Wo Contrast 05/22/2015   1. Subacute nonhemorrhagic infarction high in the right parietal lobe with secondary edema of that portion of the brain without midline shift.  2. Multiple small scalp contusions.   CT head without contrast 05/28/2015 Continued normal expected interval evolution of moderate sized right MCA infarct.  No hemorrhagic transformation or other complication identified.  No other new intracranial process.  Portable chest x-ray 1 view 05/31/2015 1. Left jugular catheter terminates in the region of the left brachiocephalic vein. 2. Hypoinflation with left basilar atelectasis versus consolidation.   [name] Head Wo Contrast 05/23/2015   Motion degraded. No emergent large vessel  occlusion to correspond with the acute posterior right MCA infarct.   Mr Brain Wo Contrast 05/23/2015   1. Acute posterior right MCA territory infarct corresponding to the abnormal CT appearance yesterday. Cytotoxic edema without intracranial mass effect at this time.  2. Intracranial MRA from today reported separately.  3. Moderately to severely motion degraded and the examination had to be discontinued prior to completion due to patient agitation.  4. Consider surveillance for associated intracranial hemorrhage and mass effect with noncontrast CT. (see above)  CT angiogram of the neck with and without contrast 05/30/2015 1. New High-grade Radiographic-String-Sign stenosis of the Right CCA at the level of the larynx since 2011. Bulky 17 mm Thrombus distal to this stenosis adherent to the wall of the vessel at this time. These findings were discussed by telephone with Dr. Pearlean Brownie and NP [facility] on 05/30/2015 at 0834 hours. 2. Surgery to the left carotid bifurcation since 2011 with restored patency of the left ICA origin. Progressed soft plaque in the left CCA resulting in stenosis of 50-60% at the level of the thyroid cartilage. 3. 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 No distal reconstituted flow. 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 4. Right vertebral artery progressed soft plaque in the V2 segment at the C3 level with new severe stenosis. Increased soft plaque in the V4 segment with new moderate stenosis. 5. Layering pleural effusions, up to moderate on the left and small on the right. 6. Poor dentition. Chronic right mastoid inflammation.   US Renal 05/23/2015   1. No hydronephrosis.  2. Echogenic normal size kidneys, indicating nonspecific renal parenchymal disease of uncertain chronicity.  3. Mild diffuse bladder wall thickening and trabeculation, suggesting chronic bladder voiding dysfunction such as due to chronic bladder outlet obstruction by  an enlarged prostate gland. The prostate gland is not evaluated on this scan.   US Carotid Bilateral 05/23/2015   Right: Nearly occlusive centralized thrombus/plaque within the right common carotid artery which contributes to post stenotic and decreased antegrade flow of the right ICA. This plaque is favored to represent acute embolism/thrombus given the morphology. Formal cerebral angiogram is recommended. As a second best test, CTA. Left: The patient is status post left carotid endarterectomy in September 2011, with atherosclerotic changes of the common carotid artery. Established duplex criteria have not been validated for assessment after surgery, however, there is no evidence of compromised flow. High diastolic component of the flow on the left suggests increase contribution, potentially to the right hemisphere given the small infarction on recent CT.   EEG - This is an abnormal EEG demonstrating posterior slowing on the right compared to that of the left, consistent with the patient's history of a new right PCA infarct. There was no epileptiform activity recorded  on this recording.  2D echo - Upper normal LV chamber size with mild LVH and LVEF approximately  35%. There is diffuse hypokinesis with akinesis of the basal  inferolateral/inferior wall. Grade 1 diastolic dysfunction with  indeterminate filling pressure. Upper normal left atrial chamber  size. Trivial mitral regurgitation. No obvious PFO or ASD.   PHYSICAL EXAM  Temp:  [97.6 F (36.4 C)-98.8 F (37.1 C)] 98.4 F (36.9 C) (03/25 0744) Pulse Rate:  [53-93] 89 (03/25 0400) Resp:  [12-21] 14 (03/25 0400) BP: (98-159)/(60-85) 159/85 mmHg (03/24 2313) SpO2:  [90 %-99 %] 94 % (03/25 0400) Arterial Line BP: (127-159)/(54-65) 144/64 mmHg (03/25 0400) Weight:  [79.8 kg (175 lb 14.8 oz)-80.4 kg (177 lb 4 oz)] 80.4 kg (177 lb 4 oz) (03/25 0433)  General - cachetic looking, well developed, in no apparent distress.  Cardiovascular  - Regular rate and rhythm.  Chest:  CTA  Extremities:  Left AKA and right BKA  Mental Status -  Lethargic however, orientation to year (not month), place, and person were intact. Language including expression, naming, repetition, comprehension was assessed and found intact.  Cranial Nerves II - XII - II -  . Does not blink to threat bilaterally and poor effort on exam when testing EOM. But lateral gaze bilaterally  intact when I move to each side  III, IV, VI - difficulty with bilateral lateral gaze, eyes mid position. Does not blink to threat and poor effort on exam when testing EOM. But lateral gaze intact  V - Facial sensation intact bilaterally. VII - Facial movement intact bilaterally. VIII - Hearing & vestibular intact bilaterally. X - Palate elevates symmetrically. XI - Chin turning & shoulder shrug intact bilaterally. XII - Tongue protrusion intact.  Motor Strength - The patient's strength was 0/5 LUE and left leg stump, 5/5 RUE and right leg stump, bilateral AKA.  Bulk was normal and fasciculations were absent.   Motor Tone - Muscle tone was assessed at the neck and appendages and was normal.  Sensory - Light touch, temperature/pinprick were assessed and were symmetrical.    Coordination - The patient had normal movements in the right hand with no ataxia or dysmetria.    Gait and Station - not able to test.   ASSESSMENT/PLAN Ian Garcia is a 60 y.o. male with history of DM, seizures, htn, previous stroke, hld, and carotid stenosis presenting with acute onset of left-sided weakness. He did not receive IV t-PA due to late presentation.  Stroke:  Non-dominant infarct probably embolic from right carotid artery stenosis.  Resultant  Left hemiplegia  MRI  Acute posterior right MCA territory infarct with cytotoxic edema  MRA  No emergent large vessel occlusion to correspond with the acute posterior right MCA infarct.   Carotid Doppler - near occlusion of right  carotid artery.   CTA of neck 05/30/2015 - New High-grade Radiographic-String-Sign stenosis of the Right CCA with thrombus.  2D Echo - EF 35%. No PFO. No cardiac source of emboli identified.  LDL - 180  HgbA1c 12.6  VTE prophylaxis - subcutaneous heparin Diet clear liquid Room service appropriate?: Yes; Fluid consistency:: Thin  clopidogrel 75 mg daily prior to admission, now on aspirin 325 mg daily and clopidogrel 75 mg daily. Continue DAPT.   Patient counseled to be compliant with his antithrombotic medications  Ongoing aggressive stroke risk factor management  Therapy recommendations: Per case management - plan is to discharge home with home health services.  Disposition: Pending  Right ICA  near occlusion  Cause of right MCA infarct  Low BP on dopamine  TTE EF 35%  On IVF, BP goal 130-150  Vascular Surgery consult - S/P Right CEA 05/31/15 with excision of a supraclavicular mass -> pathology.  Appreciate Dr Adele Dan consult.  Hypotension   On IVF  Off dopamine  May consider midodrine and florinef if still low BP  EF 35%  CCM on board  Cardiomyopathy   EF 35%  Low BP  Appreciate cardiology recommendations:  Systolic CHF: LVEF 35%. Very poor candidate for invasive cardiology procedures. Has CKD. Creatinine mildly improved today to 1.6. Not able to add ACE-I/ARB due to this. On aspirin and plavix. Start low dose coreg 3.125 mg BID as HR has improved up to the 70-80 range.  ? Seizure  Hx of seizure on keppra   Continue keppra 1000mg  bid  Reported event of left arm shaking over night. Received ativan  EEG showed right slowing, no seizure  Hyperlipidemia  Home meds:  Lipitor 80 mg daily resumed in hospital  LDL 180, goal < 70  Consider adding Zetia.  Continue statin at discharge  Diabetes  HgbA1c 12.6, goal < 7.0  Uncontrolled  Noncompliance  Tobacco abuse  Current smoker  Smoking cessation counseling provided  Pt is NOT willing to  quit  Other Stroke Risk Factors  Hx stroke/TIA  S/p left CEA  Other Active Problems  Elevated creatinine 1.86 -> 1.61  Hyperkalemia 5.2 - repeat in Saturday -> 3.7  Urine culture - 10,000 colonies ENTEROCOCCUS SPECIES  Blood cultures  - no growth x 5 days  B/l AKA  Mild anemia  Mild leukocytosis  Cardiomyopathy EF 35% - NSVT - Cardiology following - appreciate assistance.  Poor medical compliance  S/P Right CEA 05/31/15 with excision of a supraclavicular mass -> pathology.  Portable chest x-ray - 05/31/2015 -  Hypoinflation with left basilar atelectasis versus consolidation.  Per case management - plan is to discharge home with home health services when ready.  Hospital day # 10  ATTENDING NOTE: Patient was seen and examined by me personally. Documentation reflects findings. The laboratory and radiographic studies reviewed by me. ROS completed by me personally and pertinent positives fully documented  Condition: Stabilizing  Assessment and plan completed by me personally and fully documented above. Plans/Recommendations include:     May wish to check Mg and Phos to ensure repletion  Recommend aggressive control of blood sugars to preserve ischemic penumbra  Leukocytosis:  Recommend follow up  Manage hyperlipidemia  SIGNED BY: Dr. Sula Soda         To contact Stroke Continuity provider, please refer to [email or web address]. After hours, contact General Neurology

## 2015-06-01 NOTE — Progress Notes (Signed)
Subjective:  He had a right carotid endarterectomy with a subclavian carotid bypass also yesterday.  yesterday that he tolerated fairly well.  He is sleepy this morning but when I was able to arouse him he stated that he would like a beer.  He is not currently having any shortness of breath or chest pain.  Objective:  Vital Signs in the last 24 hours: BP 159/85 mmHg  Pulse 89  Temp(Src) 98.4 F (36.9 C) (Oral)  Resp 14  Ht 4\' 8"  (1.422 m)  Wt 80.4 kg (177 lb 4 oz)  BMI 39.76 kg/m2  SpO2 94%  Physical Exam: Middle-aged white male appearing older than stated age Lungs:  Clear Cardiac:  Regular rhythm, normal S1 and S2, no S3 Abdomen:  Soft, nontender, no masses Extremities:  Bilateral AK amputations   Intake/Output from previous day: 03/24 0701 - 03/25 0700 In: 2156.7 [P.O.:240; I.V.:1766.7; IV Piggyback:150] Out: 1775 [Urine:1625; Blood:150]  Weight Filed Weights   05/30/15 0540 05/31/15 2200 06/01/15 0433  Weight: 79.2 kg (174 lb 9.7 oz) 79.8 kg (175 lb 14.8 oz) 80.4 kg (177 lb 4 oz)    Lab Results: Basic Metabolic Panel:  Recent Labs  16/12/9601/23/17 0517 05/31/15 0943 05/31/15 1107 06/01/15 0515  NA 138 137 137 136  K 4.1 3.9 3.9 4.7  CL 103  --   --  101  CO2 27  --   --  27  GLUCOSE 178* 220*  --  198*  BUN 10  --   --  15  CREATININE 1.51*  --   --  1.71*   CBC:  Recent Labs  05/31/15 1107 06/01/15 0515  WBC  --  11.8*  HGB 13.6 11.8*  HCT 40.0 37.6*  MCV  --  84.5  PLT  --  315   Telemetry: Sinus rhythm  Assessment/Plan:  1.  Ischemic cardiomyopathy-currently stable does not appear to be volume overloaded 2.  Hypertensive heart disease 3.  Recent stroke with recent surgical procedure 4.  Chronic kidney disease slightly worse  Recommendations:  Continue current treatment and continue to treat blood pressure.     Darden PalmerW. Spencer Karry Barrilleaux, Jr.  MD [facility]  06/01/2015, 9:45 AM

## 2015-06-01 NOTE — Progress Notes (Signed)
Left radial arterial line removed this am per protocol. Pt tolerated procedure well.

## 2015-06-01 NOTE — Progress Notes (Addendum)
  Progress Note    06/01/2015 7:42 AM 1 Day Post-Op  Subjective:  No complaints; states he must've lost a day with the anesthesia   Afebrile HR  70's-90's NSR 140's-150's systolic 94% 3LO2NC  Filed Vitals:   05/31/15 2313 06/01/15 0400  BP: 159/85   Pulse: 93 89  Temp: 98 F (36.7 C) 98.8 F (37.1 C)  Resp: 13 14    Physical Exam: Cardiac:  regular Lungs:  Non labored Incisions:  Clean and dry without hematoma Neuro:  Moving right arm and leg normally; left hemiplegia; tongue is midline and smile is symmetric    CBC    Component Value Date/Time   WBC 11.8* 06/01/2015 0515   RBC 4.45 06/01/2015 0515   HGB 11.8* 06/01/2015 0515   HCT 37.6* 06/01/2015 0515   PLT 315 06/01/2015 0515   MCV 84.5 06/01/2015 0515   MCH 26.5 06/01/2015 0515   MCHC 31.4 06/01/2015 0515   RDW 14.0 06/01/2015 0515   LYMPHSABS 2.1 05/22/2015 1943   MONOABS 0.6 05/22/2015 1943   EOSABS 0.2 05/22/2015 1943   BASOSABS 0.1 05/22/2015 1943    BMET    Component Value Date/Time   NA 136 06/01/2015 0515   K 4.7 06/01/2015 0515   CL 101 06/01/2015 0515   CO2 27 06/01/2015 0515   GLUCOSE 198* 06/01/2015 0515   BUN 15 06/01/2015 0515   CREATININE 1.71* 06/01/2015 0515   CALCIUM 7.9* 06/01/2015 0515   GFRNONAA 42* 06/01/2015 0515   GFRAA 49* 06/01/2015 0515    INR    Component Value Date/Time   INR 1.02 11/19/2009 1518     Intake/Output Summary (Last 24 hours) at 06/01/15 0742 Last data filed at 06/01/15 0455  Gross per 24 hour  Intake 2156.67 ml  Output   1775 ml  Net 381.67 ml     Assessment:  60 y.o. male is s/p:  1. Right carotid endarterectomy 2. Right subclavian artery to carotid bypass with 8 mm Dacron graft 3. Excision of supraclavicular mass (specimen sent to pathology)  1 Day Post-Op  Plan: -pt doing well from surgical prospective this am with palpable right radial pulse -left hemiplegia from preoperative stroke -right side with normal movements -creatinine  up from yesterday, but close to baseline -acute blood loss anemia-pt tolerating  -supraclavicular mass excision-path pending -DVT prophylaxis:  SQ heparin -pt on ASA/Plavix   Doreatha MassedSamantha Rhyne, PA-C [facility] [phone] 06/01/2015 7:42 AM   Agree with above.  Stable from our standpoint.  Dispo per Neuro service Call if questions  Fabienne Brunsharles Fields, MD [facility] [place] Office: [phone] Pager: [phone]

## 2015-06-02 DIAGNOSIS — I6529 Occlusion and stenosis of unspecified carotid artery: Secondary | ICD-10-CM | POA: Insufficient documentation

## 2015-06-02 LAB — GLUCOSE, CAPILLARY
GLUCOSE-CAPILLARY: 144 mg/dL — AB (ref 65–99)
GLUCOSE-CAPILLARY: 143 mg/dL — AB (ref 65–99)
GLUCOSE-CAPILLARY: 173 mg/dL — AB (ref 65–99)
Glucose-Capillary: 155 mg/dL — ABNORMAL HIGH (ref 65–99)
Glucose-Capillary: 164 mg/dL — ABNORMAL HIGH (ref 65–99)
Glucose-Capillary: 173 mg/dL — ABNORMAL HIGH (ref 65–99)

## 2015-06-02 MED ORDER — MORPHINE SULFATE (PF) 2 MG/ML IV SOLN
1.0000 mg | INTRAVENOUS | Status: DC | PRN
  Administered 2015-06-04: 1 mg via INTRAVENOUS
  Filled 2015-06-02: qty 1

## 2015-06-02 NOTE — Progress Notes (Signed)
Pt unable to void after foley catheter removed, bladder scan showed 254mls. In and out cathed pt and drained 900mls. Marisue Ivanobyn Marigene Erler RN

## 2015-06-02 NOTE — Progress Notes (Signed)
Pt's family spoke with RN regarding missing patient belongings. According to family, patient is missing a duffel bag from 5W. Unit called and bag was located and brought to patients current location. Belongings now at bedside.

## 2015-06-02 NOTE — Progress Notes (Signed)
RN  Was assisting patient with his evening medications, and noted patient was coughing while sipping his drink. Pt also noted to have increased oxygen demand from 3L to 4L per [place]. RN will pass along in shift report to day shift nurse to ask MD about possible speech therapy consultation. RN will continue to monitor.

## 2015-06-02 NOTE — Progress Notes (Signed)
STROKE TEAM PROGRESS NOTE   HISTORY OF PRESENT ILLNESS AT TIME OF ADMISSION Ian Garcia is an 60 y.o. 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  He was admitted to the hospitalist service at [facility]. As part of the stroke workup he had ultrasound of the carotids done which showed a near occlusive thrombus in the right ICA. MRI of the brain was performed which was of poor quality due to motion artifacts. It Did show a posterior parietal acute right MCA infarct.  Due to the abnormality seen on the carotid ultrasound with a near occlusive acute thrombus, he was transferred to [facility] for further evaluation and management options including no any further interventions. His been transferred to the ICU. Patient is a poor historian. Reports living alone. No family available at bedside.  Date last known well: 05/21/2015 Time last known well: Unknown tPA Given: No: Outside time window   SUBJECTIVE (INTERVAL HISTORY) No family members present. Discussed case with RN.  Patient's waxing/waning lethargy was greatly improved.  RN team limited the sedative medications overnight and this has improved the mental status of the patient.  No major events overnight  OBJECTIVE Temp:  [98 F (36.7 C)-99 F (37.2 C)] 98 F (36.7 C) (03/26 1555) Pulse Rate:  [44-92] 88 (03/26 1150) Cardiac Rhythm:  [-] Normal sinus rhythm (03/26 1555) Resp:  [9-20] 12 (03/26 1150) BP: (96-146)/(69-74) 119/73 mmHg (03/26 1150) SpO2:  [92 %-97 %] 96 % (03/26  1150) Weight:  [75.9 kg (167 lb 5.3 oz)] 75.9 kg (167 lb 5.3 oz) (03/26 0438)  CBC:   Recent Labs Lab 05/31/15 1107 06/01/15 0515  WBC  --  11.8*  HGB 13.6 11.8*  HCT 40.0 37.6*  MCV  --  84.5  PLT  --  315    Basic Metabolic Panel:   Recent Labs Lab 05/27/15 1935  05/30/15 0517 05/31/15 0943 05/31/15 1107 06/01/15 0515  NA 138  < > 138 137 137 136  K 4.4  < > 4.1 3.9 3.9 4.7  CL 108  < > 103  --   --  101  CO2 22  < > 27  --   --  27  GLUCOSE 272*  < > 178* 220*  --  198*  BUN 12  < > 10  --   --  15  CREATININE 1.72*  < > 1.51*  --   --  1.71*  CALCIUM 7.9*  < > 8.1*  --   --  7.9*  MG 1.6*  --   --   --   --   --   < > = values in this interval not displayed.  Lipid Panel:     Component Value Date/Time   CHOL 264* 05/24/2015 0357   TRIG 271* 05/24/2015 0357   HDL 30* 05/24/2015 0357   CHOLHDL 8.8 05/24/2015 0357   VLDL 54* 05/24/2015 0357   LDLCALC 180* 05/24/2015 0357   HgbA1c:  Lab Results  Component Value Date   HGBA1C 12.8* 05/24/2015   Urine Drug Screen: No results found for: LABOPIA, COCAINSCRNUR, LABBENZ, AMPHETMU, THCU, LABBARB    IMAGING I have personally reviewed  the radiological images below and agree with the radiology interpretations.  CT Head Wo Contrast 05/22/2015   1. Subacute nonhemorrhagic infarction high in the right parietal lobe with secondary edema of that portion of the brain without midline shift.  2. Multiple small scalp contusions.   CT head without contrast 05/28/2015 Continued normal expected interval evolution of moderate sized right MCA infarct.  No hemorrhagic transformation or other complication identified.  No other new intracranial process.  Portable chest x-ray 1 view 05/31/2015 1. Left jugular catheter terminates in the region of the left brachiocephalic vein. 2. Hypoinflation with left basilar atelectasis versus consolidation.   [name] Head Wo Contrast 05/23/2015   Motion degraded. No emergent large  vessel occlusion to correspond with the acute posterior right MCA infarct.   Mr Brain Wo Contrast 05/23/2015   1. Acute posterior right MCA territory infarct corresponding to the abnormal CT appearance yesterday. Cytotoxic edema without intracranial mass effect at this time.  2. Intracranial MRA from today reported separately.  3. Moderately to severely motion degraded and the examination had to be discontinued prior to completion due to patient agitation.  4. Consider surveillance for associated intracranial hemorrhage and mass effect with noncontrast CT. (see above)  CT angiogram of the neck with and without contrast 05/30/2015 1. New High-grade Radiographic-String-Sign stenosis of the Right CCA at the level of the larynx since 2011. Bulky 17 mm Thrombus distal to this stenosis adherent to the wall of the vessel at this time. These findings were discussed by telephone with Dr. Pearlean Brownie and NP [facility] on 05/30/2015 at 0834 hours. 2. Surgery to the left carotid bifurcation since 2011 with restored patency of the left ICA origin. Progressed soft plaque in the left CCA resulting in stenosis of 50-60% at the level of the thyroid cartilage. 3. 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 No distal reconstituted flow. 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 4. Right vertebral artery progressed soft plaque in the V2 segment at the C3 level with new severe stenosis. Increased soft plaque in the V4 segment with new moderate stenosis. 5. Layering pleural effusions, up to moderate on the left and small on the right. 6. Poor dentition. Chronic right mastoid inflammation.   US Renal 05/23/2015   1. No hydronephrosis.  2. Echogenic normal size kidneys, indicating nonspecific renal parenchymal disease of uncertain chronicity.  3. Mild diffuse bladder wall thickening and trabeculation, suggesting chronic bladder voiding dysfunction such as due to chronic bladder outlet  obstruction by an enlarged prostate gland. The prostate gland is not evaluated on this scan.   US Carotid Bilateral 05/23/2015   Right: Nearly occlusive centralized thrombus/plaque within the right common carotid artery which contributes to post stenotic and decreased antegrade flow of the right ICA. This plaque is favored to represent acute embolism/thrombus given the morphology. Formal cerebral angiogram is recommended. As a second best test, CTA. Left: The patient is status post left carotid endarterectomy in September 2011, with atherosclerotic changes of the common carotid artery. Established duplex criteria have not been validated for assessment after surgery, however, there is no evidence of compromised flow. High diastolic component of the flow on the left suggests increase contribution, potentially to the right hemisphere given the small infarction on recent CT.   EEG - This is an abnormal EEG demonstrating posterior slowing on the right compared to that of the left, consistent with the patient's history of a new right PCA infarct. There was no epileptiform activity recorded on  this recording.  2D echo - Upper normal LV chamber size with mild LVH and LVEF approximately  35%. There is diffuse hypokinesis with akinesis of the basal  inferolateral/inferior wall. Grade 1 diastolic dysfunction with  indeterminate filling pressure. Upper normal left atrial chamber  size. Trivial mitral regurgitation. No obvious PFO or ASD.   PHYSICAL EXAM  Temp:  [98 F (36.7 C)-99 F (37.2 C)] 98 F (36.7 C) (03/26 1555) Pulse Rate:  [44-92] 88 (03/26 1150) Resp:  [9-20] 12 (03/26 1150) BP: (96-146)/(69-74) 119/73 mmHg (03/26 1150) SpO2:  [92 %-97 %] 96 % (03/26 1150) Weight:  [75.9 kg (167 lb 5.3 oz)] 75.9 kg (167 lb 5.3 oz) (03/26 0438)  General - cachetic looking, well developed, in no apparent distress.  Cardiovascular - Regular rate and rhythm.  Chest:  CTA  Abd:  Soft NT, ND normal  bowel sounds  Extremities:  Left AKA and right BKA  Mental Status -  Lethargic however, orientation to year (not month), place, and person were intact. Language including expression, naming, repetition, comprehension was assessed and found intact.  Cranial Nerves II - XII - II -  . Does not blink to threat bilaterally and poor effort on exam when testing EOM. But lateral gaze bilaterally  intact when I move to each side  III, IV, VI - difficulty with bilateral lateral gaze, eyes mid position. Does not blink to threat and poor effort on exam when testing EOM. But lateral gaze intact  V - Facial sensation intact bilaterally. VII - Facial movement intact bilaterally. VIII - Hearing & vestibular intact bilaterally. X - Palate elevates symmetrically. XI - Chin turning & shoulder shrug intact bilaterally. XII - Tongue protrusion intact.  Motor Strength - The patient's strength was 0/5 LUE and left leg stump, 5/5 RUE and right leg stump, bilateral AKA.  Bulk was normal and fasciculations were absent.   Motor Tone - Muscle tone was assessed at the neck and appendages and was normal.  Sensory - Light touch, temperature/pinprick were assessed and were symmetrical.    Coordination - The patient had normal movements in the right hand with no ataxia or dysmetria.    Gait and Station - not able to test.   ASSESSMENT/PLAN Ian Garcia is a 60 y.o. male with history of DM, seizures, htn, previous stroke, hld, and carotid stenosis presenting with acute onset of left-sided weakness. He did not receive IV t-PA due to late presentation.  Stroke:  Non-dominant infarct probably embolic from right carotid artery stenosis.  Resultant  Left hemiplegia  MRI  Acute posterior right MCA territory infarct with cytotoxic edema  MRA  No emergent large vessel occlusion to correspond with the acute posterior right MCA infarct.   Carotid Doppler - near occlusion of right carotid artery.   CTA of neck  05/30/2015 - New High-grade Radiographic-String-Sign stenosis of the Right CCA with thrombus.  2D Echo - EF 35%. No PFO. No cardiac source of emboli identified.  LDL - 180  HgbA1c 12.6  VTE prophylaxis - subcutaneous heparin Diet heart healthy/carb modified Room service appropriate?: Yes; Fluid consistency:: Thin  clopidogrel 75 mg daily prior to admission, now on aspirin 325 mg daily and clopidogrel 75 mg daily. Continue DAPT.   Patient counseled to be compliant with his antithrombotic medications  Ongoing aggressive stroke risk factor management  Therapy recommendations: Per case management - plan is to discharge home with home health services.  Disposition: Pending  Right ICA near occlusion  Cause  of right MCA infarct  Low BP on dopamine  TTE EF 35%  On IVF, BP goal 130-150  Vascular Surgery consult - S/P Right CEA 05/31/15 with excision of a supraclavicular mass -> pathology.  Appreciate Dr Adele Danickson's consult.  Hypotension   On IVF  Off dopamine  May consider midodrine and florinef if still low BP  EF 35%  CCM on board  Cardiomyopathy   EF 35%  Low BP  Appreciate cardiology recommendations:  Systolic CHF: LVEF 35%. Very poor candidate for invasive cardiology procedures. Has CKD. Creatinine mildly improved today to 1.6. Not able to add ACE-I/ARB due to this. On aspirin and plavix. Start low dose coreg 3.125 mg BID as HR has improved up to the 70-80 range.  ? Seizure  Hx of seizure on keppra   Continue keppra 1000mg  bid  Reported event of left arm shaking over night. Received ativan  EEG showed right slowing, no seizure  Hyperlipidemia  Home meds:  Lipitor 80 mg daily resumed in hospital  LDL 180, goal < 70  Consider adding Zetia.  Continue statin at discharge  Diabetes  HgbA1c 12.6, goal < 7.0  Uncontrolled  Noncompliance  Tobacco abuse  Current smoker  Smoking cessation counseling provided  Pt is NOT willing to quit  Other  Stroke Risk Factors  Hx stroke/TIA  S/p left CEA  Other Active Problems  Elevated creatinine 1.86 -> 1.61  Hyperkalemia 5.2 - repeat in Saturday -> 3.7  Urine culture - 10,000 colonies ENTEROCOCCUS SPECIES  Blood cultures  - no growth x 5 days  B/l AKA  Mild anemia  Mild leukocytosis  Cardiomyopathy EF 35% - NSVT - Cardiology following - appreciate assistance.  Poor medical compliance  S/P Right CEA 05/31/15 with excision of a supraclavicular mass -> pathology pending.  Portable chest x-ray - 05/31/2015 -  Hypoinflation with left basilar atelectasis versus consolidation.  Per case management - plan is to discharge home with home health services when ready.  Repeat labs Monday. Bmet, CBC, Mg, Phos.   Delton Seeavid Rinehuls PA-C [facility] Pager [phone] 06/02/2015, 4:17 PM   Hospital day # 11  ATTENDING NOTE: Patient was seen and examined by me personally. Documentation reflects findings. The laboratory and radiographic studies reviewed by me. ROS completed by me personally and pertinent positives fully documented  Condition: Stabilizing  Assessment and plan completed by me personally and fully documented above. Plans/Recommendations include:     Minimized prn medications over the evening to help with improved daytime wakefulness  Stroke work-up completed  SIGNED BY: Dr. Sula Sodahere Faheem Ziemann         To contact Stroke Continuity provider, please refer to [email or web address]. After hours, contact General Neurology

## 2015-06-02 NOTE — Progress Notes (Signed)
Subjective:  He awakens but states he just doesn't feel well.  No complaints of shortness of breath or chest pain.  Objective:  Vital Signs in the last 24 hours: BP 100/74 mmHg  Pulse 92  Temp(Src) 98.1 F (36.7 C) (Oral)  Resp 12  Ht 4\' 8"  (1.422 m)  Wt 75.9 kg (167 lb 5.3 oz)  BMI 37.54 kg/m2  SpO2 92%  Physical Exam: Middle-aged white male appearing older than stated age Lungs:  Clear Cardiac:  Regular rhythm, normal S1 and S2, no S3 Abdomen:  Soft, nontender, no masses Extremities:  Bilateral AK amputations   Intake/Output from previous day: 03/25 0701 - 03/26 0700 In: 870 [P.O.:720; IV Piggyback:150] Out: 3400 [Urine:3400]  Weight Filed Weights   05/31/15 2200 06/01/15 0433 06/02/15 0438  Weight: 79.8 kg (175 lb 14.8 oz) 80.4 kg (177 lb 4 oz) 75.9 kg (167 lb 5.3 oz)    Lab Results: Basic Metabolic Panel:  Recent Labs  84/13/2403/24/17 0943 05/31/15 1107 06/01/15 0515  NA 137 137 136  K 3.9 3.9 4.7  CL  --   --  101  CO2  --   --  27  GLUCOSE 220*  --  198*  BUN  --   --  15  CREATININE  --   --  1.71*   CBC:  Recent Labs  05/31/15 1107 06/01/15 0515  WBC  --  11.8*  HGB 13.6 11.8*  HCT 40.0 37.6*  MCV  --  84.5  PLT  --  315   Telemetry: Sinus rhythm  Assessment/Plan:  1.  Ischemic cardiomyopathy-currently stable does not appear to be volume overloaded 2.  Hypertensive heart disease 3.  Recent stroke with recent surgical procedure 4.  Chronic kidney disease   Recommendations:  Recheck renal function as was slightly up yesterday.     Darden PalmerW. Spencer Rosezella Kronick, Jr.  MD [facility]  06/02/2015, 10:04 AM

## 2015-06-02 NOTE — Progress Notes (Signed)
  Progress Note    06/02/2015 8:04 AM 2 Days Post-Op  Subjective:  Sleeping and awakes to voice    Filed Vitals:   06/02/15 0029 06/02/15 0438  BP: 124/74 96/70  Pulse: 91 84  Temp: 98.3 F (36.8 C) 98.1 F (36.7 C)  Resp: 10 20    Physical Exam: Lungs:  Non labored Incisions:  Clean and dry Extremities:  Left hemiplegia; +palpable right radial pulse.   CBC    Component Value Date/Time   WBC 11.8* 06/01/2015 0515   RBC 4.45 06/01/2015 0515   HGB 11.8* 06/01/2015 0515   HCT 37.6* 06/01/2015 0515   PLT 315 06/01/2015 0515   MCV 84.5 06/01/2015 0515   MCH 26.5 06/01/2015 0515   MCHC 31.4 06/01/2015 0515   RDW 14.0 06/01/2015 0515   LYMPHSABS 2.1 05/22/2015 1943   MONOABS 0.6 05/22/2015 1943   EOSABS 0.2 05/22/2015 1943   BASOSABS 0.1 05/22/2015 1943    BMET    Component Value Date/Time   NA 136 06/01/2015 0515   K 4.7 06/01/2015 0515   CL 101 06/01/2015 0515   CO2 27 06/01/2015 0515   GLUCOSE 198* 06/01/2015 0515   BUN 15 06/01/2015 0515   CREATININE 1.71* 06/01/2015 0515   CALCIUM 7.9* 06/01/2015 0515   GFRNONAA 42* 06/01/2015 0515   GFRAA 49* 06/01/2015 0515    INR    Component Value Date/Time   INR 1.02 11/19/2009 1518     Intake/Output Summary (Last 24 hours) at 06/02/15 0804 Last data filed at 06/02/15 0600  Gross per 24 hour  Intake    870 ml  Output   3400 ml  Net  -2530 ml     Assessment:  60 y.o. male is s/p:  1. Right carotid endarterectomy 2. Right subclavian artery to carotid bypass with 8 mm Dacron graft 3. Excision of supraclavicular mass (specimen sent to pathology)   2 Days Post-Op  Plan: -pt neuro status unchanged from yesterday -+palpable right radial pulse -disposition per neuro -supraclavicular mass pathology still pending   Doreatha MassedSamantha Rashawn Rolon, PA-C [facility] [phone] 06/02/2015 8:04 AM

## 2015-06-03 ENCOUNTER — Inpatient Hospital Stay (HOSPITAL_COMMUNITY): Payer: Non-veteran care

## 2015-06-03 ENCOUNTER — Encounter (HOSPITAL_COMMUNITY): Payer: Self-pay | Admitting: Vascular Surgery

## 2015-06-03 DIAGNOSIS — E860 Dehydration: Secondary | ICD-10-CM | POA: Insufficient documentation

## 2015-06-03 DIAGNOSIS — R1314 Dysphagia, pharyngoesophageal phase: Secondary | ICD-10-CM | POA: Insufficient documentation

## 2015-06-03 LAB — CBC
HCT: 37.9 % — ABNORMAL LOW (ref 39.0–52.0)
HEMOGLOBIN: 11.6 g/dL — AB (ref 13.0–17.0)
MCH: 26 pg (ref 26.0–34.0)
MCHC: 30.6 g/dL (ref 30.0–36.0)
MCV: 85 fL (ref 78.0–100.0)
Platelets: 351 10*3/uL (ref 150–400)
RBC: 4.46 MIL/uL (ref 4.22–5.81)
RDW: 13.6 % (ref 11.5–15.5)
WBC: 10.4 10*3/uL (ref 4.0–10.5)

## 2015-06-03 LAB — GLUCOSE, CAPILLARY
GLUCOSE-CAPILLARY: 210 mg/dL — AB (ref 65–99)
GLUCOSE-CAPILLARY: 143 mg/dL — AB (ref 65–99)
GLUCOSE-CAPILLARY: 159 mg/dL — AB (ref 65–99)
Glucose-Capillary: 117 mg/dL — ABNORMAL HIGH (ref 65–99)
Glucose-Capillary: 148 mg/dL — ABNORMAL HIGH (ref 65–99)
Glucose-Capillary: 156 mg/dL — ABNORMAL HIGH (ref 65–99)
Glucose-Capillary: 188 mg/dL — ABNORMAL HIGH (ref 65–99)
Glucose-Capillary: 243 mg/dL — ABNORMAL HIGH (ref 65–99)

## 2015-06-03 LAB — BASIC METABOLIC PANEL
ANION GAP: 11 (ref 5–15)
BUN: 27 mg/dL — ABNORMAL HIGH (ref 6–20)
CHLORIDE: 93 mmol/L — AB (ref 101–111)
CO2: 34 mmol/L — AB (ref 22–32)
Calcium: 8.5 mg/dL — ABNORMAL LOW (ref 8.9–10.3)
Creatinine, Ser: 2.19 mg/dL — ABNORMAL HIGH (ref 0.61–1.24)
GFR calc Af Amer: 36 mL/min — ABNORMAL LOW (ref >60)
GFR calc non Af Amer: 31 mL/min — ABNORMAL LOW (ref >60)
GLUCOSE: 228 mg/dL — AB (ref 65–99)
POTASSIUM: 3.6 mmol/L (ref 3.5–5.1)
Sodium: 138 mmol/L (ref 135–145)

## 2015-06-03 LAB — MAGNESIUM: Magnesium: 1.8 mg/dL (ref 1.7–2.4)

## 2015-06-03 LAB — PHOSPHORUS: PHOSPHORUS: 4.1 mg/dL (ref 2.5–4.6)

## 2015-06-03 MED ORDER — ASPIRIN 300 MG RE SUPP
300.0000 mg | Freq: Once | RECTAL | Status: AC
  Administered 2015-06-03: 300 mg via RECTAL
  Filled 2015-06-03: qty 1

## 2015-06-03 MED ORDER — LEVETIRACETAM 500 MG/5ML IV SOLN
1000.0000 mg | Freq: Two times a day (BID) | INTRAVENOUS | Status: DC
Start: 2015-06-03 — End: 2015-06-05
  Administered 2015-06-03 – 2015-06-05 (×4): 1000 mg via INTRAVENOUS
  Filled 2015-06-03 (×7): qty 10

## 2015-06-03 MED ORDER — FUROSEMIDE 10 MG/ML IJ SOLN
40.0000 mg | Freq: Every day | INTRAMUSCULAR | Status: DC
  Administered 2015-06-04: 40 mg via INTRAVENOUS
  Filled 2015-06-03: qty 4

## 2015-06-03 MED ORDER — PANTOPRAZOLE SODIUM 40 MG IV SOLR
40.0000 mg | INTRAVENOUS | Status: DC
  Administered 2015-06-03 – 2015-06-04 (×2): 40 mg via INTRAVENOUS
  Filled 2015-06-03 (×2): qty 40

## 2015-06-03 MED ORDER — SODIUM CHLORIDE 0.9 % IV SOLN
INTRAVENOUS | Status: DC
  Administered 2015-06-03: 11:00:00 via INTRAVENOUS
  Administered 2015-06-04: 75 mL/h via INTRAVENOUS
  Administered 2015-06-04 – 2015-06-10 (×7): via INTRAVENOUS

## 2015-06-03 NOTE — Progress Notes (Signed)
Inpatient Diabetes Program Recommendations  AACE/ADA: New Consensus Statement on Inpatient Glycemic Control (2015)  Target Ranges:  Prepandial:   less than 140 mg/dL      Peak postprandial:   less than 180 mg/dL (1-2 hours)      Critically ill patients:  140 - 180 mg/dL   Review of Glycemic Control  Diabetes history: DM2 Outpatient Diabetes medications: Lantus 30 units BID, Humulin R 10 units TID with meals, Glipizide 10 mg BID, Metformin 500 mg BID Current orders for Inpatient glycemic control: Novolog 0-15 units Q4H  Inpatient Diabetes Program Recommendations: Insulin - Basal: Over the past 24 hours, patient has received a total of Novolog 23 units for correction. Please consider ordering Lantus 15 units daily (based on 75 kg x 0.2 units).  Insulin-Correction: If patient is eating, please consider changing frequency of CBGs and Novolog to ACHS.  Thanks, Orlando PennerMarie Melessia Kaus, RN, MSN, CDE Diabetes Coordinator Inpatient Diabetes Program [phone] (Team Pager from 8am to 5pm) [phone] ([facility] office) [phone] [facility] office) [phone] [facility] office)'

## 2015-06-03 NOTE — Progress Notes (Signed)
Patient has been refusing to let staff turn him. Will continue to try and monitor.

## 2015-06-03 NOTE — Progress Notes (Signed)
Cardiologist: Dr. Rennis Golden ([facility]) Subjective:  No complaints, no SOB, no CP. Resting.   Objective:  Vital Signs in the last 24 hours: Temp:  [97.5 F (36.4 C)-99 F (37.2 C)] 99 F (37.2 C) (03/27 1137) Pulse Rate:  [78-99] 80 (03/27 1137) Resp:  [12-22] 15 (03/27 1137) BP: (121-144)/(63-75) 122/63 mmHg (03/27 1137) SpO2:  [92 %-96 %] 92 % (03/27 1137) Weight:  [166 lb 7.2 oz (75.5 kg)] 166 lb 7.2 oz (75.5 kg) (03/27 0358)  Intake/Output from previous day: 03/26 0701 - 03/27 0700 In: 750 [P.O.:750] Out: 1900 [Urine:1900]   Physical Exam: General: Well developed, well nourished, in no acute distress. Head:  Normocephalic and atraumatic. Lungs: Clear to auscultation and percussion. Heart: Normal S1 and S2.  No murmur, rubs or gallops.  Abdomen: soft, non-tender, positive bowel sounds. Extremities: No clubbing or cyanosis. No edema. Amputations noted Neurologic: Alert and oriented x 3.    Lab Results:  Recent Labs  06/01/15 0515 06/03/15 0605  WBC 11.8* 10.4  HGB 11.8* 11.6*  PLT 315 351    Recent Labs  06/01/15 0515 06/03/15 0605  NA 136 138  K 4.7 3.6  CL 101 93*  CO2 27 34*  GLUCOSE 198* 228*  BUN 15 27*  CREATININE 1.71* 2.19*    Imaging: Dg Chest Port 1 View  06/03/2015  CLINICAL DATA:  Recent CVA EXAM: PORTABLE CHEST 1 VIEW COMPARISON:  May 31, 2015 FINDINGS: Central catheter tip is in the left innominate vein. No pneumothorax. There is mild bibasilar atelectatic change. Lungs elsewhere clear. Heart is slightly enlarged with pulmonary vascularity within normal limits. No adenopathy. Is postoperative change in the right supraclavicular region with soft tissue air in the supraclavicular region, stable. IMPRESSION: Persistent postoperative change with soft tissue air in the right supraclavicular region. Mild bibasilar atelectatic change. No edema or consolidation. Stable cardiac prominence. Electronically Signed   By: Bretta Bang III M.D.    On: 06/03/2015 07:48   Personally viewed.   Telemetry: no adverse rhythms Personally viewed.  Cardiac Studies:  EF 35%.   Meds: Scheduled Meds: .  stroke: mapping our early stages of recovery book   Does not apply Once  . amitriptyline  25 mg Oral QHS  . aspirin  325 mg Oral Daily  . atorvastatin  80 mg Oral q1800  . carvedilol  3.125 mg Oral BID WC  . cholecalciferol  800 Units Oral Daily  . clopidogrel  75 mg Oral Daily  . docusate sodium  100 mg Oral Daily  . [START ON 06/04/2015] furosemide  40 mg Intravenous Daily  . gabapentin  900 mg Oral BID  . heparin  5,000 Units Subcutaneous 3 times per day  . insulin aspart  0-15 Units Subcutaneous 6 times per day  . levETIRAcetam  1,000 mg Intravenous Q12H  . magnesium oxide  800 mg Oral Daily  . pantoprazole (PROTONIX) IV  40 mg Intravenous Q24H   Continuous Infusions: . sodium chloride 75 mL/hr at 06/03/15 1128   PRN Meds:.acetaminophen, alum & mag hydroxide-simeth, guaiFENesin-dextromethorphan, hydrALAZINE, iohexol, labetalol, magnesium sulfate 1 - 4 g bolus IVPB, metoprolol, morphine injection, ondansetron, oxyCODONE, phenol, potassium chloride, senna-docusate  Assessment/Plan:  Principal Problem:   Acute ischemic stroke (HCC) Active Problems:   Type II diabetes mellitus with complication, uncontrolled (HCC)   Peripheral vascular disease due to secondary diabetes mellitus (HCC)   Seizure disorder (HCC)   CVA (cerebral infarction)   Acute kidney injury (HCC)   Below knee amputation  status (HCC)   Tobacco abuse   Cerebral infarction due to unspecified mechanism   S/P AKA (above knee amputation) bilateral (HCC)   Cardiomyopathy (HCC)   Right middle cerebral artery stroke (HCC)   Acute systolic CHF (congestive heart failure), NYHA class 3 (HCC)   Carotid stenosis   Right carotid endarterectomy and right subclavian to carotid bypass - Dr. Edilia Boickson.  -Doing well post op  Chronic systolic HF - holding lasix (creat  increased) -EF 35% (?chronicity of this) -continue coreg -no ACE-I because of renal function    SKAINS, MARK 06/03/2015, 12:54 PM

## 2015-06-03 NOTE — Progress Notes (Signed)
Patient having difficulty swallowing this morning. Pt choking on water when attempting to take medications. Pt refused breakfast and medications due to the choking. Paged stroke team (primary) to let them know of pt's status.

## 2015-06-03 NOTE — Progress Notes (Signed)
   VASCULAR SURGERY ASSESSMENT & PLAN:  * 3 Days Post-Op s/p: Right carotid endarterectomy and right subclavian to carotid  bypass  *  Doing well from our standpoint.  SUBJECTIVE: No complaints.  PHYSICAL EXAM: Filed Vitals:   06/02/15 2240 06/03/15 0305 06/03/15 0308 06/03/15 0358  BP: 144/70 133/70    Pulse: 99 82    Temp:   98 F (36.7 C)   TempSrc:   Axillary   Resp: 15 15    Height:      Weight:    166 lb 7.2 oz (75.5 kg)  SpO2: 96% 96%     Incisions look fine. Neuro at baseline with left hemiplegia  LABS: Lab Results  Component Value Date   WBC 10.4 06/03/2015   HGB 11.6* 06/03/2015   HCT 37.9* 06/03/2015   MCV 85.0 06/03/2015   PLT 351 06/03/2015   Lab Results  Component Value Date   CREATININE 2.19* 06/03/2015   CBG (last 3)   Recent Labs  06/02/15 1950 06/02/15 2300 06/03/15 0307  GLUCAP 173* 243* 210*    Principal Problem:   Acute ischemic stroke (HCC) Active Problems:   Type II diabetes mellitus with complication, uncontrolled (HCC)   Peripheral vascular disease due to secondary diabetes mellitus (HCC)   Seizure disorder (HCC)   CVA (cerebral infarction)   Acute kidney injury (HCC)   Below knee amputation status (HCC)   Tobacco abuse   Cerebral infarction due to unspecified mechanism   S/P AKA (above knee amputation) bilateral (HCC)   Cardiomyopathy (HCC)   Right middle cerebral artery stroke (HCC)   Acute systolic CHF (congestive heart failure), NYHA class 3 (HCC)   Carotid stenosis    Cari CarawayChris Kyomi Hector Beeper: [phone] 06/03/2015

## 2015-06-03 NOTE — Progress Notes (Signed)
Physical Therapy Treatment Patient Details Name: Ian Garcia MRN: [id number] DOB: 01-Jan-1956 Today's Date: 06/03/2015    History of Present Illness Patient is a 60 yo male admitted 05/22/15 with Lt sided weakness and decreased sensation.  MRI showed acute Rt MCA infarct.  Patient also with thrombus in Rt ICA.    Pt is now s/p Rt carotid endarterectomy and Rt subclavian to carotid bypass.  PMH:  DM, neuropathy, PVD, bilateral AKA's, HTN, seizure disorder, CVA, carotid stenosis, tobacco use, CHF with EF 35%    PT Comments    Yifan did not make progress today due to increased lethargy and new Rt neck pain.  Pillows and towel roll placed to decrease Rt cervical rotation and pillow under Rt side to initiate repositioning q2h, RN made aware. Pt currently requires +2 total assist for all aspects of bed mobility.   Follow Up Recommendations  SNF;Supervision/Assistance - 24 hour     Equipment Recommendations  None recommended by PT    Recommendations for Other Services       Precautions / Restrictions Precautions Precautions: Fall Precaution Comments: pt with poor spatial awareness:  tends to push Restrictions Weight Bearing Restrictions: No    Mobility  Bed Mobility Overal bed mobility: +2 for physical assistance;Needs Assistance Bed Mobility: Supine to Sit;Sit to Supine     Supine to sit: Total assist;+2 for physical assistance;HOB elevated Sit to supine: Total assist;+2 for physical assistance   General bed mobility comments: Total assist for all aspects of bed mobility.  Pt reaching w/ Rt UE but not able to assist.  Transfers                 General transfer comment: deferred for pt/therapist safety  Ambulation/Gait                 Stairs            Wheelchair Mobility    Modified Rankin (Stroke Patients Only)       Balance Overall balance assessment: Needs assistance Sitting-balance support: Single extremity supported;Feet  supported Sitting balance-Leahy Scale: Zero Sitting balance - Comments: Pt unable to engage core, despite reaching exercises, and requires total assist to maintain upright.  Pt tends to push posteriorly, Rt hand placed in lap w/ some improvement. Postural control: Left lateral lean;Posterior lean                          Cognition Arousal/Alertness: Awake/alert Behavior During Therapy: Flat affect Overall Cognitive Status: Impaired/Different from baseline Area of Impairment: Orientation;Attention;Safety/judgement;Awareness;Problem solving Orientation Level: Disoriented to;Time Current Attention Level: Selective     Safety/Judgement: Decreased awareness of safety;Decreased awareness of deficits Awareness: Intellectual Problem Solving: Slow processing;Decreased initiation;Difficulty sequencing;Requires verbal cues;Requires tactile cues General Comments: pt with L neglect; cues to turn head to find objects.  Not initiating any activity and requires multimodal cues.    Exercises General Exercises - Upper Extremity Shoulder Flexion: AROM;Right;5 reps;Seated;Limitations (limited to ~100 deg AROM) Other Exercises Other Exercises: Reaching w/ Rt UE in sitting to attempt engaging core.      General Comments General comments (skin integrity, edema, etc.): Pt received w/ Rt cervical rotation due to surgical site guarding.  Pt says "no" when asked to look Lt  but does rotate head some w/ max encouragement.  Head propped w/ pillow and towel roll to assist to neutral.  Pt encouraged to look Lt throughout the day.      Pertinent Vitals/Pain Pain Assessment:  Faces Faces Pain Scale: Hurts little more Pain Location: surgical site Pain Descriptors / Indicators: Discomfort;Grimacing;Guarding Pain Intervention(s): Limited activity within patient's tolerance;Monitored during session;Repositioned    Home Living                      Prior Function            PT Goals (current  goals can now be found in the care plan section) Acute Rehab PT Goals Patient Stated Goal: none stated Progress towards PT goals: Not progressing toward goals - comment (due to increased lethargy and pain in neck)    Frequency  Min 2X/week    PT Plan Frequency needs to be updated    Co-evaluation             End of Session Equipment Utilized During Treatment: Oxygen Activity Tolerance: Patient limited by lethargy;Patient limited by pain Patient left: in bed;with call bell/phone within reach;with bed alarm set (repositioned using pillow, turned to the left)     Time: 1610-9604 PT Time Calculation (min) (ACUTE ONLY): 24 min  Charges:  $Therapeutic Exercise: 8-22 mins $Therapeutic Activity: 8-22 mins                    G Codes:      Encarnacion Chu PT, DPT  Pager: [phone] Phone: [phone] 06/03/2015, 3:01 PM

## 2015-06-03 NOTE — Progress Notes (Addendum)
STROKE TEAM PROGRESS NOTE   SUBJECTIVE (INTERVAL HISTORY) No family members present. RN reports worsening of swallow - coughs with liquids, put back on NPO and waiting for swallow re-eval. Put back on IVF and decrease lasix due to elevated Cre. Pt has urinary retention needing foley catheter.     OBJECTIVE Temp:  [97.5 F (36.4 C)-99 F (37.2 C)] 98.7 F (37.1 C) (03/27 1507) Pulse Rate:  [78-99] 87 (03/27 1502) Cardiac Rhythm:  [-] Normal sinus rhythm (03/27 0700) Resp:  [12-19] 19 (03/27 1502) BP: (121-144)/(63-70) 141/70 mmHg (03/27 1502) SpO2:  [92 %-96 %] 94 % (03/27 1502) Weight:  [166 lb 7.2 oz (75.5 kg)] 166 lb 7.2 oz (75.5 kg) (03/27 0358)  CBC:   Recent Labs Lab 06/01/15 0515 06/03/15 0605  WBC 11.8* 10.4  HGB 11.8* 11.6*  HCT 37.6* 37.9*  MCV 84.5 85.0  PLT 315 351   Basic Metabolic Panel:   Recent Labs Lab 05/27/15 1935  06/01/15 0515 06/03/15 0605  NA 138  < > 136 138  K 4.4  < > 4.7 3.6  CL 108  < > 101 93*  CO2 22  < > 27 34*  GLUCOSE 272*  < > 198* 228*  BUN 12  < > 15 27*  CREATININE 1.72*  < > 1.71* 2.19*  CALCIUM 7.9*  < > 7.9* 8.5*  MG 1.6*  --   --  1.8  PHOS  --   --   --  4.1  < > = values in this interval not displayed.  Lipid Panel:     Component Value Date/Time   CHOL 264* 05/24/2015 0357   TRIG 271* 05/24/2015 0357   HDL 30* 05/24/2015 0357   CHOLHDL 8.8 05/24/2015 0357   VLDL 54* 05/24/2015 0357   LDLCALC 180* 05/24/2015 0357   HgbA1c:  Lab Results  Component Value Date   HGBA1C 12.8* 05/24/2015    IMAGING I have personally reviewed the radiological images below and agree with the radiology interpretations.  CT Head Wo Contrast 05/22/2015   1. Subacute nonhemorrhagic infarction high in the right parietal lobe with secondary edema of that portion of the brain without midline shift.  2. Multiple small scalp contusions.   CT head without contrast 05/28/2015 Continued normal expected interval evolution of moderate sized  right MCA infarct.  No hemorrhagic transformation or other complication identified.  No other new intracranial process.  Portable chest x-ray 1 view 06/03/2015 Persistent postoperative change with soft tissue air in the right supraclavicular region. Mild bibasilar atelectatic change. No edema or consolidation. Stable cardiac prominence. 05/31/2015 1. Left jugular catheter terminates in the region of the left brachiocephalic vein. 2. Hypoinflation with left basilar atelectasis versus consolidation.  Mr Maxine GlennMra Head Wo Contrast 05/23/2015   Motion degraded. No emergent large vessel occlusion to correspond with the acute posterior right MCA infarct.   Mr Brain Wo Contrast 05/23/2015   1. Acute posterior right MCA territory infarct corresponding to the abnormal CT appearance yesterday. Cytotoxic edema without intracranial mass effect at this time.  2. Intracranial MRA from today reported separately.  3. Moderately to severely motion degraded and the examination had to be discontinued prior to completion due to patient agitation.  4. Consider surveillance for associated intracranial hemorrhage and mass effect with noncontrast CT. (see above)  CT angiogram of the neck with and without contrast 05/30/2015 1. New High-grade Radiographic-String-Sign stenosis of the Right CCA at the level of the larynx since 2011. Bulky 17 mm Thrombus  distal to this stenosis adherent to the wall of the vessel at this time. 2. Surgery to the left carotid bifurcation since 2011 with restored patency of the left ICA origin. Progressed soft plaque in the left CCA resulting in stenosis of 50-60% at the level of the thyroid cartilage. 3. 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 No distal reconstituted flow. 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 4. Right vertebral artery progressed soft plaque in the V2 segment at the C3 level with new severe stenosis. Increased soft plaque in the V4 segment  with new moderate stenosis. 5. Layering pleural effusions, up to moderate on the left and small on the right. 6. Poor dentition. Chronic right mastoid inflammation.  US Renal 05/23/2015   1. No hydronephrosis.  2. Echogenic normal size kidneys, indicating nonspecific renal parenchymal disease of uncertain chronicity.  3. Mild diffuse bladder wall thickening and trabeculation, suggesting chronic bladder voiding dysfunction such as due to chronic bladder outlet obstruction by an enlarged prostate gland. The prostate gland is not evaluated on this scan.   US Carotid Bilateral 05/23/2015   Right: Nearly occlusive centralized thrombus/plaque within the right common carotid artery which contributes to post stenotic and decreased antegrade flow of the right ICA. This plaque is favored to represent acute embolism/thrombus given the morphology. Formal cerebral angiogram is recommended. As a second best test, CTA. Left: The patient is status post left carotid endarterectomy in September 2011, with atherosclerotic changes of the common carotid artery. Established duplex criteria have not been validated for assessment after surgery, however, there is no evidence of compromised flow. High diastolic component of the flow on the left suggests increase contribution, potentially to the right hemisphere given the small infarction on recent CT.   EEG - This is an abnormal EEG demonstrating posterior slowing on the right compared to that of the left, consistent with the patient's history of a new right PCA infarct. There was no epileptiform activity recorded on this recording.  2D echo - Upper normal LV chamber size with mild LVH and LVEF approximately 35%. There is diffuse hypokinesis with akinesis of the basal inferolateral/inferior wall. Grade 1 diastolic dysfunction with indeterminate filling pressure. Upper normal left atrial chamber size. Trivial mitral regurgitation. No obvious PFO or ASD.   PHYSICAL  EXAM  Temp:  [97.5 F (36.4 C)-99 F (37.2 C)] 98.7 F (37.1 C) (03/27 1507) Pulse Rate:  [78-99] 87 (03/27 1502) Resp:  [12-19] 19 (03/27 1502) BP: (121-144)/(63-70) 141/70 mmHg (03/27 1502) SpO2:  [92 %-96 %] 94 % (03/27 1502) Weight:  [166 lb 7.2 oz (75.5 kg)] 166 lb 7.2 oz (75.5 kg) (03/27 0358)  General - cachetic looking, well developed, in no apparent distress. Cardiovascular - Regular rate and rhythm. Chest:  CTA Abd:  Soft NT, ND normal bowel sounds Extremities:  Left AKA and right BKA  Mental Status -  Lethargic however, orientation to year (not month), place, and person were intact. Language including expression, naming, repetition, comprehension was assessed and found intact.  Cranial Nerves II - XII - II -  . Does not blink to threat bilaterally and poor effort on exam when testing EOM. But lateral gaze bilaterally  intact when I move to each side  III, IV, VI - lateral gaze intact  V - Facial sensation intact bilaterally. VII - Facial movement intact bilaterally. VIII - Hearing & vestibular intact bilaterally. X - Palate elevates symmetrically. XI - Chin turning & shoulder shrug intact bilaterally. XII - Tongue  protrusion intact.  Motor Strength - The patient's strength was 0/5 LUE and left leg stump, 5/5 RUE and right leg stump, bilateral AKA.  Bulk was normal and fasciculations were absent.   Motor Tone - Muscle tone was assessed at the neck and appendages and was normal.  Sensory - Light touch, temperature/pinprick were assessed and were symmetrical.    Coordination - The patient had normal movements in the right hand with no ataxia or dysmetria.    Gait and Station - not able to test.   ASSESSMENT/PLAN Ian Garcia is a 60 y.o. male with history of DM, seizures, htn, previous stroke, hld, and carotid stenosis presenting with acute onset of left-sided weakness. He did not receive IV t-PA due to late presentation.  Stroke:  Non-dominant right MCA  territory infarct thomboembolic secondary to large vessel disease from near right carotid artery occlusion  Resultant  Left hemiplegia  MRI  Acute posterior right MCA territory infarct with cytotoxic edema  MRA  No emergent large vessel occlusion to correspond with the acute posterior right MCA infarct.   Carotid Doppler - near occlusion of right carotid artery.   CTA of neck - New High-grade Radiographic-String-Sign stenosis of the Right CCA with thrombus.  2D Echo - EF 35%.  No PFO. No cardiac source of emboli identified.  LDL - 180  HgbA1c 12.6  VTE prophylaxis - subcutaneous heparin Diet NPO time specified Given coughing with liquids. Change to NPO and asked ST to reassess. Added NS at 75 cc/hr.  clopidogrel 75 mg daily prior to admission, put on aspirin 325 mg daily and clopidogrel 75 mg daily. Due to NPO, put on suppository ASA.   Patient counseled to be compliant with his antithrombotic medications  Ongoing aggressive stroke risk factor management  Therapy recommendations:  SNF  Disposition: Pending  Right ICA near occlusion  Cause of right MCA infarct  BP goal 130-150  /P Right CEA 05/31/15 and right subclavian to carotid bypass with excision of a supraclavicular mass pending pathology  Hypotension, resolved  Had been treated with IV fluids and dopamine  EF 35%  Stable on IVF   Cardiomyopathy and chronicSystolic CHF  EF 16%  Low BP  Cardiology following - appreciate assistance.  Continue coreg  No ACEI or ARB due to renal function  Lasix  daily  Dysphagia  NPO now  Speech re-eval  Change po meds to IV  Dehydration   Elevated creatinine 1.71 -> 2.19, Bun 15->27.   Added IVF  Lasix decreased by one half  ? Seizure  Hx of seizure on keppra   Reported event of left arm shaking treated with ativan  EEG showed right slowing, no seizure  Continue keppra  bid - given NPO, changed to IV route  Hyperlipidemia  Home meds:   Lipitor 80 mg daily resumed in hospital  LDL 180, goal < 70  Consider adding Zetia.  Continue statin at discharge  Diabetes  HgbA1c 12.6, goal < 7.0  Uncontrolled  Noncompliance  Hold Lantus for now giving NPO status. We'll reassess in a.m. (Diabetic RN recommended 15 u Lantus daily)  Tobacco abuse  Current smoker  Smoking cessation counseling provided  Pt is NOT willing to quit  Other Stroke Risk Factors  Hx stroke/TIA  S/p left CEA  Other Active Problems  Urinary retention. Foley catheter placed.  Hyperkalemia 5.2 - repeat Monday -> 3.6  Urine culture - 10,000 colonies ENTEROCOCCUS SPECIES  Blood cultures  - no growth x 5  days  B/L AKA  Mild anemia, 11.6  Mild leukocytosis, resolved 10.4   Poor medical compliance  S/P Right CEA 05/31/15 with excision of a supraclavicular mass -> pathology pending.   Hospital day # 12   Pt had new development of dysphagia, worsening renal function, and dehydration with urinary retention. The patient is at risk for recurrent strokes and TIAs, neurological worsening, medication adjustment and he needs ongoing extensive care.  Marvel Plan, MD PhD Stroke Neurology 06/03/2015 6:53 PM   To contact Stroke Continuity provider, please refer to [email or web address]. After hours, contact General Neurology

## 2015-06-04 ENCOUNTER — Inpatient Hospital Stay (HOSPITAL_COMMUNITY): Payer: Non-veteran care

## 2015-06-04 DIAGNOSIS — Z89511 Acquired absence of right leg below knee: Secondary | ICD-10-CM

## 2015-06-04 DIAGNOSIS — Z89512 Acquired absence of left leg below knee: Secondary | ICD-10-CM

## 2015-06-04 LAB — GLUCOSE, CAPILLARY
Glucose-Capillary: 129 mg/dL — ABNORMAL HIGH (ref 65–99)
Glucose-Capillary: 153 mg/dL — ABNORMAL HIGH (ref 65–99)
Glucose-Capillary: 202 mg/dL — ABNORMAL HIGH (ref 65–99)
Glucose-Capillary: 164 mg/dL — ABNORMAL HIGH (ref 65–99)
Glucose-Capillary: 219 mg/dL — ABNORMAL HIGH (ref 65–99)

## 2015-06-04 LAB — BASIC METABOLIC PANEL
Anion gap: 9 (ref 5–15)
BUN: 26 mg/dL — AB (ref 6–20)
CHLORIDE: 101 mmol/L (ref 101–111)
CO2: 31 mmol/L (ref 22–32)
CREATININE: 1.99 mg/dL — AB (ref 0.61–1.24)
Calcium: 8.2 mg/dL — ABNORMAL LOW (ref 8.9–10.3)
GFR calc Af Amer: 41 mL/min — ABNORMAL LOW (ref >60)
GFR calc non Af Amer: 35 mL/min — ABNORMAL LOW (ref >60)
Glucose, Bld: 164 mg/dL — ABNORMAL HIGH (ref 65–99)
Potassium: 3.8 mmol/L (ref 3.5–5.1)
SODIUM: 141 mmol/L (ref 135–145)

## 2015-06-04 LAB — CBC
HCT: 36.2 % — ABNORMAL LOW (ref 39.0–52.0)
Hemoglobin: 10.9 g/dL — ABNORMAL LOW (ref 13.0–17.0)
MCH: 26.1 pg (ref 26.0–34.0)
MCHC: 30.1 g/dL (ref 30.0–36.0)
MCV: 86.6 fL (ref 78.0–100.0)
PLATELETS: 376 10*3/uL (ref 150–400)
RBC: 4.18 MIL/uL — ABNORMAL LOW (ref 4.22–5.81)
RDW: 13.7 % (ref 11.5–15.5)
WBC: 10 10*3/uL (ref 4.0–10.5)

## 2015-06-04 MED ORDER — CARVEDILOL 6.25 MG PO TABS
6.2500 mg | ORAL_TABLET | Freq: Two times a day (BID) | ORAL | Status: DC
  Administered 2015-06-04 – 2015-06-13 (×16): 6.25 mg via ORAL
  Filled 2015-06-04 (×17): qty 1

## 2015-06-04 MED ORDER — RESOURCE THICKENUP CLEAR PO POWD
ORAL | Status: DC | PRN
  Filled 2015-06-04 (×2): qty 125

## 2015-06-04 MED ORDER — FUROSEMIDE 20 MG PO TABS
20.0000 mg | ORAL_TABLET | Freq: Every day | ORAL | Status: DC
  Administered 2015-06-05 – 2015-06-13 (×9): 20 mg via ORAL
  Filled 2015-06-04 (×9): qty 1

## 2015-06-04 NOTE — Progress Notes (Addendum)
[facility]  Subjective  - Neck is sore.  Objective 137/71 87 98.3 F (36.8 C) (Oral) 12 97%  Intake/Output Summary (Last 24 hours) at 06/04/15 0719 Last data filed at 06/04/15 0700  Gross per 24 hour  Intake   1685 ml  Output    925 ml  Net    760 ml   Palpable radial pulses equal bilaterally Incision healing well Smile symmetric and no tongue deviation  Assessment/Planning: POD # 4 Right carotid subclavian by pass/ R CEA  Stable disposition  Lemus GallantCOLLINS, EMMA [facility] 06/04/2015 7:19 AM --  Laboratory Lab Results:  Recent Labs  06/03/15 0605 06/04/15 0500  WBC 10.4 10.0  HGB 11.6* 10.9*  HCT 37.9* 36.2*  PLT 351 376   BMET  Recent Labs  06/03/15 0605 06/04/15 0500  NA 138 141  K 3.6 3.8  CL 93* 101  CO2 34* 31  GLUCOSE 228* 164*  BUN 27* 26*  CREATININE 2.19* 1.99*  CALCIUM 8.5* 8.2*    COAG Lab Results  Component Value Date   INR 1.02 11/19/2009   No results found for: PTT  Agree with above. Vascular will be available as needed.   Waverly Ferrarihristopher Dickson, MD, FACS Beeper [phone] Office: [phone]

## 2015-06-04 NOTE — Progress Notes (Signed)
Cardiologist: Dr. Rennis Golden ([facility]) Subjective:  No complaints, no SOB, no CP. Comfortable   Objective:  Vital Signs in the last 24 hours: Temp:  [98.1 F (36.7 C)-99 F (37.2 C)] 98.3 F (36.8 C) (03/28 0645) Pulse Rate:  [78-90] 78 (03/28 0920) Resp:  [10-21] 10 (03/28 0920) BP: (122-158)/(63-74) 158/74 mmHg (03/28 0920) SpO2:  [90 %-98 %] 98 % (03/28 0920) Weight:  [165 lb 12.6 oz (75.2 kg)] 165 lb 12.6 oz (75.2 kg) (03/28 0251)  Intake/Output from previous day: 03/27 0701 - 03/28 0700 In: 1685 [I.V.:1465; IV Piggyback:220] Out: 925 [Urine:925]   Physical Exam: General: Well developed, well nourished, in no acute distress. Head:  Normocephalic and atraumatic. Lungs: Clear to auscultation and percussion. Heart: Normal S1 and S2.  No murmur, rubs or gallops.  Abdomen: soft, non-tender, positive bowel sounds. Extremities: No clubbing or cyanosis. No edema. Amputations noted Neurologic: Alert and oriented x 3.    Lab Results:  Recent Labs  06/03/15 0605 06/04/15 0500  WBC 10.4 10.0  HGB 11.6* 10.9*  PLT 351 376    Recent Labs  06/03/15 0605 06/04/15 0500  NA 138 141  K 3.6 3.8  CL 93* 101  CO2 34* 31  GLUCOSE 228* 164*  BUN 27* 26*  CREATININE 2.19* 1.99*    Imaging: Dg Chest Port 1 View  06/03/2015  CLINICAL DATA:  Recent CVA EXAM: PORTABLE CHEST 1 VIEW COMPARISON:  May 31, 2015 FINDINGS: Central catheter tip is in the left innominate vein. No pneumothorax. There is mild bibasilar atelectatic change. Lungs elsewhere clear. Heart is slightly enlarged with pulmonary vascularity within normal limits. No adenopathy. Is postoperative change in the right supraclavicular region with soft tissue air in the supraclavicular region, stable. IMPRESSION: Persistent postoperative change with soft tissue air in the right supraclavicular region. Mild bibasilar atelectatic change. No edema or consolidation. Stable cardiac prominence. Electronically Signed   By:  Bretta Bang III M.D.   On: 06/03/2015 07:48   Personally viewed.   Telemetry: no adverse rhythms Personally viewed.  Cardiac Studies:  EF 35%.   Meds: Scheduled Meds: .  stroke: mapping our early stages of recovery book   Does not apply Once  . amitriptyline  25 mg Oral QHS  . aspirin  325 mg Oral Daily  . atorvastatin  80 mg Oral q1800  . carvedilol  3.125 mg Oral BID WC  . cholecalciferol  800 Units Oral Daily  . clopidogrel  75 mg Oral Daily  . docusate sodium  100 mg Oral Daily  . furosemide  40 mg Intravenous Daily  . gabapentin  900 mg Oral BID  . heparin  5,000 Units Subcutaneous 3 times per day  . insulin aspart  0-15 Units Subcutaneous 6 times per day  . levETIRAcetam  1,000 mg Intravenous Q12H  . magnesium oxide  800 mg Oral Daily  . pantoprazole (PROTONIX) IV  40 mg Intravenous Q24H   Continuous Infusions: . sodium chloride 75 mL/hr at 06/04/15 0700   PRN Meds:.acetaminophen, alum & mag hydroxide-simeth, guaiFENesin-dextromethorphan, hydrALAZINE, iohexol, labetalol, magnesium sulfate 1 - 4 g bolus IVPB, metoprolol, morphine injection, ondansetron, oxyCODONE, phenol, potassium chloride, senna-docusate  Assessment/Plan:  Principal Problem:   Acute ischemic stroke (HCC) Active Problems:   Type II diabetes mellitus with complication, uncontrolled (HCC)   Peripheral vascular disease due to secondary diabetes mellitus (HCC)   Seizure disorder (HCC)   CVA (cerebral infarction)   Acute kidney injury (HCC)   Below knee amputation status (  HCC)   Tobacco abuse   Cerebral infarction due to unspecified mechanism   S/P AKA (above knee amputation) bilateral (HCC)   Cardiomyopathy (HCC)   Right middle cerebral artery stroke (HCC)   Acute systolic CHF (congestive heart failure), NYHA class 3 (HCC)   Carotid stenosis   Dysphagia, pharyngoesophageal phase   Dehydration   Right carotid endarterectomy and right subclavian to carotid bypass - Dr. Edilia Boickson.  -Doing well  post op  Chronic systolic HF -restarting low dose lasix 20mg  PO QD for maintanance (creat increased) -EF 35% (?chronicity of this) -continue coreg, increased to 6.25bid -no ACE-I because of renal function -appears comfortable from cardiac perspective.  Will sign off. Please call if any ?    SKAINS, MARK 06/04/2015, 11:12 AM

## 2015-06-04 NOTE — Progress Notes (Signed)
Clinical/Bedside Swallow Evaluation Patient Details  Name: FINLEE MILO MRN: [id number] Date of Birth: [birth year]  Today's Date: 06/04/2015 Time: SLP Start Time (ACUTE ONLY): 4098 SLP Stop Time (ACUTE ONLY): 0820 SLP Time Calculation (min) (ACUTE ONLY): 8 min  Past Medical History:  Past Medical History  Diagnosis Date  . Diabetes mellitus (HCC)   . Diabetic neuropathy (HCC)   . Seizures (HCC)   . Hypertension   . Acid reflux   . Hypercholesteremia   . Peripheral vascular disease due to secondary diabetes mellitus (HCC)   . CVA (cerebral infarction)   . Carotid stenosis     s/p CEA on the left  . Glaucoma    Past Surgical History:  Past Surgical History  Procedure Laterality Date  . Eye surgery    . Left femoral to tibial bypass  03/16/2011    [facility]  . Amputation left great toe  02/2012    [facility]  . Cea      On the left  . Endarterectomy Right 05/31/2015    Procedure: RIGHT CAROTID ENDARTERECTOMY ;  Surgeon: Chuck Hint, MD;  Location: [facility] OR;  Service: Vascular;  Laterality: Right;  . Carotid-subclavian bypass graft Right 05/31/2015    Procedure: RIGHT BYPASS GRAFT CAROTID-SUBCLAVIAN - USING HEMASHIELD GOLD BY 30 CM GRAFT;  Surgeon: Chuck Hint, MD;  Location: [facility] OR;  Service: Vascular;  Laterality: Right;   HPI:  60 y.o. male with h/o DM, diabetic neuropathy, seizures, HTN, acid reflux, hypercholesteremia, peripheral vascular disease due to secondary DM, CVA, carotid stenosis s/p CEA on L and tobacco abuse, who presented to ED with acute L side weakness since 3/14. MR Brain 3/16 acute posterior R MCA territory infarct. MRA Head 3/16 no emergent large vessel occlusion to correspond with acute posterior R MCA infarct.   Assessment / Plan / Recommendation Clinical Impression  Per previous RN report, pt had difficulty swallowing thin liquid via straw yesterday morning therefore SLP consulted for bedside swallow evaluation. Current  RN reported decreased management of secretions and coughing throughout the night. Pt exhibited wet vocal quality and reduced vocal intensity after intake of thin liquids via cup. Immediate coughing observed when pt challenged with 3 oz. water test. SLP provided mod verbal and tactile cues to take small sips/bites during intake due to pt's impulsivity. Pt educated re: need for instrumental testing. SLP will perform MBS to r/o penetration/aspiration and determine least restrictive diet.    Aspiration Risk  Moderate aspiration risk    Diet Recommendation NPO   Medication Administration: Via alternative means    Other  Recommendations Oral Care Recommendations: Oral care QID   Follow up Recommendations  Skilled Nursing facility    Frequency and Duration            Prognosis Prognosis for Safe Diet Advancement: Good Barriers to Reach Goals: Cognitive deficits      Swallow Study   General HPI: 60 y.o. male with h/o DM, diabetic neuropathy, seizures, HTN, acid reflux, hypercholesteremia, peripheral vascular disease due to secondary DM, CVA, carotid stenosis s/p CEA on L and tobacco abuse, who presented to ED with acute L side weakness since 3/14. MR Brain 3/16 acute posterior R MCA territory infarct. MRA Head 3/16 no emergent large vessel occlusion to correspond with acute posterior R MCA infarct. Type of Study: Bedside Swallow Evaluation Previous Swallow Assessment: none found Diet Prior to this Study: NPO Temperature Spikes Noted: No Respiratory Status: Nasal cannula History of Recent  Intubation: No Behavior/Cognition: Alert;Cooperative;Confused;Pleasant mood;Requires cueing Oral Cavity Assessment: Within Functional Limits Oral Care Completed by SLP: No Oral Cavity - Dentition: Edentulous;Dentures, not available Vision: Functional for self-feeding Self-Feeding Abilities: Able to feed self;Needs assist;Needs set up Patient Positioning: Upright in bed Baseline Vocal Quality:  Hoarse Volitional Cough: Weak;Congested Volitional Swallow: Able to elicit    Oral/Motor/Sensory Function Overall Oral Motor/Sensory Function: Within functional limits   Ice Chips Ice chips: Not tested   Thin Liquid Thin Liquid: Impaired Presentation: Cup;Self Fed;Straw Oral Phase Impairments:  (none) Oral Phase Functional Implications:  (none) Pharyngeal  Phase Impairments: Multiple swallows;Wet Vocal Quality;Cough - Immediate    Nectar Thick Nectar Thick Liquid: Not tested   Honey Thick Honey Thick Liquid: Not tested   Puree Puree: Within functional limits   Solid   GO   Solid: Within functional limits        Julane Crock 06/04/2015,9:07 AM   Lynita LombardLauren Jarrod Bodkins, Student-SLP

## 2015-06-04 NOTE — Progress Notes (Signed)
Occupational Therapy Treatment Patient Details Name: Ian Garcia MRN: [id number] DOB: [birth year] Today's Date: 06/04/2015    History of present illness Patient is a 60 yo male admitted 05/22/15 with Lt sided weakness and decreased sensation.  MRI showed acute Rt MCA infarct.  Patient also with thrombus in Rt ICA.    Pt is now s/p Rt carotid endarterectomy and Rt subclavian to carotid bypass.  PMH:  DM, neuropathy, PVD, bilateral AKA's, HTN, seizure disorder, CVA, carotid stenosis, tobacco use, CHF with EF 35%   OT comments  Pt tolerated eob for 10 minutes and with zero sitting balance. Pt worked on neck rotation this session to help facilitate body alignment.    Follow Up Recommendations  SNF    Equipment Recommendations  None recommended by OT    Recommendations for Other Services      Precautions / Restrictions Precautions Precautions: Fall Precaution Comments: poor spacital awareness/ bil amputation Restrictions Weight Bearing Restrictions: No       Mobility Bed Mobility Overal bed mobility: Needs Assistance;+2 for physical assistance Bed Mobility: Supine to Sit;Sit to Supine     Supine to sit: Total assist;+2 for physical assistance;HOB elevated Sit to supine: Total assist;+2 for physical assistance   General bed mobility comments: pt reaching with R UE hand over hand for bed rail to (A) with static sitting.   Transfers                 General transfer comment: recommend use of lift for OOB with staff    Balance Overall balance assessment: Needs assistance Sitting-balance support: Single extremity supported;Feet unsupported Sitting balance-Leahy Scale: Zero Sitting balance - Comments: Pt with posterior lean , R neck rotation and pushing with R UE when placed on surface                           ADL Overall ADL's : Needs assistance/impaired                                       General ADL Comments: Pt required total +2 to  sit EOB. pt needed cues to reach for R bed rail . pt immediate LOB to the L without bed rail. pt unable to sustain side lying on R elbow.       Vision                     Perception     Praxis      Cognition   Behavior During Therapy: Flat affect Overall Cognitive Status: Impaired/Different from baseline Area of Impairment: Orientation;Attention;Safety/judgement;Awareness;Problem solving Orientation Level: Disoriented to;Time;Situation Current Attention Level: Sustained      Safety/Judgement: Decreased awareness of safety;Decreased awareness of deficits Awareness: Intellectual Problem Solving: Slow processing;Decreased initiation;Difficulty sequencing;Requires verbal cues;Requires tactile cues General Comments: L inattention    Extremity/Trunk Assessment               Exercises Other Exercises Other Exercises: L shoulder no active movement noted. shoulder remains intact at this time. No subluxation noted Other Exercises: neck rotation to the L side and sustained hold  Other Exercises: pt demonstrates visual deficits with reading    Shoulder Instructions       General Comments      Pertinent Vitals/ Pain       Pain Assessment: No/denies pain  Home Living  Prior Functioning/Environment              Frequency Min 2X/week     Progress Toward Goals  OT Goals(current goals can now be found in the care plan section)  Progress towards OT goals: Progressing toward goals  Acute Rehab OT Goals Patient Stated Goal: none stated OT Goal Formulation: With patient Time For Goal Achievement: 06/09/15 Potential to Achieve Goals: Good ADL Goals Additional ADL Goal #1: Pt will be able to roll left with min A to A with basic ADLs at bed level Additional ADL Goal #2: Pt will be able to sit EOB with no more than Mod A for 5 minutes Additional ADL Goal #3: Pt will be able to locate items to his left  with with min cues to find them Additional ADL Goal #4: pt will initiate looking at LUE at least once a session without cues and he will wash hands/apply lotion with set up/min A  to open containters and wring out cloth  Plan Discharge plan remains appropriate    Co-evaluation                 End of Session     Activity Tolerance Patient tolerated treatment well   Patient Left in bed;with bed alarm set;with call bell/phone within reach   Nurse Communication Mobility status;Precautions        Time: 6962-9528 OT Time Calculation (min): 16 min  Charges: OT General Charges $OT Visit: 1 Procedure OT Treatments $Therapeutic Activity: 8-22 mins  Boone Master B 06/04/2015, 12:49 PM   Mateo Flow   OTR/L Pager: [phone] Office: [phone] .

## 2015-06-04 NOTE — Progress Notes (Signed)
MBSS complete. Full report located under chart review in imaging section. Neo Yepiz, MA CCC-SLP [phone]  

## 2015-06-04 NOTE — Care Management Note (Signed)
Case Management Note  Patient Details  Name: Nuala AlphaBruce L Pugh MRN: [id number] Date of Birth: [birth year]  Subjective/Objective:  Patient with R MCA infarct and Trhombus in R ICA.  POD 4 R carotid endarterectomy and r subclavina to carrotid bypass.  Developed new dysphagia, lethargic, worsening renal function, dehydration with urinary retention.  Npo for swallow  Re eval. Wheelchair bound, bil bka, 2 plus asst. Pt rec SNF, CSW following.                   Action/Plan:   Expected Discharge Date:                  Expected Discharge Plan:  Skilled Nursing Facility  In-House Referral:  Clinical Social Work  Discharge planning Services  CM Consult  Post Acute Care Choice:    Choice offered to:     DME Arranged:    DME Agency:     HH Arranged:    HH Agency:     Status of Service:  Completed, signed off  Medicare Important Message Given:    Date Medicare IM Given:    Medicare IM give by:    Date Additional Medicare IM Given:    Additional Medicare Important Message give by:     If discussed at Long Length of Stay Meetings, dates discussed:    Additional Comments:  Leone Havenaylor, Katelyn Kohlmeyer Dimitroff, RN 06/04/2015, 5:16 PM

## 2015-06-04 NOTE — Progress Notes (Signed)
STROKE TEAM PROGRESS NOTE   SUBJECTIVE (INTERVAL HISTORY) Just back from Ba swallow - failed, alternative means recommended. VVS ok with transfer to floor. On IVF, Cre getting better. Pt refused NG tube but OK with PEG if needed.   OBJECTIVE Temp:  [98.1 F (36.7 C)-98.7 F (37.1 C)] 98.6 F (37 C) (03/28 1116) Pulse Rate:  [78-90] 78 (03/28 1115) Cardiac Rhythm:  [-] Normal sinus rhythm (03/28 1125) Resp:  [8-21] 8 (03/28 1115) BP: (123-158)/(66-74) 132/70 mmHg (03/28 1115) SpO2:  [90 %-98 %] 92 % (03/28 1115) Weight:  [165 lb 12.6 oz (75.2 kg)] 165 lb 12.6 oz (75.2 kg) (03/28 0251)  CBC:   Recent Labs Lab 06/03/15 0605 06/04/15 0500  WBC 10.4 10.0  HGB 11.6* 10.9*  HCT 37.9* 36.2*  MCV 85.0 86.6  PLT 351 376   Basic Metabolic Panel:   Recent Labs Lab 06/03/15 0605 06/04/15 0500  NA 138 141  K 3.6 3.8  CL 93* 101  CO2 34* 31  GLUCOSE 228* 164*  BUN 27* 26*  CREATININE 2.19* 1.99*  CALCIUM 8.5* 8.2*  MG 1.8  --   PHOS 4.1  --     Lipid Panel:     Component Value Date/Time   CHOL 264* 05/24/2015 0357   TRIG 271* 05/24/2015 0357   HDL 30* 05/24/2015 0357   CHOLHDL 8.8 05/24/2015 0357   VLDL 54* 05/24/2015 0357   LDLCALC 180* 05/24/2015 0357   HgbA1c:  Lab Results  Component Value Date   HGBA1C 12.8* 05/24/2015    IMAGING I have personally reviewed the radiological images below and agree with the radiology interpretations.  CT Head Wo Contrast 05/22/2015   1. Subacute nonhemorrhagic infarction high in the right parietal lobe with secondary edema of that portion of the brain without midline shift.  2. Multiple small scalp contusions.   CT head without contrast 05/28/2015 Continued normal expected interval evolution of moderate sized right MCA infarct.  No hemorrhagic transformation or other complication identified.  No other new intracranial process.  Portable chest x-ray 1 view 06/03/2015 Persistent postoperative change with soft tissue air  in the right supraclavicular region. Mild bibasilar atelectatic change. No edema or consolidation. Stable cardiac prominence. 05/31/2015 1. Left jugular catheter terminates in the region of the left brachiocephalic vein. 2. Hypoinflation with left basilar atelectasis versus consolidation.  Mr Maxine GlennMra Head Wo Contrast 05/23/2015   Motion degraded. No emergent large vessel occlusion to correspond with the acute posterior right MCA infarct.   Mr Brain Wo Contrast 05/23/2015   1. Acute posterior right MCA territory infarct corresponding to the abnormal CT appearance yesterday. Cytotoxic edema without intracranial mass effect at this time.  2. Intracranial MRA from today reported separately.  3. Moderately to severely motion degraded and the examination had to be discontinued prior to completion due to patient agitation.  4. Consider surveillance for associated intracranial hemorrhage and mass effect with noncontrast CT. (see above)  CT angiogram of the neck with and without contrast 05/30/2015 1. New High-grade Radiographic-String-Sign stenosis of the Right CCA at the level of the larynx since 2011. Bulky 17 mm Thrombus distal to this stenosis adherent to the wall of the vessel at this time. 2. Surgery to the left carotid bifurcation since 2011 with restored patency of the left ICA origin. Progressed soft plaque in the left CCA resulting in stenosis of 50-60% at the level of the thyroid cartilage. 3. 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 No distal reconstituted flow.  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 4. Right vertebral artery progressed soft plaque in the V2 segment at the C3 level with new severe stenosis. Increased soft plaque in the V4 segment with new moderate stenosis. 5. Layering pleural effusions, up to moderate on the left and small on the right. 6. Poor dentition. Chronic right mastoid inflammation.  US Renal 05/23/2015   1. No hydronephrosis.  2.  Echogenic normal size kidneys, indicating nonspecific renal parenchymal disease of uncertain chronicity.  3. Mild diffuse bladder wall thickening and trabeculation, suggesting chronic bladder voiding dysfunction such as due to chronic bladder outlet obstruction by an enlarged prostate gland. The prostate gland is not evaluated on this scan.   US Carotid Bilateral 05/23/2015   Right: Nearly occlusive centralized thrombus/plaque within the right common carotid artery which contributes to post stenotic and decreased antegrade flow of the right ICA. This plaque is favored to represent acute embolism/thrombus given the morphology. Formal cerebral angiogram is recommended. As a second best test, CTA. Left: The patient is status post left carotid endarterectomy in September 2011, with atherosclerotic changes of the common carotid artery. Established duplex criteria have not been validated for assessment after surgery, however, there is no evidence of compromised flow. High diastolic component of the flow on the left suggests increase contribution, potentially to the right hemisphere given the small infarction on recent CT.   EEG - This is an abnormal EEG demonstrating posterior slowing on the right compared to that of the left, consistent with the patient's history of a new right PCA infarct. There was no epileptiform activity recorded on this recording.  2D echo - Upper normal LV chamber size with mild LVH and LVEF approximately 35%. There is diffuse hypokinesis with akinesis of the basal inferolateral/inferior wall. Grade 1 diastolic dysfunction with indeterminate filling pressure. Upper normal left atrial chamber size. Trivial mitral regurgitation. No obvious PFO or ASD.   PHYSICAL EXAM Temp:  [98.1 F (36.7 C)-98.7 F (37.1 C)] 98.6 F (37 C) (03/28 1116) Pulse Rate:  [78-90] 78 (03/28 1115) Resp:  [8-21] 8 (03/28 1115) BP: (123-158)/(66-74) 132/70 mmHg (03/28 1115) SpO2:  [90 %-98 %] 92 % (03/28  1115) Weight:  [165 lb 12.6 oz (75.2 kg)] 165 lb 12.6 oz (75.2 kg) (03/28 0251)  General - cachetic looking, well developed, in no apparent distress. Cardiovascular - Regular rate and rhythm. Chest:  CTA Abd:  Soft NT, ND normal bowel sounds Extremities:  Left AKA and right BKA  Mental Status -  Awake alert, orientation to year (not month), place, and person were intact. Language including expression, naming, repetition, comprehension was assessed and found intact.  Cranial Nerves II - XII - II -  . Does not blink to threat bilaterally and poor effort on exam when testing EOM. But lateral gaze bilaterally  intact when I move to each side  III, IV, VI - lateral gaze intact  V - Facial sensation intact bilaterally. VII - Facial movement intact bilaterally. VIII - Hearing & vestibular intact bilaterally. X - Palate elevates symmetrically. XI - Chin turning & shoulder shrug intact bilaterally. XII - Tongue protrusion intact.  Motor Strength - The patient's strength was 0/5 LUE and left leg stump, 5/5 RUE and right leg stump, bilateral AKA.  Bulk was normal and fasciculations were absent.   Motor Tone - Muscle tone was assessed at the neck and appendages and was normal.  Sensory - Light touch, temperature/pinprick were assessed and were symmetrical.    Coordination -  The patient had normal movements in the right hand with no ataxia or dysmetria.    Gait and Station - not able to test.   ASSESSMENT/PLAN Mr. Ian Garcia is a 60 y.o. male with history of DM, seizures, htn, previous stroke, hld, and carotid stenosis presenting with acute onset of left-sided weakness. He did not receive IV t-PA due to late presentation.  Stroke:  Non-dominant right MCA territory infarct thomboembolic secondary to large vessel disease from near right carotid artery occlusion  Resultant  Left hemiplegia  MRI  Acute posterior right MCA territory infarct with cytotoxic edema  MRA  No emergent large  vessel occlusion to correspond with the acute posterior right MCA infarct.   Carotid Doppler - near occlusion of right carotid artery.   CTA of neck - New High-grade Radiographic-String-Sign stenosis of the Right CCA with thrombus.  2D Echo - EF 35%. Marland Kitchen  LDL - 180  HgbA1c 12.6  VTE prophylaxis - subcutaneous heparin DIET DYS 3 Room service appropriate?: Yes; Fluid consistency:: Pudding Thick   clopidogrel 75 mg daily prior to admission, put on aspirin 325 mg daily and clopidogrel 75 mg daily. Due to NPO, put on suppository ASA.   Patient counseled to be compliant with his antithrombotic medications  Ongoing aggressive stroke risk factor management  Therapy recommendations:  SNF  Disposition: Pending, transfer to floor  Right ICA near occlusion  Cause of right MCA infarct  BP goal 130-150  S/P Right CEA 05/31/15 and right subclavian to carotid bypass   Hypotension, resolved  Had been treated with IV fluids and dopamine  EF 35%  Stable on IVF   Cardiomyopathy and chronicSystolic CHF  EF 96%  Low BP  Cardiology following - appreciate assistance.  Continue coreg, increased to 6.25mg  bid  No ACEI or ARB due to renal function  Lasix  daily  Dysphagia  Change po meds to IV  MBS today will allow for a solid diet, no liquids.   Need long term solution for liquid  Pt refused NG tube, but OK with PEG if needed  Dehydration   Elevated creatinine 1.71 -> 2.19->1.99, Bun 15->27->26.   Added IVF  Lasix on hold due to NPO  ? Seizure  Hx of seizure on keppra   Reported event of left arm shaking treated with ativan  EEG showed right slowing, no seizure  Continue keppra  bid - given NPO, changed to IV route  Hyperlipidemia  Home meds:  Lipitor 80 mg daily resumed in hospital  LDL 180, goal < 70  Consider adding Zetia once po access.  Continue statin at discharge  Diabetes  HgbA1c 12.6, goal < 7.0  Uncontrolled  Noncompliance  Hold  Lantus for now giving NPO status.   Tobacco abuse  Current smoker  Smoking cessation counseling provided  Pt is NOT willing to quit  Other Stroke Risk Factors  Hx stroke/TIA  S/p left CEA  Other Active Problems  Urinary retention. Foley catheter placed.  Hyperkalemia 5.2 - repeat Monday -> 3.6->3.8  B/L AKA  Poor medical compliance  S/P Right CEA 05/31/15 with excision of a supraclavicular mass -> seborrheic keratosis.   Hospital day # 13   Marvel Plan, MD PhD Stroke Neurology 06/04/2015 2:44 PM    To contact Stroke Continuity provider, please refer to [email or web address]. After hours, contact General Neurology

## 2015-06-04 NOTE — Progress Notes (Signed)
Patient tranferred to 5C10 at 1630 from [facility]. Patient alert and oriented X4. Vital signs taken and charted. O2 and IVF infusing. Telemetry Box 10 applied and CCMD notified. Oriented to room with bed alarm on.

## 2015-06-04 NOTE — Progress Notes (Signed)
Speech Language Pathology Treatment: Cognitive-Linquistic  Patient Details Name: Ian Garcia MRN: [id number] DOB: 1955/09/15 Today's Date: 06/04/2015 Time: 0812-0820 SLP Time Calculation (min) (ACUTE ONLY): 8 min  Assessment / Plan / Recommendation Clinical Impression  Pt oriented x3 yet unable to determine situation despite max verbal cues. Pt required mod verbal and tactile cues to attend to staff and items on L side of visual field with persistent visual deficits noted on R side (difficulty determining buttons on remote when on R side of visual field). Intelligibility improved from last visit with this clinician (SLE dated 3/18). Pt required mod verbal cues to improve breath support to initiate phonation throughout session. Will continue to follow.   HPI HPI: 60 y.o. male with h/o DM, diabetic neuropathy, seizures, HTN, acid reflux, hypercholesteremia, peripheral vascular disease due to secondary DM, CVA, carotid stenosis s/p CEA on L and tobacco abuse, who presented to ED with acute L side weakness since 3/14. MR Brain 3/16 acute posterior R MCA territory infarct. MRA Head 3/16 no emergent large vessel occlusion to correspond with acute posterior R MCA infarct.      SLP Plan  Continue with current plan of care     Recommendations  Medication Administration: Via alternative means             Oral Care Recommendations: Oral care QID Follow up Recommendations: Skilled Nursing facility Plan: Continue with current plan of care     GO                Jalesia Loudenslager [facility] 06/04/2015, 9:08 AM  Lynita LombardLauren Bryttani Blew, Student-SLP

## 2015-06-05 ENCOUNTER — Inpatient Hospital Stay (HOSPITAL_COMMUNITY): Payer: Non-veteran care

## 2015-06-05 ENCOUNTER — Telehealth: Payer: Self-pay | Admitting: Vascular Surgery

## 2015-06-05 LAB — CBC
HEMATOCRIT: 36.5 % — AB (ref 39.0–52.0)
HEMOGLOBIN: 11.1 g/dL — AB (ref 13.0–17.0)
MCH: 26.1 pg (ref 26.0–34.0)
MCHC: 30.4 g/dL (ref 30.0–36.0)
MCV: 85.9 fL (ref 78.0–100.0)
Platelets: 378 10*3/uL (ref 150–400)
RBC: 4.25 MIL/uL (ref 4.22–5.81)
RDW: 13.5 % (ref 11.5–15.5)
WBC: 9.5 10*3/uL (ref 4.0–10.5)

## 2015-06-05 LAB — GLUCOSE, CAPILLARY
GLUCOSE-CAPILLARY: 153 mg/dL — AB (ref 65–99)
GLUCOSE-CAPILLARY: 197 mg/dL — AB (ref 65–99)
GLUCOSE-CAPILLARY: 234 mg/dL — AB (ref 65–99)
GLUCOSE-CAPILLARY: 237 mg/dL — AB (ref 65–99)
Glucose-Capillary: 180 mg/dL — ABNORMAL HIGH (ref 65–99)
Glucose-Capillary: 237 mg/dL — ABNORMAL HIGH (ref 65–99)

## 2015-06-05 LAB — BASIC METABOLIC PANEL
ANION GAP: 9 (ref 5–15)
BUN: 19 mg/dL (ref 6–20)
CHLORIDE: 103 mmol/L (ref 101–111)
CO2: 29 mmol/L (ref 22–32)
Calcium: 8.1 mg/dL — ABNORMAL LOW (ref 8.9–10.3)
Creatinine, Ser: 1.71 mg/dL — ABNORMAL HIGH (ref 0.61–1.24)
GFR calc Af Amer: 49 mL/min — ABNORMAL LOW (ref >60)
GFR calc non Af Amer: 42 mL/min — ABNORMAL LOW (ref >60)
GLUCOSE: 193 mg/dL — AB (ref 65–99)
POTASSIUM: 4.1 mmol/L (ref 3.5–5.1)
SODIUM: 141 mmol/L (ref 135–145)

## 2015-06-05 MED ORDER — PANTOPRAZOLE SODIUM 40 MG PO TBEC
40.0000 mg | DELAYED_RELEASE_TABLET | Freq: Every day | ORAL | Status: DC
  Administered 2015-06-05 – 2015-06-12 (×8): 40 mg via ORAL
  Filled 2015-06-05 (×8): qty 1

## 2015-06-05 MED ORDER — ACETAMINOPHEN 500 MG PO TABS: 1000.0000 mg | ORAL_TABLET | Freq: Four times a day (QID) | ORAL | Status: DC | PRN

## 2015-06-05 MED ORDER — LEVETIRACETAM 500 MG PO TABS: 1000.0000 mg | ORAL_TABLET | Freq: Two times a day (BID) | ORAL | Status: DC

## 2015-06-05 MED ORDER — LEVETIRACETAM 100 MG/ML PO SOLN
1000.0000 mg | Freq: Two times a day (BID) | ORAL | Status: DC
  Administered 2015-06-05 – 2015-06-13 (×17): 1000 mg via ORAL
  Filled 2015-06-05 (×26): qty 10

## 2015-06-05 NOTE — Progress Notes (Signed)
STROKE TEAM PROGRESS NOTE   SUBJECTIVE (INTERVAL HISTORY) RN reports patient with cough, difficulty getting up secretions. Patient thinks it is made worse due to him leaning over in the bed. Speech following with her and would like to do MBS tomorrow.    OBJECTIVE Temp:  [97.6 F (36.4 C)-99.5 F (37.5 C)] 98.6 F (37 C) (03/29 1018) Pulse Rate:  [73-81] 81 (03/29 1018) Cardiac Rhythm:  [-] Normal sinus rhythm (03/29 0700) Resp:  [13-17] 17 (03/29 1018) BP: (140-160)/(58-77) 158/70 mmHg (03/29 1018) SpO2:  [94 %-97 %] 97 % (03/29 1018) Weight:  [75.4 kg (166 lb 3.6 oz)] 75.4 kg (166 lb 3.6 oz) (03/29 0500)  CBC:   Recent Labs Lab 06/04/15 0500 06/05/15 0542  WBC 10.0 9.5  HGB 10.9* 11.1*  HCT 36.2* 36.5*  MCV 86.6 85.9  PLT 376 378   Basic Metabolic Panel:   Recent Labs Lab 06/03/15 0605 06/04/15 0500 06/05/15 0542  NA 138 141 141  K 3.6 3.8 4.1  CL 93* 101 103  CO2 34* 31 29  GLUCOSE 228* 164* 193*  BUN 27* 26* 19  CREATININE 2.19* 1.99* 1.71*  CALCIUM 8.5* 8.2* 8.1*  MG 1.8  --   --   PHOS 4.1  --   --     Lipid Panel:     Component Value Date/Time   CHOL 264* 05/24/2015 0357   TRIG 271* 05/24/2015 0357   HDL 30* 05/24/2015 0357   CHOLHDL 8.8 05/24/2015 0357   VLDL 54* 05/24/2015 0357   LDLCALC 180* 05/24/2015 0357   HgbA1c:  Lab Results  Component Value Date   HGBA1C 12.8* 05/24/2015    IMAGING I have personally reviewed the radiological images below and agree with the radiology interpretations.  CT Head Wo Contrast 05/22/2015   1. Subacute nonhemorrhagic infarction high in the right parietal lobe with secondary edema of that portion of the brain without midline shift.  2. Multiple small scalp contusions.   CT head without contrast 05/28/2015 Continued normal expected interval evolution of moderate sized right MCA infarct.  No hemorrhagic transformation or other complication identified.  No other new intracranial process.  Portable  chest x-ray 1 view 06/03/2015 Persistent postoperative change with soft tissue air in the right supraclavicular region. Mild bibasilar atelectatic change. No edema or consolidation. Stable cardiac prominence. 05/31/2015 1. Left jugular catheter terminates in the region of the left brachiocephalic vein. 2. Hypoinflation with left basilar atelectasis versus consolidation.  [name] Head Wo Contrast 05/23/2015   Motion degraded. No emergent large vessel occlusion to correspond with the acute posterior right MCA infarct.   Mr Brain Wo Contrast 05/23/2015   1. Acute posterior right MCA territory infarct corresponding to the abnormal CT appearance yesterday. Cytotoxic edema without intracranial mass effect at this time.  2. Intracranial MRA from today reported separately.  3. Moderately to severely motion degraded and the examination had to be discontinued prior to completion due to patient agitation.  4. Consider surveillance for associated intracranial hemorrhage and mass effect with noncontrast CT. (see above)  CT angiogram of the neck with and without contrast 05/30/2015 1. New High-grade Radiographic-String-Sign stenosis of the Right CCA at the level of the larynx since 2011. Bulky 17 mm Thrombus distal to this stenosis adherent to the wall of the vessel at this time. 2. Surgery to the left carotid bifurcation since 2011 with restored patency of the left ICA origin. Progressed soft plaque in the left CCA resulting in stenosis of 50-60% at  the level of the thyroid cartilage. 3. 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 No distal reconstituted flow. 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 4. Right vertebral artery progressed soft plaque in the V2 segment at the C3 level with new severe stenosis. Increased soft plaque in the V4 segment with new moderate stenosis. 5. Layering pleural effusions, up to moderate on the left and small on the right. 6. Poor dentition.  Chronic right mastoid inflammation.  US Renal 05/23/2015   1. No hydronephrosis.  2. Echogenic normal size kidneys, indicating nonspecific renal parenchymal disease of uncertain chronicity.  3. Mild diffuse bladder wall thickening and trabeculation, suggesting chronic bladder voiding dysfunction such as due to chronic bladder outlet obstruction by an enlarged prostate gland. The prostate gland is not evaluated on this scan.   US Carotid Bilateral 05/23/2015   Right: Nearly occlusive centralized thrombus/plaque within the right common carotid artery which contributes to post stenotic and decreased antegrade flow of the right ICA. This plaque is favored to represent acute embolism/thrombus given the morphology. Formal cerebral angiogram is recommended. As a second best test, CTA. Left: The patient is status post left carotid endarterectomy in September 2011, with atherosclerotic changes of the common carotid artery. Established duplex criteria have not been validated for assessment after surgery, however, there is no evidence of compromised flow. High diastolic component of the flow on the left suggests increase contribution, potentially to the right hemisphere given the small infarction on recent CT.   EEG - This is an abnormal EEG demonstrating posterior slowing on the right compared to that of the left, consistent with the patient's history of a new right PCA infarct. There was no epileptiform activity recorded on this recording.  2D echo - Upper normal LV chamber size with mild LVH and LVEF approximately 35%. There is diffuse hypokinesis with akinesis of the basal inferolateral/inferior wall. Grade 1 diastolic dysfunction with indeterminate filling pressure. Upper normal left atrial chamber size. Trivial mitral regurgitation. No obvious PFO or ASD.   PHYSICAL EXAM Temp:  [97.6 F (36.4 C)-99.5 F (37.5 C)] 98.6 F (37 C) (03/29 1018) Pulse Rate:  [73-81] 81 (03/29 1018) Resp:  [13-17] 17  (03/29 1018) BP: (140-160)/(58-77) 158/70 mmHg (03/29 1018) SpO2:  [94 %-97 %] 97 % (03/29 1018) Weight:  [75.4 kg (166 lb 3.6 oz)] 75.4 kg (166 lb 3.6 oz) (03/29 0500)  General - cachetic looking, well developed, in no apparent distress. Cardiovascular - Regular rate and rhythm. Chest:  CTA Abd:  Soft NT, ND normal bowel sounds Extremities:  Left AKA and right BKA  Mental Status -  Awake alert, orientation to year (not month), place, and person were intact. Language including expression, naming, repetition, comprehension was assessed and found intact.  Cranial Nerves II - XII - II -  . Does not blink to threat bilaterally and poor effort on exam when testing EOM. But lateral gaze bilaterally  intact when I move to each side  III, IV, VI - lateral gaze intact  V - Facial sensation intact bilaterally. VII - Facial movement intact bilaterally. VIII - Hearing & vestibular intact bilaterally. X - Palate elevates symmetrically. XI - Chin turning & shoulder shrug intact bilaterally. XII - Tongue protrusion intact.  Motor Strength - The patient's strength was 0/5 LUE and left leg stump, 5/5 RUE and right leg stump, bilateral AKA.  Bulk was normal and fasciculations were absent.   Motor Tone - Muscle tone was assessed at the neck and  appendages and was normal.  Sensory - Light touch, temperature/pinprick were assessed and were symmetrical.    Coordination - The patient had normal movements in the right hand with no ataxia or dysmetria.    Gait and Station - not able to test.   ASSESSMENT/PLAN Mr. ZANDYR BARNHILL is a 60 y.o. male with history of DM, seizures, htn, previous stroke, hld, and carotid stenosis presenting with acute onset of left-sided weakness. He did not receive IV t-PA due to late presentation.  Stroke:  Non-dominant right MCA territory infarct thomboembolic secondary to large vessel disease from near right carotid artery occlusion status post resultant right CEA this  admission  Resultant  Left hemiplegia, dysphagia  MRI  Acute posterior right MCA territory infarct with cytotoxic edema  MRA  No emergent large vessel occlusion to correspond with the acute posterior right MCA infarct.   Carotid Doppler - near occlusion of right carotid artery.   CTA of neck - New High-grade Radiographic-String-Sign stenosis of the Right CCA with thrombus.  2D Echo - EF 35%. Marland Kitchen  LDL - 180  HgbA1c 12.6  VTE prophylaxis - subcutaneous heparin DIET DYS 3 Room service appropriate?: Yes; Fluid consistency:: Pudding Thick high risk for aspiration - ST following  clopidogrel 75 mg daily prior to admission, now on aspirin 325 mg daily and clopidogrel 75 mg daily.  Patient counseled to be compliant with his antithrombotic medications  Ongoing aggressive stroke risk factor management  D/c central line  Therapy recommendations:  SNF  Disposition: Pending  Right ICA near occlusion  Cause of right MCA infarct  BP goal 130-150  S/P Right CEA 05/31/15 and right subclavian to carotid bypass   Postop course uneventful  Hypotension, resolved  Had been treated with IV fluids and dopamine  EF 35%   Cardiomyopathy and chronic Systolic CHF  EF 16%  Low BP  Cardiology following - appreciate assistance.  Continue coreg, increased to 6.25mg  bid  No ACEI or ARB due to renal function  Lasix  daily per cardiology  Dysphagia  Able to take po meds in puree  Pt refused NG tube, but OK with PEG if needed  Patient with cough with solids for lunch today - afebrile, WBC normal  ST at the bedside - per ST - downgrade to Dys 1  ST to reassess MBSS, likely tomorrow, hope can increase to honey thick liquids  Repeat CXR  Dehydration   Elevated creatinine 1.71 -> 2.19->1.99->1.71, Bun 15->27->26->19.   Added IVF  Lasix  daily  ? Seizure  Hx of seizure on keppra   Reported event of left arm shaking treated with ativan  EEG showed right slowing, no  seizure  Continue keppra  bid  Hyperlipidemia  Home meds:  Lipitor 80 mg daily resumed in hospital  LDL 180, goal < 70  Consider adding Zetia once po access.  Continue statin at discharge  Diabetes  HgbA1c 12.6, goal < 7.0  Uncontrolled  Noncompliance  Hold Lantus for now giving NPO status.   Tobacco abuse  Current smoker  Smoking cessation counseling provided  Pt is NOT willing to quit  Other Stroke Risk Factors  Hx stroke/TIA  S/p left CEA  Other Active Problems  Urinary retention. Foley catheter placed.  Hyperkalemia 5.2 - repeat Monday -> 3.6->3.8  B/L AKA  Poor medical compliance  S/P Right CEA 05/31/15 with excision of a supraclavicular mass -> seborrheic keratosis.  Hospital day # 14   Marvel Plan, MD PhD Stroke Neurology 06/05/2015  9:20 PM    To contact Stroke Continuity provider, please refer to [email or web address]. After hours, contact General Neurology

## 2015-06-05 NOTE — Progress Notes (Signed)
Speech Language Pathology Treatment: Dysphagia  Patient Details Name: Ian Garcia MRN: [id number] DOB: 12/16/1955 Today's Date: 06/05/2015 Time: 1210-1240 SLP Time Calculation (min) (ACUTE ONLY): 30 min  Assessment / Plan / Recommendation Clinical Impression  F/u after yesterday's MBS; pt had difficulty tolerating breakfast per staff; however, pt reports being fed quickly. He had open containers of Coke on bedside table and a cup of thin Coke with ice in his hand. Reviewed results of MBS and concerns for aspiration.  Assisted with lunch meal - dysphagia 3 textures more difficult for pt to effectively masticate today.  He required physical assist, multiple times, to achieve optimal positioning with head in neutral position and mod cues to maintain that posture (pt tends to hold head in extension.)  Pt able to feed himself purees and pudding thick liquids from spoon with mod verbal/tactile cues to maintain slow pace, small boluses.  He has poor insight into deficits and rationale for restrictive PO consistencies, despite repeated review.   Recommend downgrading diet to dysphagia 1; maintain pudding-thick liquids.  There is some discussion re: necessity of PEG.  Recommend repeating MBS next date to determine any ability to liberalize diet.  D/W Dr. Roda ShuttersXu.    HPI HPI: 60 y.o. male with h/o DM, diabetic neuropathy, seizures, HTN, acid reflux, hypercholesteremia, peripheral vascular disease due to secondary DM, CVA, carotid stenosis s/p CEA on L and tobacco abuse, who presented to ED with acute L side weakness since 3/14. MR Brain 3/16 acute posterior R MCA territory infarct. MRA Head 3/16 no emergent large vessel occlusion to correspond with acute posterior R MCA infarct.      SLP Plan  Continue with current plan of care;Other (Comment) (repeat MBS next date)     Recommendations  Diet recommendations: Dysphagia 1 (puree);Pudding-thick liquid Liquids provided via: Teaspoon Medication Administration:  Crushed with puree Supervision: Patient able to self feed;Staff to assist with self feeding Compensations: Slow rate;Small sips/bites Postural Changes and/or Swallow Maneuvers: Seated upright 90 degrees             Oral Care Recommendations: Oral care QID Follow up Recommendations: Skilled Nursing facility Plan: Continue with current plan of care;Other (Comment) (repeat MBS next date)     GO                Ian Garcia, [employer] Ian Garcia 06/05/2015, 1:26 PM

## 2015-06-05 NOTE — Telephone Encounter (Signed)
LM for pt re appt, dpm °

## 2015-06-05 NOTE — Telephone Encounter (Signed)
-----   Message from Sharee PimpleMarilyn K McChesney, RN sent at 06/01/2015  3:33 PM EDT ----- Regarding: schedule   ----- Message -----    From: Sherren Kernsharles E Fields, MD    Sent: 06/01/2015   8:41 AM      To: [facility] Charge Pool  Pt needs follow up [facility] in 2 weeks  Fabienne Brunsharles Fields

## 2015-06-06 ENCOUNTER — Inpatient Hospital Stay (HOSPITAL_COMMUNITY): Payer: Non-veteran care

## 2015-06-06 LAB — GLUCOSE, CAPILLARY
GLUCOSE-CAPILLARY: 155 mg/dL — AB (ref 65–99)
GLUCOSE-CAPILLARY: 159 mg/dL — AB (ref 65–99)
GLUCOSE-CAPILLARY: 189 mg/dL — AB (ref 65–99)
GLUCOSE-CAPILLARY: 317 mg/dL — AB (ref 65–99)
Glucose-Capillary: 291 mg/dL — ABNORMAL HIGH (ref 65–99)

## 2015-06-06 LAB — URINALYSIS, ROUTINE W REFLEX MICROSCOPIC
Bilirubin Urine: NEGATIVE
GLUCOSE, UA: 250 mg/dL — AB
Ketones, ur: NEGATIVE mg/dL
LEUKOCYTES UA: NEGATIVE
NITRITE: NEGATIVE
Specific Gravity, Urine: 1.029 (ref 1.005–1.030)
pH: 6 (ref 5.0–8.0)

## 2015-06-06 LAB — URINE MICROSCOPIC-ADD ON: Squamous Epithelial / LPF: NONE SEEN

## 2015-06-06 NOTE — Progress Notes (Signed)
STROKE TEAM PROGRESS NOTE   SUBJECTIVE (INTERVAL HISTORY) Patient in bed, leans to the right. NT has difficult tie keeping him upright with left hemiplegia and leg amputations. Patient with fever last night. Afebrile today. For MBSS today.   OBJECTIVE Temp:  [97.8 F (36.6 C)-102.6 F (39.2 C)] 99.1 F (37.3 C) (03/30 1020) Pulse Rate:  [68-82] 70 (03/30 1020) Cardiac Rhythm:  [-] Normal sinus rhythm (03/29 1900) Resp:  [15-20] 18 (03/30 1020) BP: (108-151)/(47-94) 108/94 mmHg (03/30 1020) SpO2:  [92 %-97 %] 97 % (03/30 1020) Weight:  [72.7 kg (160 lb 4.4 oz)] 72.7 kg (160 lb 4.4 oz) (03/30 0509)  CBC:   Recent Labs Lab 06/04/15 0500 06/05/15 0542  WBC 10.0 9.5  HGB 10.9* 11.1*  HCT 36.2* 36.5*  MCV 86.6 85.9  PLT 376 378   Basic Metabolic Panel:   Recent Labs Lab 06/03/15 0605 06/04/15 0500 06/05/15 0542  NA 138 141 141  K 3.6 3.8 4.1  CL 93* 101 103  CO2 34* 31 29  GLUCOSE 228* 164* 193*  BUN 27* 26* 19  CREATININE 2.19* 1.99* 1.71*  CALCIUM 8.5* 8.2* 8.1*  MG 1.8  --   --   PHOS 4.1  --   --    Lipid Panel:     Component Value Date/Time   CHOL 264* 05/24/2015 0357   TRIG 271* 05/24/2015 0357   HDL 30* 05/24/2015 0357   CHOLHDL 8.8 05/24/2015 0357   VLDL 54* 05/24/2015 0357   LDLCALC 180* 05/24/2015 0357   HgbA1c:  Lab Results  Component Value Date   HGBA1C 12.8* 05/24/2015    IMAGING I have personally reviewed the radiological images below and agree with the radiology interpretations.  CT Head Wo Contrast 05/22/2015   1. Subacute nonhemorrhagic infarction high in the right parietal lobe with secondary edema of that portion of the brain without midline shift.  2. Multiple small scalp contusions.   CT head without contrast 05/28/2015 Continued normal expected interval evolution of moderate sized right MCA infarct.  No hemorrhagic transformation or other complication identified.  No other new intracranial process.  Portable chest x-ray 1  view 06/05/2015 Mild bibasilar atelectasis. 06/03/2015 Persistent postoperative change with soft tissue air in the right supraclavicular region. Mild bibasilar atelectatic change. No edema or consolidation. Stable cardiac prominence. 05/31/2015 1. Left jugular catheter terminates in the region of the left brachiocephalic vein. 2. Hypoinflation with left basilar atelectasis versus consolidation.  [name] Head Wo Contrast 05/23/2015   Motion degraded. No emergent large vessel occlusion to correspond with the acute posterior right MCA infarct.   Mr Brain Wo Contrast 05/23/2015   1. Acute posterior right MCA territory infarct corresponding to the abnormal CT appearance yesterday. Cytotoxic edema without intracranial mass effect at this time.  2. Intracranial MRA from today reported separately.  3. Moderately to severely motion degraded and the examination had to be discontinued prior to completion due to patient agitation.  4. Consider surveillance for associated intracranial hemorrhage and mass effect with noncontrast CT. (see above)  CT angiogram of the neck with and without contrast 05/30/2015 1. New High-grade Radiographic-String-Sign stenosis of the Right CCA at the level of the larynx since 2011. Bulky 17 mm Thrombus distal to this stenosis adherent to the wall of the vessel at this time. 2. Surgery to the left carotid bifurcation since 2011 with restored patency of the left ICA origin. Progressed soft plaque in the left CCA resulting in stenosis of 50-60% at the level  of the thyroid cartilage. 3. 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 No distal reconstituted flow. 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 4. Right vertebral artery progressed soft plaque in the V2 segment at the C3 level with new severe stenosis. Increased soft plaque in the V4 segment with new moderate stenosis. 5. Layering pleural effusions, up to moderate on the left and small on the  right. 6. Poor dentition. Chronic right mastoid inflammation.  US Renal 05/23/2015   1. No hydronephrosis.  2. Echogenic normal size kidneys, indicating nonspecific renal parenchymal disease of uncertain chronicity.  3. Mild diffuse bladder wall thickening and trabeculation, suggesting chronic bladder voiding dysfunction such as due to chronic bladder outlet obstruction by an enlarged prostate gland. The prostate gland is not evaluated on this scan.   US Carotid Bilateral 05/23/2015   Right: Nearly occlusive centralized thrombus/plaque within the right common carotid artery which contributes to post stenotic and decreased antegrade flow of the right ICA. This plaque is favored to represent acute embolism/thrombus given the morphology. Formal cerebral angiogram is recommended. As a second best test, CTA. Left: The patient is status post left carotid endarterectomy in September 2011, with atherosclerotic changes of the common carotid artery. Established duplex criteria have not been validated for assessment after surgery, however, there is no evidence of compromised flow. High diastolic component of the flow on the left suggests increase contribution, potentially to the right hemisphere given the small infarction on recent CT.   EEG - This is an abnormal EEG demonstrating posterior slowing on the right compared to that of the left, consistent with the patient's history of a new right PCA infarct. There was no epileptiform activity recorded on this recording.  2D echo - Upper normal LV chamber size with mild LVH and LVEF approximately 35%. There is diffuse hypokinesis with akinesis of the basal inferolateral/inferior wall. Grade 1 diastolic dysfunction with indeterminate filling pressure. Upper normal left atrial chamber size. Trivial mitral regurgitation. No obvious PFO or ASD.   PHYSICAL EXAM Temp:  [97.8 F (36.6 C)-102.6 F (39.2 C)] 99.1 F (37.3 C) (03/30 1020) Pulse Rate:  [68-82] 70  (03/30 1020) Resp:  [15-20] 18 (03/30 1020) BP: (108-151)/(47-94) 108/94 mmHg (03/30 1020) SpO2:  [92 %-97 %] 97 % (03/30 1020) Weight:  [72.7 kg (160 lb 4.4 oz)] 72.7 kg (160 lb 4.4 oz) (03/30 0509)  General - cachetic looking, well developed, in no apparent distress. Cardiovascular - Regular rate and rhythm. Chest:  CTA Abd:  Soft NT, ND normal bowel sounds Extremities:  Left AKA and right BKA  Mental Status -  Awake alert, orientation to year (not month), place, and person were intact. Language including expression, naming, repetition, comprehension was assessed and found intact.  Cranial Nerves II - XII - II -  . Does not blink to threat bilaterally and poor effort on exam when testing EOM. But lateral gaze bilaterally  intact when I move to each side  III, IV, VI - lateral gaze intact  V - Facial sensation intact bilaterally. VII - Facial movement intact bilaterally. VIII - Hearing & vestibular intact bilaterally. X - Palate elevates symmetrically. XI - Chin turning & shoulder shrug intact bilaterally. XII - Tongue protrusion intact.  Motor Strength - The patient's strength was 0/5 LUE and left leg stump, 5/5 RUE and right leg stump, bilateral AKA.  Bulk was normal and fasciculations were absent.   Motor Tone - Muscle tone was assessed at the neck and appendages and  was normal.  Sensory - Light touch, temperature/pinprick were assessed and were symmetrical.    Coordination - The patient had normal movements in the right hand with no ataxia or dysmetria.    Gait and Station - not able to test.   ASSESSMENT/PLAN Mr. Nuala AlphaBruce L Bohannon is a 60 y.o. male with history of DM, seizures, htn, previous stroke, hld, and carotid stenosis presenting with acute onset of left-sided weakness. He did not receive IV t-PA due to late presentation.  Stroke:  Non-dominant right MCA territory infarct thomboembolic secondary to large vessel disease from near right carotid artery occlusion status  post resultant right CEA this admission  Resultant  Left hemiplegia, dysphagia  MRI  Acute posterior right MCA territory infarct with cytotoxic edema  MRA  No emergent large vessel occlusion to correspond with the acute posterior right MCA infarct.   Carotid Doppler - near occlusion of right carotid artery.   CTA of neck - New High-grade Radiographic-String-Sign stenosis of the Right CCA with thrombus.  2D Echo - EF 35%. Marland Kitchen.  LDL - 180  HgbA1c 12.6  VTE prophylaxis - subcutaneous heparin DIET DYS 2 Room service appropriate?: Yes; Fluid consistency:: Nectar Thick high risk for aspiration - ST following  clopidogrel 75 mg daily prior to admission, now on aspirin 325 mg daily and clopidogrel 75 mg daily.  Patient counseled to be compliant with his antithrombotic medications  Ongoing aggressive stroke risk factor management  D/c central line  Therapy recommendations:  SNF  Disposition: Pending  Once swallowing issue resolved, ok for discharge to SNF  Right ICA near occlusion  Cause of right MCA infarct  BP goal 130-150  S/P Right CEA 05/31/15 and right subclavian to carotid bypass   Postop course uneventful  Hypotension, resolved  Had been treated with IV fluids and dopamine  EF 35%   Cardiomyopathy and chronic Systolic CHF  EF 16%35%  Low BP  Cardiology following - appreciate assistance.  Continue coreg, increased to 6.25mg  bid  No ACEI or ARB due to renal function  Lasix 20mg  daily per cardiology  Dysphagia  Able to take po meds in puree  Pt refused NG tube, but OK with PEG if needed  ST - downgraded to Dys 1 pudding on 3/29  ST to reassess MBSS today   Repeat CXR ok  Febrile during the night  Likely will need PEG for liquids, at least short-term - await MBSS results  Dehydration   Elevated creatinine 1.71 -> 2.19->1.99->1.71, Bun 15->27->26->19.   Added IVF  Lasix 20mg  daily  Seizure disorder  Hx of seizure on keppra   Reported event  of left arm shaking treated with ativan  EEG showed right slowing, no seizure  Continue keppra 1000mg  bid  Hyperlipidemia  Home meds:  Lipitor 80 mg daily resumed in hospital  LDL 180, goal < 70  Consider adding Zetia once po access.  Continue statin at discharge  Diabetes  HgbA1c 12.6, goal < 7.0  Uncontrolled  Noncompliance  Hold Lantus for now giving NPO status.   Tobacco abuse  Current smoker  Smoking cessation counseling provided  Pt is NOT willing to quit  Other Stroke Risk Factors  Hx stroke/TIA  S/p left CEA  Other Active Problems  Urinary retention. Foley catheter placed.  Hyperkalemia 5.2 - repeat Monday -> 3.6->3.8  B/L AKA  Poor medical compliance  S/P Right CEA 05/31/15 with excision of a supraclavicular mass -> seborrheic keratosis.  Hospital day # [id number]   Shanara Schnieders,  MD PhD Stroke Neurology 06/06/2015 10:48 PM    To contact Stroke Continuity provider, please refer to [email or web address]. After hours, contact General Neurology

## 2015-06-06 NOTE — Clinical Social Work Note (Signed)
CSW received [facility] from [facility] stating [facility]. CSW faxed referral out to all 5 of [facility] contracted facilities in [place] on 3/21.   BSW Intern contacted facilities for pending referral status:  [facility], [place], [place] left a voicemail   [facility], [place], [place] [address] a [address]   [facility], [place], [place]- left a voicemail   [facility], [place], [place] beds available at this time   [facility], [place], [place]- left a voicemail   CSW awaiting returned phone calls from SNF's/will follow up with SNF's at later date. CSW remains available as needed.  Derenda FennelBashira Dontez Hauss, MSW, LCSWA [phone] 06/06/2015 4:05 PM

## 2015-06-06 NOTE — Progress Notes (Signed)
Speech Pathology:  MBSS complete. Full report located under chart review in imaging section. Diet to be advanced to dysphagia 2, nectars Must provided full supervision but allow pt to feed himself.  Ian Garcia, [place] CCC/SLP Pager [phone]

## 2015-06-06 NOTE — Clinical Social Work Note (Signed)
Patient has transferred from 3S13 to 5C10 and CSW for receiving unit contacted and updated. CSW's continuing to follow patient's progress and will assist with discharge to a [facility] contracted skilled nursing facility when medically stable.  Genelle BalVanessa Dovey Fatzinger, MSW, LCSW Licensed Clinical Social Worker Clinical Social Work Department [facility] [phone]

## 2015-06-06 NOTE — Progress Notes (Signed)
Patient verbalized he has a BM 2 days ago.    Sim BoastHavy, RN

## 2015-06-06 NOTE — Progress Notes (Signed)
Physical Therapy Treatment Patient Details Name: Ian AlphaBruce L Lagrange MRN: [id number] DOB: 07/24/1955 Today's Date: 06/06/2015    History of Present Illness Patient is a 60 yo male admitted 05/22/15 with Lt sided weakness and decreased sensation.  MRI showed acute Rt MCA infarct.  Patient also with thrombus in Rt ICA.    Pt is now s/p Rt carotid endarterectomy and Rt subclavian to carotid bypass.  PMH:  DM, neuropathy, PVD, bilateral AKA's, HTN, seizure disorder, CVA, carotid stenosis, tobacco use, CHF with EF 35%    PT Comments    Pt performed sitting to edge of bed with +2 total assist.  Pt remains to present with poor trunk control and increased R cervical flexion.  Pt may benefit from soft cervical collar to improve postioning of cervical spine.    Follow Up Recommendations  SNF;Supervision/Assistance - 24 hour     Equipment Recommendations  None recommended by PT    Recommendations for Other Services       Precautions / Restrictions Precautions Precautions: Fall Precaution Comments: B amputation Restrictions Weight Bearing Restrictions: No    Mobility  Bed Mobility Overal bed mobility: Needs Assistance;+2 for physical assistance Bed Mobility: Rolling;Supine to Sit Rolling: Mod assist;Total assist (Required mod assist to roll to L and total assist +2 to roll to R.  )   Supine to sit: Total assist;+2 for physical assistance Sit to supine: Total assist;+2 for physical assistance   General bed mobility comments: Pt required total assist for trunk elevation after three failed attempts to transition in to seated position.  Pt sat in long sitting in bed holding to B rails for support with R hand and L elbow.  PTA propped L elbow on L rail.  PTA facilitated anterior tilt of pelvis but pt regresses to posterior tilt when PTA attempts to correct postural and head control.  After sit to supine patient required bed in trendelenberg to position to head of bed.  Pt able to pull with R arm to  scoot in supine.  Pt performed rolling with facilitation of trunk and hip to turn with cues.  Head control remains poor required propping and assist during tx.    Transfers Overall transfer level:  (remains unsafe, Pt unable to tolerated sitting in controlled environment, trunk remains to weak.  )                  Ambulation/Gait                 Stairs            Wheelchair Mobility    Modified Rankin (Stroke Patients Only)       Balance     Sitting balance-Leahy Scale: Zero Sitting balance - Comments: Pt with posterior lean , R neck rotation and pushing with R UE when placed on surface.  PTA and tech facilitated posturing in seated position x 10 min before returning to supine position.                              Cognition Arousal/Alertness: Awake/alert Behavior During Therapy: Flat affect Overall Cognitive Status: Impaired/Different from baseline Area of Impairment: Orientation;Attention;Safety/judgement;Awareness;Problem solving Orientation Level: Disoriented to;Time;Situation Current Attention Level: Sustained     Safety/Judgement: Decreased awareness of safety;Decreased awareness of deficits Awareness: Intellectual Problem Solving: Slow processing;Decreased initiation;Difficulty sequencing;Requires verbal cues;Requires tactile cues General Comments: L inattention    Exercises      General Comments  Pertinent Vitals/Pain Pain Assessment: Faces Pain Score: 4  Faces Pain Scale: Hurts little more Pain Location: buttocks when positioned upright to eat.   Pain Descriptors / Indicators: Aching;Sore Pain Intervention(s): Repositioned    Home Living                      Prior Function            PT Goals (current goals can now be found in the care plan section) Acute Rehab PT Goals Patient Stated Goal: none stated Potential to Achieve Goals: Fair Progress towards PT goals: Not progressing toward goals - comment  (remains limited to progress due to weakness.  )    Frequency  Min 2X/week    PT Plan Frequency needs to be updated    Co-evaluation             End of Session   Activity Tolerance: Patient limited by lethargy;Patient limited by pain Patient left: in bed;with bed alarm set;with call bell/phone within reach     Time: 1314-1338 PT Time Calculation (min) (ACUTE ONLY): 24 min  Charges:  $Therapeutic Activity: 8-22 mins $Neuromuscular Re-education: 8-22 mins                    G Codes:      Florestine Avers 2015/06/08, 3:28 PM  Joycelyn Rua, PTA pager [phone]

## 2015-06-06 NOTE — Progress Notes (Signed)
Speech Language Pathology Treatment: Dysphagia  Patient Details Name: Ian Garcia MRN: [id number] DOB: 07-10-1955 Today's Date: 06/06/2015 Time: [id number] SLP Time Calculation (min) (ACUTE ONLY): 13 min  Assessment / Plan / Recommendation Clinical Impression  Assisted pt with lunch after PT positioned for maximal safety.  Pt able to self feed with RUE, but requires mod verbal/tactile cues to slow rate and decrease bolus size given his impulsivity, poor self-regulation.  When responsive to cues, he demonstrated adequate toleration, clear voice post-swallow and no overt s/s of aspiration with new diet -dysphagia 2, nectars.  Pt with poor insight and no recall of results/recs from today's test.  Continue SLP f/u here and after D/C to SNF.    HPI HPI: 60 y.o. male with h/o DM, diabetic neuropathy, seizures, HTN, acid reflux, hypercholesteremia, peripheral vascular disease due to secondary DM, CVA, carotid stenosis s/p CEA on L and tobacco abuse, who presented to ED with acute L side weakness since 3/14. MR Brain 3/16 acute posterior R MCA territory infarct. MRA Head 3/16 no emergent large vessel occlusion to correspond with acute posterior R MCA infarct.      SLP Plan  Continue with current plan of care     Recommendations  Diet recommendations: Dysphagia 2 (fine chop);Nectar-thick liquid Liquids provided via: Cup Medication Administration: Whole meds with puree Supervision: Patient able to self feed;Staff to assist with self feeding Compensations: Slow rate;Small sips/bites;Effortful swallow Postural Changes and/or Swallow Maneuvers: Seated upright 90 degrees             Follow up Recommendations: Skilled Nursing facility Plan: Continue with current plan of care     GO              Ian Garcia L. Ian Garcia, [place] CCC/SLP Pager [phone]   Ian Garcia, Ian Garcia 06/06/2015, 2:01 PM

## 2015-06-07 LAB — GLUCOSE, CAPILLARY
GLUCOSE-CAPILLARY: 155 mg/dL — AB (ref 65–99)
GLUCOSE-CAPILLARY: 223 mg/dL — AB (ref 65–99)
GLUCOSE-CAPILLARY: 276 mg/dL — AB (ref 65–99)
Glucose-Capillary: 224 mg/dL — ABNORMAL HIGH (ref 65–99)
Glucose-Capillary: 173 mg/dL — ABNORMAL HIGH (ref 65–99)
Glucose-Capillary: 248 mg/dL — ABNORMAL HIGH (ref 65–99)
Glucose-Capillary: 164 mg/dL — ABNORMAL HIGH (ref 65–99)
Glucose-Capillary: 279 mg/dL — ABNORMAL HIGH (ref 65–99)
Glucose-Capillary: 241 mg/dL — ABNORMAL HIGH (ref 65–99)
Glucose-Capillary: 163 mg/dL — ABNORMAL HIGH (ref 65–99)

## 2015-06-07 NOTE — Progress Notes (Signed)
Speech Language Pathology Treatment: Dysphagia  Patient Details Name: Ian Garcia MRN: [id number] DOB: 1955/07/02 Today's Date: 06/07/2015 Time: [id number] SLP Time Calculation (min) (ACUTE ONLY): 20 min  Assessment / Plan / Recommendation Clinical Impression  Pt's swallow function continues to fluctuate despite our best efforts to minimize aspiration through diet modifications and precautions.  Overt coughing noted with consumption of meds crushed in puree per RN, creating concerns for aspiration.  During reassessment with SLP, performance/toleration was improved, with no overt s/s of aspiration.  Pt lacks any insight into deficits and is unable to execute precautions without mod verbal/tactile cues.  Without putting a hand to the cup to stop him from drinking, he will consume an entire 4 oz of thick liquids without ceasing.    His toleration will likely continue to fluctuate.  Recommend continuing dysphagia 2 diet with nectar-thick liquids and full supervision, cues as needed.   SLP will follow.  D/W RN.    HPI HPI: 60 y.o. male with h/o DM, diabetic neuropathy, seizures, HTN, acid reflux, hypercholesteremia, peripheral vascular disease due to secondary DM, CVA, carotid stenosis s/p CEA on L and tobacco abuse, who presented to ED with acute L side weakness since 3/14. MR Brain 3/16 acute posterior R MCA territory infarct. MRA Head 3/16 no emergent large vessel occlusion to correspond with acute posterior R MCA infarct.      SLP Plan  Continue with current plan of care     Recommendations  Diet recommendations: Dysphagia 2 (fine chop);Nectar-thick liquid Liquids provided via: Cup Medication Administration: Whole meds with puree Supervision: Patient able to self feed;Staff to assist with self feeding Compensations: Slow rate;Small sips/bites;Effortful swallow Postural Changes and/or Swallow Maneuvers: Seated upright 90 degrees             Plan: Continue with current plan of care     Alleen Kehm L. Samson Fredericouture, [place] CCC/SLP Pager [phone]                [name], Camron Essman Laurice 06/07/2015, 11:36 AM

## 2015-06-07 NOTE — Clinical Social Work Note (Signed)
[facility] SNF facility update:  [place], [place] [facility]   [facility], [place], [place] left a [address]   [facility] [place], [place], [place]- left a voicemail   [facility], [place], [place] no beds available at this time   [facility], [place], [place] unable to extend bed offer   Ian FennelBashira Sonika Garcia, MSW, LCSWA [phone] 06/07/2015 11:43 AM

## 2015-06-07 NOTE — Clinical Social Work Note (Signed)
Clinical Social Worker has contacted CSW AD for further instruction on securing [facility] SNF bed. Handoff provided to assigned CSW.   Patient approved for 45 days at SNF [facility] with [facility].  [facility] Contact: Andi HenceJoseph Campbell [phone]  Currently patient is not medically stable for d/c. This CSW signing off.   Derenda FennelBashira Atleigh Gruen, MSW, LCSWA [phone] 06/07/2015 12:03 PM

## 2015-06-07 NOTE — Progress Notes (Signed)
Inpatient Diabetes Program Recommendations  AACE/ADA: New Consensus Statement on Inpatient Glycemic Control (2015)  Target Ranges:  Prepandial:   less than 140 mg/dL      Peak postprandial:   less than 180 mg/dL (1-2 hours)      Critically ill patients:  140 - 180 mg/dL   Results for Ian Garcia, Ian Garcia (MRN [id number]) as of 06/07/2015 09:23  Ref. Range 06/06/2015 08:28 06/06/2015 11:51 06/06/2015 17:00 06/06/2015 19:54 06/07/2015 00:14 06/07/2015 04:03 06/07/2015 08:07  Glucose-Capillary Latest Ref Range: 65-99 mg/dL 782155 (H) 956317 (H) 213291 (H) 276 (H) 224 (H) 173 (H) 155 (H)   Review of Glycemic Control  Diabetes history: DM2 Outpatient Diabetes medications: Lantus 30 units BID, Humulin R 10 units TID with meals, Glipizide 10 mg BID, Metformin 500 mg BID Current orders for Inpatient glycemic control: Novolog 0-15 units Q4H  Inpatient Diabetes Program Recommendations:  Insulin - Basal: Over the past 24 hours, patient has required a total of Novolog 41 units for correction.  Please consider ordering Lantus 15 units daily (based on 75 kg x 0.2 units).  Insulin-Correction: If patient is eating, please consider changing frequency of CBGs and Novolog to ACHS.  Thanks,  Christena DeemShannon Febe Champa RN, MSN, [facility] Inpatient Diabetes Coordinator Team Pager [phone] (8a-5p)

## 2015-06-07 NOTE — Progress Notes (Signed)
Physical Therapy Treatment Patient Details Name: Ian AlphaBruce L Szafran MRN: [id number] DOB: 1955-04-19 Today's Date: 06/07/2015    History of Present Illness Patient is a 60 yo male admitted 05/22/15 with Lt sided weakness and decreased sensation.  MRI showed acute Rt MCA infarct.  Patient also with thrombus in Rt ICA.    Pt is now s/p Rt carotid endarterectomy and Rt subclavian to carotid bypass.  PMH:  DM, neuropathy, PVD, bilateral AKA's, HTN, seizure disorder, CVA, carotid stenosis, tobacco use, CHF with EF 35%    PT Comments    Pt continues to remain total assist for bed mobility and requires extensive assist with trunk support in sitting.  Observed bil pushing/leaning if either hand placed on bed for support (strong pushing to left with right hand on bed and increased right lean with left hand on bed).  Continue to recommend SNF upon d/c.   Follow Up Recommendations  SNF;Supervision/Assistance - 24 hour     Equipment Recommendations  None recommended by PT    Recommendations for Other Services       Precautions / Restrictions Precautions Precautions: Fall Precaution Comments: B amputation    Mobility  Bed Mobility Overal bed mobility: Needs Assistance;+2 for physical assistance Bed Mobility: Supine to Sit;Sit to Supine     Supine to sit: Total assist;+2 for physical assistance Sit to supine: Total assist;+2 for physical assistance   General bed mobility comments: pt requiring total assist for positioning upper body and hips, utilized bed pad, pt remains seated in increased posterior tilt and trunk flexion with decreased ability to correct  Transfers                    Ambulation/Gait                 Stairs            Wheelchair Mobility    Modified Rankin (Stroke Patients Only)       Balance Overall balance assessment: Needs assistance Sitting-balance support: Single extremity supported;Feet unsupported Sitting balance-Leahy Scale:  Zero Sitting balance - Comments: pt unable to maintain upright posture without external assist despite assist for R UE holding rail, increased posterior lean if hands remain in lap, pt with pushing if propped with either hand on bed for support so performed propping on bil elbows with ability to tolerate and hold better on R side, also encouraged attention to objects on pt's left side                            Cognition Arousal/Alertness: Awake/alert Behavior During Therapy: Flat affect Overall Cognitive Status: Impaired/Different from baseline Area of Impairment: Orientation;Attention;Safety/judgement;Awareness;Problem solving Orientation Level: Disoriented to;Time;Situation       Safety/Judgement: Decreased awareness of safety;Decreased awareness of deficits   Problem Solving: Slow processing;Decreased initiation;Difficulty sequencing;Requires verbal cues;Requires tactile cues General Comments: L inattention, pt occasionally making statement out of context     Exercises Other Exercises Other Exercises: performed reaching with pt's R arm over to object on left side x10 in supine (pt required full trunk support at rest in sitting) Other Exercises: neck rotation to R side with lateral flexion as well    General Comments        Pertinent Vitals/Pain Pain Assessment: Faces Faces Pain Scale: Hurts even more Pain Location: R shoulder Pain Descriptors / Indicators: Stabbing Pain Intervention(s): Monitored during session;Repositioned    Home Living  Prior Function            PT Goals (current goals can now be found in the care plan section) Acute Rehab PT Goals PT Goal Formulation: With patient Time For Goal Achievement: 06/14/15 Potential to Achieve Goals: Fair Progress towards PT goals: Not progressing toward goals - comment (continues to remain limited due to weakness)    Frequency  Min 2X/week    PT Plan Current plan remains  appropriate    Co-evaluation PT/OT/SLP Co-Evaluation/Treatment: Yes Reason for Co-Treatment: For patient/therapist safety;Complexity of the patient's impairments (multi-system involvement) PT goals addressed during session: Mobility/safety with mobility;Balance OT goals addressed during session: ADL's and self-care     End of Session   Activity Tolerance: Patient limited by fatigue Patient left: in bed;with bed alarm set;with call bell/phone within reach     Time: 1441-1513 PT Time Calculation (min) (ACUTE ONLY): 32 min  Charges:  $Therapeutic Activity: 8-22 mins                    G Codes:      Simrah Chatham,KATHrine E 01-Jul-2015, 3:41 PM Zenovia Jarred, PT, DPT Jul 01, 2015 Pager: [phone]

## 2015-06-07 NOTE — Progress Notes (Signed)
STROKE TEAM PROGRESS NOTE   SUBJECTIVE (INTERVAL HISTORY) No family members present. The patient is anxious to progress his diet to the point that he can drink water. There has been some concern that the patient's low-grade fevers are the result of aspiration. Currently he is on nectar thick liquid. Pending SNF placement.    OBJECTIVE Temp:  [98.7 F (37.1 C)-99.8 F (37.7 C)] 98.9 F (37.2 C) (03/31 1336) Pulse Rate:  [64-75] 64 (03/31 1336) Cardiac Rhythm:  [-] Normal sinus rhythm (03/31 0700) Resp:  [16-20] 20 (03/31 1336) BP: (128-159)/(51-73) 134/63 mmHg (03/31 1336) SpO2:  [93 %-97 %] 95 % (03/31 1336) Weight:  [76.2 kg (167 lb 15.9 oz)] 76.2 kg (167 lb 15.9 oz) (03/31 0417)  CBC:   Recent Labs Lab 06/04/15 0500 06/05/15 0542  WBC 10.0 9.5  HGB 10.9* 11.1*  HCT 36.2* 36.5*  MCV 86.6 85.9  PLT 376 378   Basic Metabolic Panel:   Recent Labs Lab 06/03/15 0605 06/04/15 0500 06/05/15 0542  NA 138 141 141  K 3.6 3.8 4.1  CL 93* 101 103  CO2 34* 31 29  GLUCOSE 228* 164* 193*  BUN 27* 26* 19  CREATININE 2.19* 1.99* 1.71*  CALCIUM 8.5* 8.2* 8.1*  MG 1.8  --   --   PHOS 4.1  --   --    Lipid Panel:     Component Value Date/Time   CHOL 264* 05/24/2015 0357   TRIG 271* 05/24/2015 0357   HDL 30* 05/24/2015 0357   CHOLHDL 8.8 05/24/2015 0357   VLDL 54* 05/24/2015 0357   LDLCALC 180* 05/24/2015 0357   HgbA1c:  Lab Results  Component Value Date   HGBA1C 12.8* 05/24/2015    IMAGING I have personally reviewed the radiological images below and agree with the radiology interpretations.  CT Head Wo Contrast 05/22/2015   1. Subacute nonhemorrhagic infarction high in the right parietal lobe with secondary edema of that portion of the brain without midline shift.  2. Multiple small scalp contusions.   CT head without contrast 05/28/2015 Continued normal expected interval evolution of moderate sized right MCA infarct.  No hemorrhagic transformation or other  complication identified.  No other new intracranial process.  Portable chest x-ray 1 view 06/05/2015 Mild bibasilar atelectasis. 06/03/2015 Persistent postoperative change with soft tissue air in the right supraclavicular region. Mild bibasilar atelectatic change. No edema or consolidation. Stable cardiac prominence. 05/31/2015 1. Left jugular catheter terminates in the region of the left brachiocephalic vein. 2. Hypoinflation with left basilar atelectasis versus consolidation.  [name] Head Wo Contrast 05/23/2015   Motion degraded. No emergent large vessel occlusion to correspond with the acute posterior right MCA infarct.   Mr Brain Wo Contrast 05/23/2015   1. Acute posterior right MCA territory infarct corresponding to the abnormal CT appearance yesterday. Cytotoxic edema without intracranial mass effect at this time.  2. Intracranial MRA from today reported separately.  3. Moderately to severely motion degraded and the examination had to be discontinued prior to completion due to patient agitation.  4. Consider surveillance for associated intracranial hemorrhage and mass effect with noncontrast CT. (see above)  CT angiogram of the neck with and without contrast 05/30/2015 1. New High-grade Radiographic-String-Sign stenosis of the Right CCA at the level of the larynx since 2011. Bulky 17 mm Thrombus distal to this stenosis adherent to the wall of the vessel at this time. 2. Surgery to the left carotid bifurcation since 2011 with restored patency of the left ICA  origin. Progressed soft plaque in the left CCA resulting in stenosis of 50-60% at the level of the thyroid cartilage. 3. 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 No distal reconstituted flow. 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 4. Right vertebral artery progressed soft plaque in the V2 segment at the C3 level with new severe stenosis. Increased soft plaque in the V4 segment with new moderate  stenosis. 5. Layering pleural effusions, up to moderate on the left and small on the right. 6. Poor dentition. Chronic right mastoid inflammation.  [place] Renal 05/23/2015   1. No hydronephrosis.  2. Echogenic normal size kidneys, indicating nonspecific renal parenchymal disease of uncertain chronicity.  3. Mild diffuse bladder wall thickening and trabeculation, suggesting chronic bladder voiding dysfunction such as due to chronic bladder outlet obstruction by an enlarged prostate gland. The prostate gland is not evaluated on this scan.   [place] Carotid Bilateral 05/23/2015   Right: Nearly occlusive centralized thrombus/plaque within the right common carotid artery which contributes to post stenotic and decreased antegrade flow of the right ICA. This plaque is favored to represent acute embolism/thrombus given the morphology. Formal cerebral angiogram is recommended. As a second best test, CTA. Left: The patient is status post left carotid endarterectomy in September 2011, with atherosclerotic changes of the common carotid artery. Established duplex criteria have not been validated for assessment after surgery, however, there is no evidence of compromised flow. High diastolic component of the flow on the left suggests increase contribution, potentially to the right hemisphere given the small infarction on recent CT.   EEG - This is an abnormal EEG demonstrating posterior slowing on the right compared to that of the left, consistent with the patient's history of a new right PCA infarct. There was no epileptiform activity recorded on this recording.  2D echo - Upper normal LV chamber size with mild LVH and LVEF approximately 35%. There is diffuse hypokinesis with akinesis of the basal inferolateral/inferior wall. Grade 1 diastolic dysfunction with indeterminate filling pressure. Upper normal left atrial chamber size. Trivial mitral regurgitation. No obvious PFO or ASD.  Modified Barium Swallow with speech  therapy 06/06/2015 Moderate aspiration risk. Continue dysphagia 2 diet with nectar thick liquids    PHYSICAL EXAM Temp:  [98.7 F (37.1 C)-99.8 F (37.7 C)] 98.9 F (37.2 C) (03/31 1336) Pulse Rate:  [64-75] 64 (03/31 1336) Resp:  [16-20] 20 (03/31 1336) BP: (128-159)/(51-73) 134/63 mmHg (03/31 1336) SpO2:  [93 %-97 %] 95 % (03/31 1336) Weight:  [76.2 kg (167 lb 15.9 oz)] 76.2 kg (167 lb 15.9 oz) (03/31 0417)  General - cachetic looking, well developed, in no apparent distress. Cardiovascular - Regular rate and rhythm. Chest:  CTA Abd:  Soft NT, ND normal bowel sounds Extremities:  Left AKA and right BKA  Mental Status -  Awake alert, orientation to year (not month), place, and person were intact. Language including expression, naming, repetition, comprehension was assessed and found intact.  Cranial Nerves II - XII - II -  . Does not blink to threat bilaterally and poor effort on exam when testing EOM. But lateral gaze bilaterally  intact when I move to each side  III, IV, VI - lateral gaze intact  V - Facial sensation intact bilaterally. VII - Facial movement intact bilaterally. VIII - Hearing & vestibular intact bilaterally. X - Palate elevates symmetrically. XI - Chin turning & shoulder shrug intact bilaterally. XII - Tongue protrusion intact.  Motor Strength - The patient's strength was 0/5  LUE and left leg stump, 5/5 RUE and right leg stump, bilateral AKA.  Bulk was normal and fasciculations were absent.   Motor Tone - Muscle tone was assessed at the neck and appendages and was normal.  Sensory - Light touch, temperature/pinprick were assessed and were symmetrical.    Coordination - The patient had normal movements in the right hand with no ataxia or dysmetria.    Gait and Station - not able to test.   ASSESSMENT/PLAN Ian Garcia is a 60 y.o. male with history of DM, seizures, htn, previous stroke, hld, and carotid stenosis presenting with acute onset of  left-sided weakness. He did not receive IV t-PA due to late presentation.  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   Carotid Doppler - near occlusion of right carotid artery.   CTA of neck - New High-grade Radiographic-String-Sign stenosis of the Right CCA with thrombus.  2D Echo - EF 35%  LDL - 180  HgbA1c 12.6  VTE prophylaxis - subcutaneous heparin DIET DYS 2 Room service appropriate?: Yes; Fluid consistency:: Nectar Thick high risk for aspiration - ST following  clopidogrel 75 mg daily prior to admission, now on aspirin 325 mg daily and clopidogrel 75 mg daily.  Patient counseled to be compliant with his antithrombotic medications  Ongoing aggressive stroke risk factor management  D/c central line  Therapy recommendations:  [facility]  Disposition: Pending  Right ICA near occlusion  Cause of right MCA infarct  BP goal 130-150  S/P Right CEA 05/31/15 and right subclavian to carotid bypass   Postop course uneventful  Hypotension, resolved  Had been treated with IV fluids and dopamine  EF 35%   Cardiomyopathy and chronic Systolic CHF  EF 69%  Low BP  Cardiology following - appreciate assistance.  Continue coreg, increased to 6.25mg  bid  No ACEI or ARB due to renal function  Lasix  daily per cardiology  Dysphagia  Dysphagia 2 diet with nectar thick liquids  Able to take po meds   Pt refused NG tube, but OK with PEG if needed  ST - downgraded to Dys 1 pudding on 3/29  ST to reassess MBSS - upgrade to nectar thick again  Dehydration   Elevated  creatinine 1.71 -> 2.19->1.99->1.71, Bun 15->27->26->19.   Added IVF  Lasix  daily  Seizure disorder  Hx of seizure on keppra   Reported event of left arm shaking treated with ativan  EEG showed right slowing, no seizure  Continue keppra  bid  Hyperlipidemia  Home meds:  Lipitor 80 mg daily resumed in hospital  LDL 180, goal < 70  Consider adding Zetia once po access.  Continue statin at discharge  Diabetes  HgbA1c 12.6, goal < 7.0  Uncontrolled  Noncompliance  Hold Lantus for now giving NPO status.   Tobacco abuse  Current smoker  Smoking cessation counseling provided  Pt is NOT willing to quit  Other Stroke Risk Factors  Hx stroke/TIA  S/p left CEA  Other Active Problems  Urinary retention. Foley catheter placed.  Hyperkalemia 5.2 - repeat Monday -> 3.6->3.8  B/L AKA  Poor medical compliance  S/P Right CEA 05/31/15 with excision of a supraclavicular mass -> seborrheic keratosis.  Low-grade fevers - possibly secondary to aspiration - CXR 06/05/2015 (as above)  Hospital day # 16   Marvel Plan, MD PhD Stroke Neurology 06/07/2015 9:24 PM    To contact Stroke Continuity provider, please refer to [email or web address]. After hours, contact General Neurology

## 2015-06-07 NOTE — Progress Notes (Signed)
Occupational Therapy Treatment Patient Details Name: Ian Garcia MRN: [id number] DOB: 09/13/1955 Today's Date: 06/07/2015    History of present illness Patient is a 60 yo male admitted 05/22/15 with Lt sided weakness and decreased sensation.  MRI showed acute Rt MCA infarct.  Patient also with thrombus in Rt ICA.    Pt is now s/p Rt carotid endarterectomy and Rt subclavian to carotid bypass.  PMH:  DM, neuropathy, PVD, bilateral AKA's, HTN, seizure disorder, CVA, carotid stenosis, tobacco use, CHF with EF 35%   OT comments  Pt continues to require total assist +2 for bed mobility and mod assist for grooming activities in supported sitting. Pt tolerated weight bearing through L then R forearm (limited on R side secondary to shoulder pain). L visual scanning activities performed with max verbal cues. Pt continues to have strong bilateral lean and posterior lean in sitting requiring total assist for support. D/c plan remains appropriate. Will continue to follow acutely.   Follow Up Recommendations  SNF    Equipment Recommendations  None recommended by OT    Recommendations for Other Services      Precautions / Restrictions Precautions Precautions: Fall Precaution Comments: B amputation Restrictions Weight Bearing Restrictions: No       Mobility Bed Mobility Overal bed mobility: Needs Assistance;+2 for physical assistance Bed Mobility: Supine to Sit;Sit to Supine     Supine to sit: Total assist;+2 for physical assistance Sit to supine: Total assist;+2 for physical assistance   General bed mobility comments: pt requiring total assist for positioning upper body and hips, utilized bed pad, pt remains seated in increased posterior tilt and trunk flexion with decreased ability to correct  Transfers                      Balance Overall balance assessment: Needs assistance Sitting-balance support: Single extremity supported;Feet unsupported Sitting balance-Leahy Scale:  Zero Sitting balance - Comments: pt unable to maintain upright posture without external assist despite assist for R UE holding rail, increased posterior lean if hands remain in lap, pt with pushing if propped with either hand on bed for support so performed propping on bil elbows with ability to tolerate and hold better on R side, also encouraged attention to objects on pt's left side                           ADL Overall ADL's : Needs assistance/impaired     Grooming: Maximal assistance;Sitting;Wash/dry face (supported sitting)                                 General ADL Comments: Sat EOB with total assist +1/+2 for ~15 minutes; pt pushing toward L, R, and posteriorly. Practiced visual scanning toward L side during functional activities. Practiced weight bearing on L and R forearm; pt reported shoulder pain with R side weight bearing so WB limited on R side.        Vision                 Additional Comments: L visual field deficit. Pt able to turn head with verbal cues toward L side.   Perception     Praxis      Cognition   Behavior During Therapy: Flat affect Overall Cognitive Status: Impaired/Different from baseline Area of Impairment: Orientation;Attention;Safety/judgement;Awareness;Problem solving Orientation Level: Disoriented to;Time;Situation  Safety/Judgement: Decreased awareness of safety;Decreased awareness of deficits   Problem Solving: Slow processing;Decreased initiation;Difficulty sequencing;Requires verbal cues;Requires tactile cues General Comments: L inattention, pt occasionally making statement out of context     Extremity/Trunk Assessment               Exercises Other Exercises Other Exercises: performed reaching with pt's R arm over to object on left side x10 in supine (pt required full trunk support at rest in sitting) Other Exercises: neck rotation to R side with lateral flexion as well   Shoulder  Instructions       General Comments      Pertinent Vitals/ Pain       Pain Assessment: Faces Faces Pain Scale: Hurts even more Pain Location: R shoulder Pain Descriptors / Indicators: Stabbing Pain Intervention(s): Monitored during session;Repositioned  Home Living                                          Prior Functioning/Environment              Frequency Min 2X/week     Progress Toward Goals  OT Goals(current goals can now be found in the care plan section)  Progress towards OT goals: Progressing toward goals  Acute Rehab OT Goals Patient Stated Goal: none stated  Plan Discharge plan remains appropriate    Co-evaluation    PT/OT/SLP Co-Evaluation/Treatment: Yes Reason for Co-Treatment: For patient/therapist safety;Complexity of the patient's impairments (multi-system involvement) PT goals addressed during session: Mobility/safety with mobility;Balance OT goals addressed during session: ADL's and self-care      End of Session     Activity Tolerance Patient tolerated treatment well   Patient Left in bed;with call bell/phone within reach;with bed alarm set   Nurse Communication          Time: [id number] OT Time Calculation (min): 32 min  Charges: OT General Charges $OT Visit: 1 Procedure OT Treatments $Self Care/Home Management : 8-22 mins  Gaye AlkenBailey A Rudine Rieger M.S., OTR/L Pager: [phone]  06/07/2015, 5:21 PM

## 2015-06-07 NOTE — Progress Notes (Signed)
Pt with low grade temp. Respirations shallow and breath sounds diminished. Pt given incentive spirometer and educated on how to use. Pt states equipment broken, but per assessment, pt just breathing very shallow. Coughing with small amounts of applesauce. Notified speech therapist of changes. Will reeval. Lawson RadarHeather M Laci Frenkel

## 2015-06-07 NOTE — Care Management Note (Signed)
Case Management Note  Patient Details  Name: Nuala AlphaBruce L Kleist MRN: [id number] Date of Birth: [birth year]  Subjective/Objective:                    Action/Plan: Pt with some elevated temps. [facility] CM continuing to follow for discharge needs.   Expected Discharge Date:                  Expected Discharge Plan:  Skilled Nursing Facility  In-House Referral:  Clinical Social Work  Discharge planning Services  CM Consult  Post Acute Care Choice:    Choice offered to:     DME Arranged:    DME Agency:     HH Arranged:    HH Agency:     Status of Service:  Completed, signed off  Medicare Important Message Given:    Date Medicare IM Given:    Medicare IM give by:    Date Additional Medicare IM Given:    Additional Medicare Important Message give by:     If discussed at Long Length of Stay Meetings, dates discussed:    Additional Comments:  Kermit BaloKelli F Sebastyan Snodgrass, RN 06/07/2015, 10:40 AM

## 2015-06-08 LAB — GLUCOSE, CAPILLARY
GLUCOSE-CAPILLARY: 172 mg/dL — AB (ref 65–99)
GLUCOSE-CAPILLARY: 238 mg/dL — AB (ref 65–99)
Glucose-Capillary: 172 mg/dL — ABNORMAL HIGH (ref 65–99)
Glucose-Capillary: 236 mg/dL — ABNORMAL HIGH (ref 65–99)
Glucose-Capillary: 385 mg/dL — ABNORMAL HIGH (ref 65–99)

## 2015-06-08 NOTE — Progress Notes (Signed)
STROKE TEAM PROGRESS NOTE   SUBJECTIVE (INTERVAL HISTORY) No family members present. Awaiting a bed at the [facility].  Spoke with Child psychotherapist today to reconfirm that the patient is medically stable for discharge once bed is available.   OBJECTIVE Temp:  [98 F (36.7 C)-99.3 F (37.4 C)] 98 F (36.7 C) (04/01 0923) Pulse Rate:  [62-82] 75 (04/01 0923) Cardiac Rhythm:  [-] Normal sinus rhythm (04/01 0700) Resp:  [18-20] 18 (04/01 0923) BP: (100-145)/(61-79) 144/79 mmHg (04/01 0923) SpO2:  [92 %-97 %] 97 % (04/01 0923) Weight:  [76 kg (167 lb 8.8 oz)] 76 kg (167 lb 8.8 oz) (04/01 0641)  CBC:   Recent Labs Lab 06/04/15 0500 06/05/15 0542  WBC 10.0 9.5  HGB 10.9* 11.1*  HCT 36.2* 36.5*  MCV 86.6 85.9  PLT 376 378   Basic Metabolic Panel:   Recent Labs Lab 06/03/15 0605 06/04/15 0500 06/05/15 0542  NA 138 141 141  K 3.6 3.8 4.1  CL 93* 101 103  CO2 34* 31 29  GLUCOSE 228* 164* 193*  BUN 27* 26* 19  CREATININE 2.19* 1.99* 1.71*  CALCIUM 8.5* 8.2* 8.1*  MG 1.8  --   --   PHOS 4.1  --   --    Lipid Panel:     Component Value Date/Time   CHOL 264* 05/24/2015 0357   TRIG 271* 05/24/2015 0357   HDL 30* 05/24/2015 0357   CHOLHDL 8.8 05/24/2015 0357   VLDL 54* 05/24/2015 0357   LDLCALC 180* 05/24/2015 0357   HgbA1c:  Lab Results  Component Value Date   HGBA1C 12.8* 05/24/2015    IMAGING I have personally reviewed the radiological images below and agree with the radiology interpretations.  CT Head Wo Contrast 05/22/2015   1. Subacute nonhemorrhagic infarction high in the right parietal lobe with secondary edema of that portion of the brain without midline shift.  2. Multiple small scalp contusions.   CT head without contrast 05/28/2015 Continued normal expected interval evolution of moderate sized right MCA infarct.  No hemorrhagic transformation or other complication identified.  No other new intracranial process.  Portable chest x-ray 1  view 06/05/2015 Mild bibasilar atelectasis. 06/03/2015 Persistent postoperative change with soft tissue air in the right supraclavicular region. Mild bibasilar atelectatic change. No edema or consolidation. Stable cardiac prominence. 05/31/2015 1. Left jugular catheter terminates in the region of the left brachiocephalic vein. 2. Hypoinflation with left basilar atelectasis versus consolidation.  [name] Head Wo Contrast 05/23/2015   Motion degraded. No emergent large vessel occlusion to correspond with the acute posterior right MCA infarct.   Mr Brain Wo Contrast 05/23/2015   1. Acute posterior right MCA territory infarct corresponding to the abnormal CT appearance yesterday. Cytotoxic edema without intracranial mass effect at this time.  2. Intracranial MRA from today reported separately.  3. Moderately to severely motion degraded and the examination had to be discontinued prior to completion due to patient agitation.  4. Consider surveillance for associated intracranial hemorrhage and mass effect with noncontrast CT. (see above)  CT angiogram of the neck with and without contrast 05/30/2015 1. New High-grade Radiographic-String-Sign stenosis of the Right CCA at the level of the larynx since 2011. Bulky 17 mm Thrombus distal to this stenosis adherent to the wall of the vessel at this time. 2. Surgery to the left carotid bifurcation since 2011 with restored patency of the left ICA origin. Progressed soft plaque in the left CCA resulting in stenosis of 50-60% at the  level of the thyroid cartilage. 3. 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 No distal reconstituted flow. 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 4. Right vertebral artery progressed soft plaque in the V2 segment at the C3 level with new severe stenosis. Increased soft plaque in the V4 segment with new moderate stenosis. 5. Layering pleural effusions, up to moderate on the left and small on the  right. 6. Poor dentition. Chronic right mastoid inflammation.  US Renal 05/23/2015   1. No hydronephrosis.  2. Echogenic normal size kidneys, indicating nonspecific renal parenchymal disease of uncertain chronicity.  3. Mild diffuse bladder wall thickening and trabeculation, suggesting chronic bladder voiding dysfunction such as due to chronic bladder outlet obstruction by an enlarged prostate gland. The prostate gland is not evaluated on this scan.   US Carotid Bilateral 05/23/2015   Right: Nearly occlusive centralized thrombus/plaque within the right common carotid artery which contributes to post stenotic and decreased antegrade flow of the right ICA. This plaque is favored to represent acute embolism/thrombus given the morphology. Formal cerebral angiogram is recommended. As a second best test, CTA. Left: The patient is status post left carotid endarterectomy in September 2011, with atherosclerotic changes of the common carotid artery. Established duplex criteria have not been validated for assessment after surgery, however, there is no evidence of compromised flow. High diastolic component of the flow on the left suggests increase contribution, potentially to the right hemisphere given the small infarction on recent CT.   EEG - This is an abnormal EEG demonstrating posterior slowing on the right compared to that of the left, consistent with the patient's history of a new right PCA infarct. There was no epileptiform activity recorded on this recording.  2D echo - Upper normal LV chamber size with mild LVH and LVEF approximately 35%. There is diffuse hypokinesis with akinesis of the basal inferolateral/inferior wall. Grade 1 diastolic dysfunction with indeterminate filling pressure. Upper normal left atrial chamber size. Trivial mitral regurgitation. No obvious PFO or ASD.  Modified Barium Swallow with speech therapy 06/06/2015 Moderate aspiration risk. Continue dysphagia 2 diet with nectar  thick liquids    PHYSICAL EXAM Temp:  [98 F (36.7 C)-99.3 F (37.4 C)] 98 F (36.7 C) (04/01 0923) Pulse Rate:  [62-82] 75 (04/01 0923) Resp:  [18-20] 18 (04/01 0923) BP: (100-145)/(61-79) 144/79 mmHg (04/01 0923) SpO2:  [92 %-97 %] 97 % (04/01 0923) Weight:  [76 kg (167 lb 8.8 oz)] 76 kg (167 lb 8.8 oz) (04/01 0641)  General - cachetic looking, well developed, in no apparent distress. Cardiovascular - Regular rate and rhythm. Chest:  CTA Abd:  Soft NT, ND normal bowel sounds Extremities:  Left AKA and right BKA  Mental Status -  Awake alert, orientation to year (not month), place, and person were intact. Language including expression, naming, repetition, comprehension was assessed and found intact.  Cranial Nerves II - XII - II -  . Does not blink to threat bilaterally and poor effort on exam when testing EOM. But lateral gaze bilaterally  intact when I move to each side  III, IV, VI - lateral gaze intact  V - Facial sensation intact bilaterally. VII - Facial movement intact bilaterally. VIII - Hearing & vestibular intact bilaterally. X - Palate elevates symmetrically. XI - Chin turning & shoulder shrug intact bilaterally. XII - Tongue protrusion intact.  Motor Strength - The patient's strength was 0/5 LUE and left leg stump, 5/5 RUE and right leg stump, bilateral AKA.  Bulk  was normal and fasciculations were absent.   Motor Tone - Muscle tone was assessed at the neck and appendages and was normal.  Sensory - Light touch, temperature/pinprick were assessed and were symmetrical.    Coordination - The patient had normal movements in the right hand with no ataxia or dysmetria.    Gait and Station - not able to test.   ASSESSMENT/PLAN Mr. WITTEN CERTAIN is a 60 y.o. male with history of DM, seizures, htn, previous stroke, hld, and carotid stenosis presenting with acute onset of left-sided weakness. He did not receive IV t-PA due to late presentation.  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   Carotid Doppler - near occlusion of right carotid artery.   CTA of neck - New High-grade Radiographic-String-Sign stenosis of the Right CCA with thrombus.  2D Echo - EF 35%  LDL - 180  HgbA1c 12.6  VTE prophylaxis - subcutaneous heparin DIET DYS 2 Room service appropriate?: Yes; Fluid consistency:: Nectar Thick high risk for aspiration - ST following  clopidogrel 75 mg daily prior to admission, now on aspirin 325 mg daily and clopidogrel 75 mg daily.  Patient counseled to be compliant with his antithrombotic medications  Ongoing aggressive stroke risk factor management  D/c central line  Therapy recommendations:  [facility]  Disposition: Pending  Right ICA near occlusion  Cause of right MCA infarct  BP goal 130-150  S/P Right CEA 05/31/15 and right subclavian to carotid bypass   Postop course uneventful  Hypotension, resolved  Had been treated with IV fluids and dopamine  EF 35%   Cardiomyopathy and chronic Systolic CHF  EF 16%  Low BP  Cardiology following - appreciate assistance.  Continue coreg, increased to 6.25mg  bid  No ACEI or ARB due to renal function  Lasix  daily per cardiology  Dysphagia  Dysphagia 2 diet with nectar thick liquids  Able to take po meds   Pt refused NG tube, but OK with PEG if needed  ST - downgraded to Dys 1 pudding on 3/29  ST to reassess MBSS - upgrade to nectar thick again  Dehydration   Elevated creatinine 1.71 -> 2.19->1.99->1.71, Bun 15->27->26->19.   Added IVF  Lasix   daily  Seizure disorder  Hx of seizure on keppra   Reported event of left arm shaking treated with ativan  EEG showed right slowing, no seizure  Continue keppra  bid  Hyperlipidemia  Home meds:  Lipitor 80 mg daily resumed in hospital  LDL 180, goal < 70  Consider adding Zetia once po access.  Continue statin at discharge  Diabetes  HgbA1c 12.6, goal < 7.0  Uncontrolled  Noncompliance  Hold Lantus for now giving NPO status.   Tobacco abuse  Current smoker  Smoking cessation counseling provided  Pt is NOT willing to quit  Other Stroke Risk Factors  Hx stroke/TIA  S/p left CEA  Other Active Problems  Urinary retention. Foley catheter placed.  Hyperkalemia 5.2 - repeat Monday -> 3.6->3.8  B/L AKA  Poor medical compliance  S/P Right CEA 05/31/15 with excision of a supraclavicular mass -> seborrheic keratosis.  Occasional low-grade fevers ~ 99 - possibly secondary to aspiration - CXR 06/05/2015 - mild bibasilar atelectasis.   [facility] Spoke with social worker Saturday, 06/08/2015. They are hoping a bed will be available early next week. Reiterated that the patient is medically stable for discharge.   Hospital day # [address]   David Rinehuls PA-C [facility] Pager [phone] 06/08/2015, 12:35 PM I have personally examined this patient, reviewed notes, independently viewed imaging studies, participated in medical decision making and plan of care. I have made any additions or clarifications directly to the above note. Agree with note above. [facility]  Delia HeadyPramod Terrion Poblano, MD Medical Director [facility] Pager: [phone] 06/08/2015 12:48 PM    To contact Stroke Continuity provider, please refer to [email or web address]. After hours, contact General Neurology

## 2015-06-09 LAB — GLUCOSE, CAPILLARY
GLUCOSE-CAPILLARY: 219 mg/dL — AB (ref 65–99)
GLUCOSE-CAPILLARY: 173 mg/dL — AB (ref 65–99)
GLUCOSE-CAPILLARY: 221 mg/dL — AB (ref 65–99)
Glucose-Capillary: 180 mg/dL — ABNORMAL HIGH (ref 65–99)
Glucose-Capillary: 157 mg/dL — ABNORMAL HIGH (ref 65–99)
Glucose-Capillary: 197 mg/dL — ABNORMAL HIGH (ref 65–99)

## 2015-06-09 MED ORDER — NICOTINE 7 MG/24HR TD PT24
7.0000 mg | MEDICATED_PATCH | Freq: Every day | TRANSDERMAL | Status: DC
  Administered 2015-06-09 – 2015-06-13 (×5): 7 mg via TRANSDERMAL
  Filled 2015-06-09 (×8): qty 1

## 2015-06-09 NOTE — Progress Notes (Signed)
STROKE TEAM PROGRESS NOTE   SUBJECTIVE (INTERVAL HISTORY) No family members present. Awaiting a bed at the [facility] SNF.  Spoke with Child psychotherapist today to reconfirm that the patient is medically stable for discharge once bed is available. He feels stable today, says he is waiting for a ride today.   OBJECTIVE Temp:  [98 F (36.7 C)-99.4 F (37.4 C)] 98.4 F (36.9 C) (04/02 0750) Pulse Rate:  [68-78] 72 (04/02 0750) Cardiac Rhythm:  [-] Normal sinus rhythm (04/01 1902) Resp:  [18] 18 (04/02 0750) BP: (135-153)/(66-98) 145/98 mmHg (04/02 0750) SpO2:  [90 %-99 %] 92 % (04/02 0750) Weight:  [74.617 kg (164 lb 8 oz)] 74.617 kg (164 lb 8 oz) (04/02 0543)  CBC:   Recent Labs Lab 06/04/15 0500 06/05/15 0542  WBC 10.0 9.5  HGB 10.9* 11.1*  HCT 36.2* 36.5*  MCV 86.6 85.9  PLT 376 378   Basic Metabolic Panel:   Recent Labs Lab 06/03/15 0605 06/04/15 0500 06/05/15 0542  NA 138 141 141  K 3.6 3.8 4.1  CL 93* 101 103  CO2 34* 31 29  GLUCOSE 228* 164* 193*  BUN 27* 26* 19  CREATININE 2.19* 1.99* 1.71*  CALCIUM 8.5* 8.2* 8.1*  MG 1.8  --   --   PHOS 4.1  --   --    Lipid Panel:     Component Value Date/Time   CHOL 264* 05/24/2015 0357   TRIG 271* 05/24/2015 0357   HDL 30* 05/24/2015 0357   CHOLHDL 8.8 05/24/2015 0357   VLDL 54* 05/24/2015 0357   LDLCALC 180* 05/24/2015 0357   HgbA1c:  Lab Results  Component Value Date   HGBA1C 12.8* 05/24/2015    IMAGING I have personally reviewed the radiological images below and agree with the radiology interpretations.  CT Head Wo Contrast 05/22/2015   1. Subacute nonhemorrhagic infarction high in the right parietal lobe with secondary edema of that portion of the brain without midline shift.  2. Multiple small scalp contusions.   CT head without contrast 05/28/2015 Continued normal expected interval evolution of moderate sized right MCA infarct.  No hemorrhagic transformation or other complication identified.  No other new  intracranial process.  Portable chest x-ray 1 view 06/05/2015 Mild bibasilar atelectasis. 06/03/2015 Persistent postoperative change with soft tissue air in the right supraclavicular region. Mild bibasilar atelectatic change. No edema or consolidation. Stable cardiac prominence. 05/31/2015 1. Left jugular catheter terminates in the region of the left brachiocephalic vein. 2. Hypoinflation with left basilar atelectasis versus consolidation.  [name] Head Wo Contrast 05/23/2015   Motion degraded. No emergent large vessel occlusion to correspond with the acute posterior right MCA infarct.   Mr Brain Wo Contrast 05/23/2015   1. Acute posterior right MCA territory infarct corresponding to the abnormal CT appearance yesterday. Cytotoxic edema without intracranial mass effect at this time.  2. Intracranial MRA from today reported separately.  3. Moderately to severely motion degraded and the examination had to be discontinued prior to completion due to patient agitation.  4. Consider surveillance for associated intracranial hemorrhage and mass effect with noncontrast CT. (see above)  CT angiogram of the neck with and without contrast 05/30/2015 1. New High-grade Radiographic-String-Sign stenosis of the Right CCA at the level of the larynx since 2011. Bulky 17 mm Thrombus distal to this stenosis adherent to the wall of the vessel at this time. 2. Surgery to the left carotid bifurcation since 2011 with restored patency of the left ICA origin. Progressed soft  plaque in the left CCA resulting in stenosis of 50-60% at the level of the thyroid cartilage. 3. 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 No distal reconstituted flow. 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 4. Right vertebral artery progressed soft plaque in the V2 segment at the C3 level with new severe stenosis. Increased soft plaque in the V4 segment with new moderate stenosis. 5. Layering pleural effusions,  up to moderate on the left and small on the right. 6. Poor dentition. Chronic right mastoid inflammation.  US Renal 05/23/2015   1. No hydronephrosis.  2. Echogenic normal size kidneys, indicating nonspecific renal parenchymal disease of uncertain chronicity.  3. Mild diffuse bladder wall thickening and trabeculation, suggesting chronic bladder voiding dysfunction such as due to chronic bladder outlet obstruction by an enlarged prostate gland. The prostate gland is not evaluated on this scan.   US Carotid Bilateral 05/23/2015   Right: Nearly occlusive centralized thrombus/plaque within the right common carotid artery which contributes to post stenotic and decreased antegrade flow of the right ICA. This plaque is favored to represent acute embolism/thrombus given the morphology. Formal cerebral angiogram is recommended. As a second best test, CTA. Left: The patient is status post left carotid endarterectomy in September 2011, with atherosclerotic changes of the common carotid artery. Established duplex criteria have not been validated for assessment after surgery, however, there is no evidence of compromised flow. High diastolic component of the flow on the left suggests increase contribution, potentially to the right hemisphere given the small infarction on recent CT.   EEG - This is an abnormal EEG demonstrating posterior slowing on the right compared to that of the left, consistent with the patient's history of a new right PCA infarct. There was no epileptiform activity recorded on this recording.  2D echo - Upper normal LV chamber size with mild LVH and LVEF approximately 35%. There is diffuse hypokinesis with akinesis of the basal inferolateral/inferior wall. Grade 1 diastolic dysfunction with indeterminate filling pressure. Upper normal left atrial chamber size. Trivial mitral regurgitation. No obvious PFO or ASD.  Modified Barium Swallow with speech therapy 06/06/2015 Moderate aspiration  risk. Continue dysphagia 2 diet with nectar thick liquids    PHYSICAL EXAM Temp:  [98 F (36.7 C)-99.4 F (37.4 C)] 98.4 F (36.9 C) (04/02 0750) Pulse Rate:  [68-78] 72 (04/02 0750) Resp:  [18] 18 (04/02 0750) BP: (135-153)/(66-98) 145/98 mmHg (04/02 0750) SpO2:  [90 %-99 %] 92 % (04/02 0750) Weight:  [74.617 kg (164 lb 8 oz)] 74.617 kg (164 lb 8 oz) (04/02 0543)  General - cachetic looking, well developed, in no apparent distress. Cardiovascular - Regular rate and rhythm. Chest:  CTA Abd:  Soft NT, ND normal bowel sounds Extremities:  Left AKA and right BKA  Mental Status -  Awake alert, orientation to year (not month), place, and person were intact. Language including expression, naming, repetition, comprehension was assessed and found intact.  Cranial Nerves II - XII - II -  . Does not blink to threat bilaterally and poor effort on exam when testing EOM. But lateral gaze bilaterally  intact when I move to each side  III, IV, VI - lateral gaze intact  V - Facial sensation intact bilaterally. VII - Facial movement intact bilaterally. VIII - Hearing & vestibular intact bilaterally. X - Palate elevates symmetrically. XI - Chin turning & shoulder shrug intact bilaterally. XII - Tongue protrusion intact.  Motor Strength - The patient's strength was 0/5 LUE and left  leg stump, 5/5 RUE and right leg stump, bilateral AKA.  Bulk was normal and fasciculations were absent.   Motor Tone - Muscle tone was assessed at the neck and appendages and was normal.  Sensory - Light touch, temperature/pinprick were assessed and were symmetrical.    Coordination - The patient had normal movements in the right hand with no ataxia or dysmetria.    Gait and Station - not able to test.   ASSESSMENT/PLAN Mr. Ian Garcia is a 60 y.o. male with history of DM, seizures, htn, previous stroke, hld, and carotid stenosis presenting with acute onset of left-sided weakness. He did not receive IV t-PA  due to late presentation.  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   Carotid Doppler - near occlusion of right carotid artery.   CTA of neck - New High-grade Radiographic-String-Sign stenosis of the Right CCA with thrombus.  2D Echo - EF 35%  LDL - 180  HgbA1c 12.6  VTE prophylaxis - subcutaneous heparin DIET DYS 2 Room service appropriate?: Yes; Fluid consistency:: Nectar Thick high risk for aspiration - ST following  clopidogrel 75 mg daily prior to admission, now on aspirin 325 mg daily and clopidogrel 75 mg daily.  Patient counseled to be compliant with his antithrombotic medications  Ongoing aggressive stroke risk factor management  D/c central line  Therapy recommendations:  [facility]  Disposition: Pending  Right ICA near occlusion  Cause of right MCA infarct  BP goal 130-150  S/P Right CEA 05/31/15 and right subclavian to carotid bypass   Postop course uneventful  Hypotension, resolved  Had been treated with IV fluids and dopamine  EF 35%   Cardiomyopathy and chronic Systolic CHF  EF 81%35%  Low BP  Cardiology following - appreciate assistance.  Continue coreg, increased to 6.25mg  bid  No ACEI or ARB due to renal function  Lasix 20mg  daily per cardiology  Dysphagia  Dysphagia 2 diet with nectar thick liquids  Able to take po meds   Pt refused NG tube, but OK with PEG if needed  ST - downgraded to Dys 1 pudding on 3/29  ST to reassess MBSS - upgrade to nectar thick again  Dehydration   Elevated creatinine 1.71 -> 2.19->1.99->1.71, Bun  15->27->26->19.   Added IVF  Lasix 20mg  daily  Seizure disorder  Hx of seizure on keppra   Reported event of left arm shaking treated with ativan  EEG showed right slowing, no seizure  Continue keppra 1000mg  bid  Hyperlipidemia  Home meds:  Lipitor 80 mg daily resumed in hospital  LDL 180, goal < 70  Consider adding Zetia once po access.  Continue statin at discharge  Diabetes  HgbA1c 12.6, goal < 7.0  Uncontrolled  Noncompliance  Hold Lantus for now giving NPO status.   Tobacco abuse  Current smoker  Smoking cessation counseling provided  Pt is NOT willing to quit  Other Stroke Risk Factors  Hx stroke/TIA  S/p left CEA  Other Active Problems  Urinary retention. Foley catheter placed.  Hyperkalemia 5.2 - repeat Monday -> 3.6->3.8  B/L AKA  Poor medical compliance  S/P Right  CEA 05/31/15 with excision of a supraclavicular mass -> seborrheic keratosis.  Occasional low-grade fevers ~ 99 - possibly secondary to aspiration - CXR 06/05/2015 - mild bibasilar atelectasis.   [facility] Spoke with social worker Saturday, 06/08/2015. They are hoping a bed will be available early next week. Reiterated that the patient is medically stable for discharge.   Hospital day # 27   Personally examined patient and images, and have participated in and made any corrections needed to history, physical, neuro exam,assessment and plan as stated above.  I have personally obtained the history, evaluated lab date, reviewed imaging studies and agree with radiology interpretations. [facility]   Naomie Dean, MD Stroke Neurology [phone] [facility]      To contact Stroke Continuity provider, please refer to [email or web address]. After hours, contact General Neurology

## 2015-06-10 LAB — GLUCOSE, CAPILLARY
GLUCOSE-CAPILLARY: 159 mg/dL — AB (ref 65–99)
GLUCOSE-CAPILLARY: 243 mg/dL — AB (ref 65–99)
Glucose-Capillary: 193 mg/dL — ABNORMAL HIGH (ref 65–99)
Glucose-Capillary: 175 mg/dL — ABNORMAL HIGH (ref 65–99)
Glucose-Capillary: 195 mg/dL — ABNORMAL HIGH (ref 65–99)
Glucose-Capillary: 210 mg/dL — ABNORMAL HIGH (ref 65–99)

## 2015-06-10 LAB — CULTURE, BLOOD (ROUTINE X 2)
Culture: NO GROWTH
Culture: NO GROWTH

## 2015-06-10 NOTE — Progress Notes (Signed)
STROKE TEAM PROGRESS NOTE   SUBJECTIVE (INTERVAL HISTORY) No family members present. [facility].  Await social worker communication [facility] as he is medically stable for discharge once bed is available. He feels stable today    OBJECTIVE Temp:  [98.3 F (36.8 C)-99.1 F (37.3 C)] 98.4 F (36.9 C) (04/03 0544) Pulse Rate:  [59-67] 67 (04/03 0544) Cardiac Rhythm:  [-] Normal sinus rhythm (04/03 0706) Resp:  [20] 20 (04/03 0544) BP: (119-150)/(48-80) 143/80 mmHg (04/03 0544) SpO2:  [93 %-96 %] 93 % (04/02 2118) Weight:  [166 lb 11.2 oz (75.615 kg)] 166 lb 11.2 oz (75.615 kg) (04/03 0500)  CBC:   Recent Labs Lab 06/04/15 0500 06/05/15 0542  WBC 10.0 9.5  HGB 10.9* 11.1*  HCT 36.2* 36.5*  MCV 86.6 85.9  PLT 376 378   Basic Metabolic Panel:   Recent Labs Lab 06/04/15 0500 06/05/15 0542  NA 141 141  K 3.8 4.1  CL 101 103  CO2 31 29  GLUCOSE 164* 193*  BUN 26* 19  CREATININE 1.99* 1.71*  CALCIUM 8.2* 8.1*   Lipid Panel:     Component Value Date/Time   CHOL 264* 05/24/2015 0357   TRIG 271* 05/24/2015 0357   HDL 30* 05/24/2015 0357   CHOLHDL 8.8 05/24/2015 0357   VLDL 54* 05/24/2015 0357   LDLCALC 180* 05/24/2015 0357   HgbA1c:  Lab Results  Component Value Date   HGBA1C 12.8* 05/24/2015    IMAGING I have personally reviewed the radiological images below and agree with the radiology interpretations.  CT Head Wo Contrast 05/22/2015   1. Subacute nonhemorrhagic infarction high in the right parietal lobe with secondary edema of that portion of the brain without midline shift.  2. Multiple small scalp contusions.   CT head without contrast 05/28/2015 Continued normal expected interval evolution of moderate sized right MCA infarct.  No hemorrhagic transformation or other complication identified.  No other new intracranial process.  Portable chest x-ray 1 view 06/05/2015 Mild bibasilar  atelectasis. 06/03/2015 Persistent postoperative change with soft tissue air in the right supraclavicular region. Mild bibasilar atelectatic change. No edema or consolidation. Stable cardiac prominence. 05/31/2015 1. Left jugular catheter terminates in the region of the left brachiocephalic vein. 2. Hypoinflation with left basilar atelectasis versus consolidation.  Mr Maxine GlennMra Head Wo Contrast 05/23/2015   Motion degraded. No emergent large vessel occlusion to correspond with the acute posterior right MCA infarct.   Mr Brain Wo Contrast 05/23/2015   1. Acute posterior right MCA territory infarct corresponding to the abnormal CT appearance yesterday. Cytotoxic edema without intracranial mass effect at this time.  2. Intracranial MRA from today reported separately.  3. Moderately to severely motion degraded and the examination had to be discontinued prior to completion due to patient agitation.  4. Consider surveillance for associated intracranial hemorrhage and mass effect with noncontrast CT. (see above)  CT angiogram of the neck with and without contrast 05/30/2015 1. New High-grade Radiographic-String-Sign stenosis of the Right CCA at the level of the larynx since 2011. Bulky 17 mm Thrombus distal to this stenosis adherent to the wall of the vessel at this time. 2. Surgery to the left carotid bifurcation since 2011 with restored patency of the left ICA origin. Progressed soft plaque in the left CCA resulting in stenosis of 50-60% at the level of the thyroid cartilage. 3. 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 No distal reconstituted flow. 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 4. Right vertebral artery progressed soft plaque in the V2 segment at the C3 level with new severe stenosis. Increased soft plaque in the V4 segment with new moderate stenosis. 5. Layering pleural effusions, up to moderate on the left and small on the right. 6. Poor dentition. Chronic  right mastoid inflammation.  US Renal 05/23/2015   1. No hydronephrosis.  2. Echogenic normal size kidneys, indicating nonspecific renal parenchymal disease of uncertain chronicity.  3. Mild diffuse bladder wall thickening and trabeculation, suggesting chronic bladder voiding dysfunction such as due to chronic bladder outlet obstruction by an enlarged prostate gland. The prostate gland is not evaluated on this scan.   US Carotid Bilateral 05/23/2015   Right: Nearly occlusive centralized thrombus/plaque within the right common carotid artery which contributes to post stenotic and decreased antegrade flow of the right ICA. This plaque is favored to represent acute embolism/thrombus given the morphology. Formal cerebral angiogram is recommended. As a second best test, CTA. Left: The patient is status post left carotid endarterectomy in September 2011, with atherosclerotic changes of the common carotid artery. Established duplex criteria have not been validated for assessment after surgery, however, there is no evidence of compromised flow. High diastolic component of the flow on the left suggests increase contribution, potentially to the right hemisphere given the small infarction on recent CT.   EEG - This is an abnormal EEG demonstrating posterior slowing on the right compared to that of the left, consistent with the patient's history of a new right PCA infarct. There was no epileptiform activity recorded on this recording.  2D echo - Upper normal LV chamber size with mild LVH and LVEF approximately 35%. There is diffuse hypokinesis with akinesis of the basal inferolateral/inferior wall. Grade 1 diastolic dysfunction with indeterminate filling pressure. Upper normal left atrial chamber size. Trivial mitral regurgitation. No obvious PFO or ASD.  Modified Barium Swallow with speech therapy 06/06/2015 Moderate aspiration risk. Continue dysphagia 2 diet with nectar thick liquids    PHYSICAL  EXAM Temp:  [98.3 F (36.8 C)-99.1 F (37.3 C)] 98.4 F (36.9 C) (04/03 0544) Pulse Rate:  [59-67] 67 (04/03 0544) Resp:  [20] 20 (04/03 0544) BP: (119-150)/(48-80) 143/80 mmHg (04/03 0544) SpO2:  [93 %-96 %] 93 % (04/02 2118) Weight:  [166 lb 11.2 oz (75.615 kg)] 166 lb 11.2 oz (75.615 kg) (04/03 0500)  General - cachetic looking, well developed, in no apparent distress. Cardiovascular - Regular rate and rhythm. Chest:  CTA Abd:  Soft NT, ND normal bowel sounds Extremities:  Left AKA and right BKA  Mental Status -  Awake alert, orientation to year (not month), place, and person were intact. Language including expression, naming, repetition, comprehension was assessed and found intact.  Cranial Nerves II - XII - II -  . Does not blink to threat bilaterally and poor effort on exam when testing EOM. But lateral gaze bilaterally  intact when I move to each side  III, IV, VI - lateral gaze intact  V - Facial sensation intact bilaterally. VII - Facial movement intact bilaterally. VIII - Hearing & vestibular intact bilaterally. X - Palate elevates symmetrically. XI - Chin turning & shoulder shrug intact bilaterally. XII - Tongue protrusion intact.  Motor Strength - The patient's strength was 1/5 LUE and left leg stump, 5/5 RUE and right leg stump, bilateral AKA.  Bulk was normal and fasciculations were absent.   Motor Tone - Muscle tone was assessed at the neck and appendages and  was normal.  Sensory - Light touch, temperature/pinprick were assessed and were symmetrical.    Coordination - The patient had normal movements in the right hand with no ataxia or dysmetria.    Gait and Station - not able to test.   ASSESSMENT/PLAN Ian Garcia is a 60 y.o. male with history of DM, seizures, htn, previous stroke, hld, and carotid stenosis presenting with acute onset of left-sided weakness. He did not receive IV t-PA due to late presentation.  Stroke:  Non-dominant right MCA  territory infarct thomboembolic secondary to large vessel disease from near right carotid artery occlusion status post resultant right CEA this admission  Resultant  Left hemiplegia, dysphagia  MRI  Acute posterior right MCA territory infarct with cytotoxic edema  MRA  No emergent large vessel occlusion to correspond with the acute posterior right MCA infarct.   Carotid Doppler - near occlusion of right carotid artery.   CTA of neck - New High-grade Radiographic-String-Sign stenosis of the Right CCA with thrombus.  2D Echo - EF 35%  LDL - 180  HgbA1c 12.6  VTE prophylaxis - subcutaneous heparin DIET DYS 2 Room service appropriate?: Yes; Fluid consistency:: Nectar Thick high risk for aspiration - ST following  clopidogrel 75 mg daily prior to admission, now on aspirin 325 mg daily and clopidogrel 75 mg daily.  Patient counseled to be compliant with his antithrombotic medications  Ongoing aggressive stroke risk factor management  D/c central line  Therapy recommendations:  [facility]  Disposition: Pending  Right ICA near occlusion  Cause of right MCA infarct  BP goal 130-150  S/P Right CEA 05/31/15 and right subclavian to carotid bypass   Postop course uneventful  Hypotension, resolved  Had been treated with IV fluids and dopamine  EF 35%   Cardiomyopathy and chronic Systolic CHF  EF 36%  Low BP  Cardiology following - appreciate assistance.  Continue coreg, increased to 6.25mg  bid  No ACEI or ARB due to renal function  Lasix  daily per cardiology  Dysphagia  Dysphagia 2 diet with nectar thick liquids  Able to take po meds   Pt refused NG tube, but OK with PEG if needed  ST - downgraded to Dys 1 pudding on 3/29  ST to reassess MBSS - upgrade to nectar thick again  Dehydration   Elevated creatinine 1.71 -> 2.19->1.99->1.71, Bun 15->27->26->19.   Added IVF  Lasix  daily  Seizure  disorder  Hx of seizure on keppra   Reported event of left arm shaking treated with ativan  EEG showed right slowing, no seizure  Continue keppra  bid  Hyperlipidemia  Home meds:  Lipitor 80 mg daily resumed in hospital  LDL 180, goal < 70  Consider adding Zetia once po access.  Continue statin at discharge  Diabetes  HgbA1c 12.6, goal < 7.0  Uncontrolled  Noncompliance  Hold Lantus for now giving NPO status.   Tobacco abuse  Current smoker  Smoking cessation counseling provided  Pt is NOT willing to quit  Other Stroke Risk Factors  Hx stroke/TIA  S/p left CEA  Other Active Problems  Urinary retention. Foley catheter placed.  Hyperkalemia 5.2 - repeat Monday -> 3.6->3.8  B/L AKA  Poor medical compliance  S/P Right CEA 05/31/15 with excision of a supraclavicular mass -> seborrheic keratosis.  Occasional low-grade fevers ~ 99 - possibly secondary to aspiration - CXR 06/05/2015 - mild bibasilar atelectasis.   [facility] Spoke with social worker Saturday, 06/08/2015. They are hoping a bed will be available early next week. Reiterated that the patient is medically stable for discharge.   Hospital day # 15   Personally examined patient and images, and have participated in and made any corrections needed to history, physical, neuro exam,assessment and plan as stated above.  I have personally obtained the history, evaluated lab date, reviewed imaging studies and agree with radiology interpretations. [facility]   Delia Heady, , MD Stroke Neurology       To contact Stroke Continuity provider, please refer to [email or web address]. After hours, contact General Neurology

## 2015-06-10 NOTE — Progress Notes (Signed)
[facility]. CS[facility] to get [facility] [phone] to explore additional facilities. However, CSW received no answer. CSW left voice message to follow up regarding other available facilities.   Patient has [facility] declined at:  [facility], [place], [place]- [facility], [place], [place], [place], [place]  Referrals sent to the following facilities:  [facility], [place], [place]- left a voicemail today regarding referral.   [facility], [place], [place]- Attempted to contact facility three times. Unable to leave voice message; line busy.

## 2015-06-10 NOTE — Care Management Note (Signed)
Case Management Note  Patient Details  Name: Nuala AlphaBruce L Mott MRN: [id number] Date of Birth: [birth year]  Subjective/Objective:                    Action/Plan: Plan is [facility]. CM continuing to follow for d/c needs.   Expected Discharge Date:                  Expected Discharge Plan:  Skilled Nursing Facility  In-House Referral:  Clinical Social Work  Discharge planning Services  CM Consult  Post Acute Care Choice:    Choice offered to:     DME Arranged:    DME Agency:     HH Arranged:    HH Agency:     Status of Service:  Completed, signed off  Medicare Important Message Given:    Date Medicare IM Given:    Medicare IM give by:    Date Additional Medicare IM Given:    Additional Medicare Important Message give by:     If discussed at Long Length of Stay Meetings, dates discussed:    Additional Comments:  Kermit BaloKelli F Kayde Warehime, RN 06/10/2015, 11:55 AM

## 2015-06-10 NOTE — Progress Notes (Signed)
Speech Language Pathology Treatment: Dysphagia, cognition Patient Details Name: Ian Garcia MRN: [id number] DOB: 12-31-1955 Today's Date: 06/10/2015 Time: [id number] SLP Time Calculation (min) (ACUTE ONLY): 12 min  Assessment / Plan / Recommendation Clinical Impression  Pt in good spirits today, joking with staff.  Toleration of dysphagia 2, nectar-thick liquids is better than when last seen on Friday of last week.  Occasional episodes of cough noted after POs, but when he feeds himself (as opposed to relying on others for feeding) he is better able to self-regulate with improved attention to left and fewer incidents of coughing with min verbal cues provided overall.  Lungs are clear and he has been afebrile.  Continues with cognitive impairment, poor insight into deficits, but is calmer and more socially appropriate today. Continue SLP for both dysphagia and cognition.    HPI HPI: 60 y.o. male with h/o DM, diabetic neuropathy, seizures, HTN, acid reflux, hypercholesteremia, peripheral vascular disease due to secondary DM, CVA, carotid stenosis s/p CEA on L and tobacco abuse, who presented to ED with acute L side weakness since 3/14. MR Brain 3/16 acute posterior R MCA territory infarct. MRA Head 3/16 no emergent large vessel occlusion to correspond with acute posterior R MCA infarct.      SLP Plan  Continue with current plan of care     Recommendations  Diet recommendations: Dysphagia 2 (fine chop);Nectar-thick liquid Liquids provided via: Cup Medication Administration: Whole meds with puree Supervision: Patient able to self feed;Staff to assist with self feeding Compensations: Slow rate;Small sips/bites;Effortful swallow Postural Changes and/or Swallow Maneuvers: Seated upright 90 degrees             Plan: Continue with current plan of care     Ian Garcia L. Ian Garcia, [place] CCC/SLP Pager [phone]                 Ian Garcia, Ian Garcia 06/10/2015, 11:44 AM

## 2015-06-11 ENCOUNTER — Encounter: Payer: Self-pay | Admitting: Vascular Surgery

## 2015-06-11 LAB — GLUCOSE, CAPILLARY
GLUCOSE-CAPILLARY: 162 mg/dL — AB (ref 65–99)
GLUCOSE-CAPILLARY: 143 mg/dL — AB (ref 65–99)
GLUCOSE-CAPILLARY: 152 mg/dL — AB (ref 65–99)
GLUCOSE-CAPILLARY: 179 mg/dL — AB (ref 65–99)
Glucose-Capillary: 150 mg/dL — ABNORMAL HIGH (ref 65–99)
Glucose-Capillary: 201 mg/dL — ABNORMAL HIGH (ref 65–99)
Glucose-Capillary: 257 mg/dL — ABNORMAL HIGH (ref 65–99)

## 2015-06-11 NOTE — Progress Notes (Signed)
STROKE TEAM PROGRESS NOTE   SUBJECTIVE (INTERVAL HISTORY) No family members present. Awaiting a bed at the [facility] SN[facility].  Await social worker communication about bed approval at [facility] facility as he is medically stable for discharge once bed is available. He feels stable today He has no complaints but is frustated with delay in transfer to SNF   OBJECTIVE Temp:  [98.2 F (36.8 C)-98.4 F (36.9 C)] 98.2 F (36.8 C) (04/04 0628) Pulse Rate:  [57-66] 66 (04/04 0628) Cardiac Rhythm:  [-] Sinus bradycardia (04/04 0700) Resp:  [16-17] 16 (04/04 0628) BP: (117-136)/(36-67) 134/67 mmHg (04/04 0628) SpO2:  [93 %-96 %] 95 % (04/04 0628)  CBC:   Recent Labs Lab 06/05/15 0542  WBC 9.5  HGB 11.1*  HCT 36.5*  MCV 85.9  PLT 378   Basic Metabolic Panel:   Recent Labs Lab 06/05/15 0542  NA 141  K 4.1  CL 103  CO2 29  GLUCOSE 193*  BUN 19  CREATININE 1.71*  CALCIUM 8.1*   Lipid Panel:     Component Value Date/Time   CHOL 264* 05/24/2015 0357   TRIG 271* 05/24/2015 0357   HDL 30* 05/24/2015 0357   CHOLHDL 8.8 05/24/2015 0357   VLDL 54* 05/24/2015 0357   LDLCALC 180* 05/24/2015 0357   HgbA1c:  Lab Results  Component Value Date   HGBA1C 12.8* 05/24/2015    IMAGING I have personally reviewed the radiological images below and agree with the radiology interpretations.  CT Head Wo Contrast 05/22/2015   1. Subacute nonhemorrhagic infarction high in the right parietal lobe with secondary edema of that portion of the brain without midline shift.  2. Multiple small scalp contusions.   CT head without contrast 05/28/2015 Continued normal expected interval evolution of moderate sized right MCA infarct.  No hemorrhagic transformation or other complication identified.  No other new intracranial process.  Portable chest x-ray 1 view 06/05/2015 Mild bibasilar atelectasis. 06/03/2015 Persistent postoperative change with soft tissue air in the right supraclavicular region. Mild bibasilar  atelectatic change. No edema or consolidation. Stable cardiac prominence. 05/31/2015 1. Left jugular catheter terminates in the region of the left brachiocephalic vein. 2. Hypoinflation with left basilar atelectasis versus consolidation.  [name] Head Wo Contrast 05/23/2015   Motion degraded. No emergent large vessel occlusion to correspond with the acute posterior right MCA infarct.   Mr Brain Wo Contrast 05/23/2015   1. Acute posterior right MCA territory infarct corresponding to the abnormal CT appearance yesterday. Cytotoxic edema without intracranial mass effect at this time.  2. Intracranial MRA from today reported separately.  3. Moderately to severely motion degraded and the examination had to be discontinued prior to completion due to patient agitation.  4. Consider surveillance for associated intracranial hemorrhage and mass effect with noncontrast CT. (see above)  CT angiogram of the neck with and without contrast 05/30/2015 1. New High-grade Radiographic-String-Sign stenosis of the Right CCA at the level of the larynx since 2011. Bulky 17 mm Thrombus distal to this stenosis adherent to the wall of the vessel at this time. 2. Surgery to the left carotid bifurcation since 2011 with restored patency of the left ICA origin. Progressed soft plaque in the left CCA resulting in stenosis of 50-60% at the level of the thyroid cartilage. 3. 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 No distal reconstituted flow. 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 4. Right vertebral artery progressed soft plaque in the V2 segment at the C3 level with new  severe stenosis. Increased soft plaque in the V4 segment with new moderate stenosis. 5. Layering pleural effusions, up to moderate on the left and small on the right. 6. Poor dentition. Chronic right mastoid inflammation.  [place] Renal 05/23/2015   1. No hydronephrosis.  2. Echogenic normal size kidneys, indicating  nonspecific renal parenchymal disease of uncertain chronicity.  3. Mild diffuse bladder wall thickening and trabeculation, suggesting chronic bladder voiding dysfunction such as due to chronic bladder outlet obstruction by an enlarged prostate gland. The prostate gland is not evaluated on this scan.   [place] Carotid Bilateral 05/23/2015   Right: Nearly occlusive centralized thrombus/plaque within the right common carotid artery which contributes to post stenotic and decreased antegrade flow of the right ICA. This plaque is favored to represent acute embolism/thrombus given the morphology. Formal cerebral angiogram is recommended. As a second best test, CTA. Left: The patient is status post left carotid endarterectomy in September 2011, with atherosclerotic changes of the common carotid artery. Established duplex criteria have not been validated for assessment after surgery, however, there is no evidence of compromised flow. High diastolic component of the flow on the left suggests increase contribution, potentially to the right hemisphere given the small infarction on recent CT.   EEG - This is an abnormal EEG demonstrating posterior slowing on the right compared to that of the left, consistent with the patient's history of a new right PCA infarct. There was no epileptiform activity recorded on this recording.  2D echo - Upper normal LV chamber size with mild LVH and LVEF approximately 35%. There is diffuse hypokinesis with akinesis of the basal inferolateral/inferior wall. Grade 1 diastolic dysfunction with indeterminate filling pressure. Upper normal left atrial chamber size. Trivial mitral regurgitation. No obvious PFO or ASD.  Modified Barium Swallow with speech therapy 06/06/2015 Moderate aspiration risk. Continue dysphagia 2 diet with nectar thick liquids    PHYSICAL EXAM Temp:  [98.2 F (36.8 C)-98.4 F (36.9 C)] 98.2 F (36.8 C) (04/04 0628) Pulse Rate:  [57-66] 66 (04/04 0628) Resp:   [16-17] 16 (04/04 0628) BP: (117-136)/(36-67) 134/67 mmHg (04/04 0628) SpO2:  [93 %-96 %] 95 % (04/04 0628)  General - cachetic looking, well developed, in no apparent distress. Cardiovascular - Regular rate and rhythm. Chest:  CTA Abd:  Soft NT, ND normal bowel sounds Extremities:  Left AKA and right BKA  Mental Status -  Awake alert, orientation to year (not month), place, and person were intact. Language including expression, naming, repetition, comprehension was assessed and found intact.  Cranial Nerves II - XII - II -  . Does not blink to threat bilaterally and poor effort on exam when testing EOM. But lateral gaze bilaterally  intact when I move to each side  III, IV, VI - lateral gaze intact  V - Facial sensation intact bilaterally. VII - Facial movement intact bilaterally. VIII - Hearing & vestibular intact bilaterally. X - Palate elevates symmetrically. XI - Chin turning & shoulder shrug intact bilaterally. XII - Tongue protrusion intact.  Motor Strength - The patient's strength was 1/5 LUE and left leg stump, 5/5 RUE and right leg stump, bilateral AKA.  Bulk was normal and fasciculations were absent.   Motor Tone - Muscle tone was assessed at the neck and appendages and was normal.  Sensory - Light touch, temperature/pinprick were assessed and were symmetrical.    Coordination - The patient had normal movements in the right hand with no ataxia or dysmetria.    Gait and Station -  not able to test.   ASSESSMENT/PLAN Mr. Ian Garcia is a 60 y.o. male with history of DM, seizures, htn, previous stroke, hld, and carotid stenosis presenting with acute onset of left-sided weakness. He did not receive IV t-PA due to late presentation.  Stroke:  Non-dominant right MCA territory infarct thomboembolic secondary to large vessel disease from near right carotid artery occlusion status post resultant right CEA this admission  Resultant  Left hemiplegia, dysphagia  MRI  Acute  posterior right MCA territory infarct with cytotoxic edema  MRA  No emergent large vessel occlusion to correspond with the acute posterior right MCA infarct.   Carotid Doppler - near occlusion of right carotid artery.   CTA of neck - New High-grade Radiographic-String-Sign stenosis of the Right CCA with thrombus.  2D Echo - EF 35%  LDL - 180  HgbA1c 12.6  VTE prophylaxis - subcutaneous heparin DIET DYS 2 Room service appropriate?: Yes; Fluid consistency:: Nectar Thick high risk for aspiration - ST following  clopidogrel 75 mg daily prior to admission, now on aspirin 325 mg daily and clopidogrel 75 mg daily.  Patient counseled to be compliant with his antithrombotic medications  Ongoing aggressive stroke risk factor management  D/c central line  Therapy recommendations:  [facility]  Disposition: Pending  Right ICA near occlusion  Cause of right MCA infarct  BP goal 130-150  S/P Right CEA 05/31/15 and right subclavian to carotid bypass   Postop course uneventful  Hypotension, resolved  Had been treated with IV fluids and dopamine  EF 35%   Cardiomyopathy and chronic Systolic CHF  EF 16%  Low BP  Cardiology following - appreciate assistance.  Continue coreg, increased to 6.25mg  bid  No ACEI or ARB due to renal function  Lasix 20mg  daily per cardiology  Dysphagia  Dysphagia 2 diet with nectar thick liquids  Able to take po meds   Pt refused NG tube, but OK with PEG if needed  ST - downgraded to Dys 1 pudding on 3/29  ST to reassess MBSS - upgrade to nectar thick again  Dehydration   Elevated creatinine 1.71 -> 2.19->1.99->1.71, Bun 15->27->26->19.   Added IVF  Lasix 20mg  daily  Seizure disorder  Hx of seizure on keppra   Reported event of left arm shaking treated with ativan  EEG showed right slowing, no seizure  Continue keppra 1000mg  bid  Hyperlipidemia  Home meds:  Lipitor 80 mg daily  resumed in hospital  LDL 180, goal < 70  Consider adding Zetia once po access.  Continue statin at discharge  Diabetes  HgbA1c 12.6, goal < 7.0  Uncontrolled  Noncompliance  Hold Lantus for now giving NPO status.   Tobacco abuse  Current smoker  Smoking cessation counseling provided  Pt is NOT willing to quit  Other Stroke Risk Factors  Hx stroke/TIA  S/p left CEA  Other Active Problems  Urinary retention. Foley catheter placed.  Hyperkalemia 5.2 - repeat Monday -> 3.6->3.8  B/L AKA  Poor medical compliance  S/P Right CEA 05/31/15 with excision of a supraclavicular mass -> seborrheic keratosis.  Occasional low-grade fevers ~ 99 - possibly secondary to aspiration - CXR 06/05/2015 - mild bibasilar atelectasis.   [facility] Spoke with social worker Saturday, 06/08/2015. They are hoping a bed will be available early next week. Reiterated that the patient is medically stable for discharge.   Hospital day # 20   Personally examined patient  and images, and have participated in and made any corrections needed to history, physical, neuro exam,assessment and plan as stated above.  I have personally obtained the history, evaluated lab date, reviewed imaging studies and agree with radiology interpretations. [facility]   Delia Heady, , MD Stroke Neurology       To contact Stroke Continuity provider, please refer to [email or web address]. After hours, contact General Neurology

## 2015-06-11 NOTE — Progress Notes (Signed)
Physical Therapy Treatment Patient Details Name: Ian Garcia MRN: [id number] DOB: [birth year] Today's Date: 06/11/2015    History of Present Illness Patient is a 60 yo male admitted 05/22/15 with Lt sided weakness and decreased sensation.  MRI showed acute Rt MCA infarct.  Patient also with thrombus in Rt ICA.    Pt is now s/p Rt carotid endarterectomy and Rt subclavian to carotid bypass.  PMH:  DM, neuropathy, PVD, bilateral AKA's, HTN, seizure disorder, CVA, carotid stenosis, tobacco use, CHF with EF 35%    PT Comments    Pt able to sit EOB x 15 minutes this session. Still max/total A due to extensor tone and pushing with R UE  Follow Up Recommendations  SNF;Supervision/Assistance - 24 hour     Equipment Recommendations  None recommended by PT    Recommendations for Other Services       Precautions / Restrictions Precautions Precautions: Fall Precaution Comments: B amputation Restrictions Weight Bearing Restrictions: No    Mobility  Bed Mobility     Rolling: Max assist Sidelying to sit: Total assist Supine to sit: Total assist Sit to supine: Total assist   General bed mobility comments: pt able to roll to Lt with max A and use of bed rail. Pt total A to come from supine to sit with use of bed rail. Pt total A for sit to supine and positioning in bed. Pt limited by posterior lean and R UE pushing into extension during functional tasks  Transfers                    Ambulation/Gait                 Stairs            Wheelchair Mobility    Modified Rankin (Stroke Patients Only)       Balance       Sitting balance - Comments: pt performed sitting balance EOB x 15 minutes initially with total A, progressing to min/mod A with cuing for abdominal mm contraction for forward wt shift and resting on Rt elbow to reduce pushing with R UE. Pt continues with strong posterior lean and extension tone limiting sitting balance                            Cognition Arousal/Alertness: Awake/alert Behavior During Therapy: Flat affect                 Problem Solving: Decreased initiation;Requires verbal cues;Requires tactile cues;Slow processing      Exercises      General Comments        Pertinent Vitals/Pain Pain Assessment: No/denies pain Pain Score: 0-No pain    Home Living                      Prior Function            PT Goals (current goals can now be found in the care plan section) Progress towards PT goals: Progressing toward goals    Frequency  Min 2X/week    PT Plan Current plan remains appropriate    Co-evaluation             End of Session Equipment Utilized During Treatment: Oxygen Activity Tolerance: Patient tolerated treatment well Patient left: in bed;with call bell/phone within reach;with bed alarm set     Time: 0800-0827 PT Time Calculation (min) (ACUTE ONLY): 27  min  Charges:  $Therapeutic Activity: 23-37 mins                    G Codes:      Kyung Muto 06/11/2015, 9:22 AM

## 2015-06-11 NOTE — Progress Notes (Signed)
Patient has been accepted at [facility] [address] [place] [id number]. Per [employer] representative Colleen, they will not be able to accept the patient on today and was advised to schedule it for tomorrow. Jill SideColleen [place] [facility] [place] to inform for [place]. CSW will also contact [facility] representative Annie ParasLakesha Barnes at [phone] to inform. [facility] should assist with [facility] to facility.   CSW to make patient aware of current plan once plan is confirmed. CSW will continue to follow and provide support to patient while in hospital.   Baldo DaubJolan Belita Warsame, Student Social Work Intern  [facility]  [phone]

## 2015-06-11 NOTE — Progress Notes (Signed)
Pt attempted to void earlier at 2100 but couldn't, bladder scan done, only about 136ml of urine was scanned, pt encouraged to drink more at this time, bladder scan repeated at 0130, scanned greater than 438ml, drained out 500ml at 0200, pt made comfortable in bed, will continue to monitor.

## 2015-06-11 NOTE — Progress Notes (Deleted)
STROKE TEAM PROGRESS NOTE   SUBJECTIVE (INTERVAL HISTORY) Patient upset that he remains in the hospital. [facility]. No change in patient condition overnight. He continues to be alert and eating well.   OBJECTIVE Temp:  [98.1 F (36.7 C)-98.4 F (36.9 C)] 98.2 F (36.8 C) (04/04 0628) Pulse Rate:  [57-66] 66 (04/04 0628) Cardiac Rhythm:  [-] Sinus bradycardia (04/04 0700) Resp:  [16-18] 16 (04/04 0628) BP: (117-138)/(36-67) 134/67 mmHg (04/04 0628) SpO2:  [93 %-96 %] 95 % (04/04 0628)   PHYSICAL EXAM Temp:  [98.1 F (36.7 C)-98.4 F (36.9 C)] 98.2 F (36.8 C) (04/04 0628) Pulse Rate:  [57-66] 66 (04/04 0628) Resp:  [16-18] 16 (04/04 0628) BP: (117-138)/(36-67) 134/67 mmHg (04/04 0628) SpO2:  [93 %-96 %] 95 % (04/04 0628)  General - cachetic looking, well developed, in no apparent distress. Cardiovascular - Regular rate and rhythm. Chest:  CTA Abd:  Soft NT, ND normal bowel sounds Extremities:  Left AKA and right BKA  Mental Status -  Awake alert, orientation to year (not month), place, and person were intact. Language including expression, naming, repetition, comprehension was assessed and found intact.  Cranial Nerves II - XII - II -  . Does not blink to threat bilaterally and poor effort on exam when testing EOM. But lateral gaze bilaterally  intact when I move to each side  III, IV, VI - lateral gaze intact  V - Facial sensation intact bilaterally. VII - Facial movement intact bilaterally. VIII - Hearing & vestibular intact bilaterally. X - Palate elevates symmetrically. XI - Chin turning & shoulder shrug intact bilaterally. XII - Tongue protrusion intact.  Motor Strength - The patient's strength was 1/5 LUE and left leg stump, 5/5 RUE and right leg stump, bilateral AKA.  Bulk was normal and fasciculations were absent.   Motor Tone - Muscle tone was assessed at the neck and appendages and was normal.  Sensory - Light touch,  temperature/pinprick were assessed and were symmetrical.    Coordination - The patient had normal movements in the right hand with no ataxia or dysmetria.    Gait and Station - not able to test.   ASSESSMENT/PLAN Ian Garcia is a 60 y.o. male with history of DM, seizures, htn, previous stroke, hld, and carotid stenosis presenting with acute onset of left-sided weakness. He did not receive IV t-PA due to late presentation.  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   Carotid Doppler - near occlusion of right carotid artery.   CTA of neck - New High-grade Radiographic-String-Sign stenosis of the Right CCA with thrombus.  2D Echo - EF 35%  LDL - 180  HgbA1c 12.6  VTE prophylaxis - subcutaneous heparin DIET DYS 2 Room service appropriate?: Yes; Fluid consistency:: Nectar Thick high risk for aspiration - ST following  clopidogrel 75 mg daily prior to admission, now on aspirin 325 mg daily and clopidogrel 75 mg daily.  Patient counseled to be compliant with his antithrombotic medications  Ongoing aggressive stroke risk factor management  Therapy recommendations:  [facility] - have had some denials - awaiting response  from others  Disposition: Pending. the patient is medically stable for discharge.  Right ICA near occlusion  Cause of right MCA infarct  BP goal 130-150  S/P Right CEA 05/31/15 and right subclavian to carotid bypass   Postop course uneventful  Hypotension, resolved  Had been treated with IV fluids and dopamine  EF 35%   Cardiomyopathy and chronic Systolic CHF  EF  35%  Low BP  Cardiology following - appreciate assistance.  Continue coreg, increased to 6.25mg  bid  No ACEI or ARB due to renal function  Lasix 20mg  daily per cardiology  Dysphagia  Dysphagia 2 diet with nectar thick liquids (upgrading from previous honey thick liquids)  Patient able to feed himself  Speech therapy following  Dehydration   Elevated creatinine 1.71 -> 2.19->1.99->1.71, Bun 15->27->26->19.   Added IVF  Lasix 20mg  daily  Seizure disorder  Hx of seizure on keppra   Reported event of left arm shaking treated with ativan  EEG showed right slowing, no seizure  Continue keppra 1000mg  bid  Hyperlipidemia  Home meds:  Lipitor 80 mg daily resumed in hospital  LDL 180, goal < 70  Consider adding Zetia once po access.  Continue statin at discharge  Diabetes  HgbA1c 12.6, goal < 7.0  Uncontrolled  Noncompliance  Hold Lantus for now giving NPO status.   Tobacco abuse  Current smoker  Smoking cessation counseling provided  Pt is NOT willing to quit  Other Stroke Risk Factors  Hx stroke/TIA  S/p left CEA  Other Active Problems  Urinary retention. Foley catheter placed.  Hyperkalemia 5.2 - repeat Monday -> 3.6->3.8  B/L AKA  Poor medical compliance  S/P Right CEA 05/31/15 with excision of a supraclavicular mass -> seborrheic keratosis.  Occasional low-grade fevers ~ 99 - possibly secondary to aspiration - CXR 06/05/2015 - mild bibasilar atelectasis.   Hospital day # 20   Ian MoodyBIBY,Ian  [facility] See Amion for Pager information 06/11/2015 4:13 PM    To contact Stroke Continuity provider, please refer to [email or web address]. After hours, contact General Neurology

## 2015-06-11 NOTE — Progress Notes (Signed)
Occupational Therapy Treatment Patient Details Name: Ian Garcia MRN: [id number] DOB: 17-Feb-1956 Today's Date: 06/11/2015    History of present illness Patient is a 60 y.o. male admitted 05/22/15 with Lt sided weakness and decreased sensation.  MRI showed acute Rt MCA infarct.  Patient also with thrombus in Rt ICA.    Pt is now s/p Rt carotid endarterectomy and Rt subclavian to carotid bypass.  PMH:  DM, neuropathy, PVD, bilateral AKA's, HTN, seizure disorder, CVA, carotid stenosis, tobacco use, CHF with EF 35%   OT comments  Continue to recommend SNF at d/c. Performed grooming tasks in bed.  Follow Up Recommendations  SNF    Equipment Recommendations  Other (comment) (TBD at SNF)    Recommendations for Other Services      Precautions / Restrictions Precautions Precautions: Fall Precaution Comments: Bilateral amputation Restrictions Weight Bearing Restrictions: No       Mobility Bed Mobility Overal bed mobility: Needs Assistance;+2 for physical assistance Bed Mobility: Rolling Rolling: +2 for physical assistance;Total assist; see comments   General bed mobility comments: Pt able to roll back onto his back with Min assist. Heavy assist to roll towards left rail and towards right rail.                            ADL Overall ADL's : Needs assistance/impaired     Grooming: Wash/dry face;Bed level;Moderate assistance (washed off hands with washcloth and applied lotion to hands) Grooming Details (indicate cue type and reason): able to wash face with Rt hand; assist with lotioning hands and washing hands; pt wrung out washcloth; assist with squeezing lotion on hand and closing bottle and lotioning hands; positioned bottle in left hand so pt could use it as assist.       Lower Body Bathing Details (indicate cue type and reason): pt washed off RLE and peri area; assist to wash LLE; Pt washed off peri area; Total +2 to clean buttocks from BM Upper Body Dressing :  Total assistance;Bed level Upper Body Dressing Details (indicate cue type and reason): pt participated a little with managing gown         Toileting- Clothing Manipulation and Hygiene: +2 for physical assistance;Bed level;Maximal assistance Toileting - Clothing Manipulation Details (indicate cue type and reason): incontinent of BM     Functional mobility during ADLs:  (see bed mobility section) General ADL Comments: Showed pt how to use LUE as support for grooming item.      Vision                     Perception     Praxis      Cognition  Awake/Alert Behavior During Therapy: Flat affect Overall Cognitive Status: No family/caregiver present to determine baseline cognitive functioning Area of Impairment: Orientation;Problem solving Orientation Level: Disoriented to;Place            Problem Solving: Decreased initiation;Slow processing;Requires verbal cues      Extremity/Trunk Assessment               Exercises     Shoulder Instructions       General Comments      Pertinent Vitals/ Pain       Pain Assessment: Faces Pain Score: 0-No pain Faces Pain Scale: Hurts even more Pain Location: LLE Pain Descriptors / Indicators: Grimacing;Other (Comment) (made noise that indicated pain) Pain Intervention(s): Monitored during session;Repositioned  Home Living  Prior Functioning/Environment              Frequency Min 2X/week     Progress Toward Goals  OT Goals(current goals can now be found in the care plan section)  Progress towards OT goals: Progressing toward goals-updated goals and kept some the same  Acute Rehab OT Goals Patient Stated Goal: none stated OT Goal Formulation: With patient Time For Goal Achievement: 06/18/15 Potential to Achieve Goals: Good ADL Goals Pt Will Perform Lower Body Bathing: bed level;with mod assist Pt Will Perform Upper Body Dressing: with max  assist;bed level Additional ADL Goal #1: Pt will be able to roll left with mod A to A with basic ADLs at bed level Additional ADL Goal #2: Pt will be able to sit EOB with no more than Mod A for 5 minutes Additional ADL Goal #3: Pt will be able to locate items to his left with with min cues to find them Additional ADL Goal #4: pt will initiate looking at LUE at least once a session without cues and he will wash hands/apply lotion with set up/min A  to open containters and wring out cloth  Plan Discharge plan remains appropriate    Co-evaluation                 End of Session Equipment Utilized During Treatment: Oxygen   Activity Tolerance Patient tolerated treatment well   Patient Left in bed;with call bell/phone within reach;with bed alarm set;Other (comment) (nurse tech in room)   Nurse Communication          Time: [phone] OT Time Calculation (min): 20 min  Charges: OT General Charges $OT Visit: 1 Procedure OT Treatments $Self Care/Home Management : 8-22 mins  Earlie Raveling OTR/L [phone] 06/11/2015, 11:27 AM

## 2015-06-11 NOTE — Clinical Social Work Note (Cosign Needed)
Clinical information about the patient was sent to the following facilities: [facility]; [facility] and [facility].  CSW will continue to follow patient's discharge and provide needed assistance.   Baldo DaubJolan [place]ar [place], Student Social Work Intern  [facility]  [phone]

## 2015-06-11 NOTE — Plan of Care (Signed)
Problem: Nutrition: Goal: Risk of aspiration will decrease Outcome: Progressing Pt educated about risk of aspiration. Pt understands to drink nectar-thick liquids. Pt in high-fowlers position during meal times. Will continue to monitor.

## 2015-06-12 LAB — GLUCOSE, CAPILLARY
GLUCOSE-CAPILLARY: 138 mg/dL — AB (ref 65–99)
GLUCOSE-CAPILLARY: 149 mg/dL — AB (ref 65–99)
Glucose-Capillary: 127 mg/dL — ABNORMAL HIGH (ref 65–99)
Glucose-Capillary: 143 mg/dL — ABNORMAL HIGH (ref 65–99)
Glucose-Capillary: 162 mg/dL — ABNORMAL HIGH (ref 65–99)
Glucose-Capillary: 169 mg/dL — ABNORMAL HIGH (ref 65–99)

## 2015-06-12 MED ORDER — FUROSEMIDE 20 MG PO TABS: 20.0000 mg | ORAL_TABLET | Freq: Every day | ORAL | Status: AC

## 2015-06-12 MED ORDER — RESOURCE THICKENUP CLEAR PO POWD: 1.0000 | ORAL | Status: AC | PRN

## 2015-06-12 MED ORDER — INSULIN GLARGINE 100 UNIT/ML ~~LOC~~ SOLN: 10.0000 [IU] | Freq: Every day | SUBCUTANEOUS | Status: AC

## 2015-06-12 MED ORDER — INSULIN GLARGINE 100 UNIT/ML ~~LOC~~ SOLN
10.0000 [IU] | Freq: Every day | SUBCUTANEOUS | Status: DC
  Administered 2015-06-12: 10 [IU] via SUBCUTANEOUS
  Filled 2015-06-12 (×2): qty 0.1

## 2015-06-12 MED ORDER — DOCUSATE SODIUM 100 MG PO CAPS: 100.0000 mg | ORAL_CAPSULE | Freq: Every day | ORAL | Status: AC

## 2015-06-12 MED ORDER — CARVEDILOL 6.25 MG PO TABS: 6.2500 mg | ORAL_TABLET | Freq: Two times a day (BID) | ORAL | Status: AC

## 2015-06-12 MED ORDER — LEVETIRACETAM 100 MG/ML PO SOLN: 1000.0000 mg | Freq: Two times a day (BID) | ORAL | Status: AC

## 2015-06-12 MED ORDER — INSULIN ASPART 100 UNIT/ML ~~LOC~~ SOLN: 0.0000 [IU] | Freq: Three times a day (TID) | SUBCUTANEOUS | Status: AC

## 2015-06-12 MED ORDER — ASPIRIN 325 MG PO TABS: 325.0000 mg | ORAL_TABLET | Freq: Every day | ORAL | Status: AC

## 2015-06-12 MED ORDER — INSULIN ASPART 100 UNIT/ML ~~LOC~~ SOLN
0.0000 [IU] | Freq: Three times a day (TID) | SUBCUTANEOUS | Status: DC
  Administered 2015-06-12: 2 [IU] via SUBCUTANEOUS
  Administered 2015-06-13: 5 [IU] via SUBCUTANEOUS

## 2015-06-12 MED ORDER — NICOTINE 7 MG/24HR TD PT24: 7.0000 mg | MEDICATED_PATCH | Freq: Every day | TRANSDERMAL | Status: AC

## 2015-06-12 NOTE — Progress Notes (Signed)
Speech Language Pathology Treatment: Dysphagia  Patient Details Name: Ian Garcia MRN: [id number] DOB: 1955-08-06 Today's Date: 06/12/2015 Time: [id number] SLP Time Calculation (min) (ACUTE ONLY): 15 min  Assessment / Plan / Recommendation Clinical Impression  Pt is currently on a Dys 2 diet with nectar-thick liquids. Pt positioned upright in bed and trialed with single ice chips x5. Pt demonstrated immediate cough x 1. Per palpation pt demonstrates appearance of pharyngeal delay. Thin water via 1/2 t was trialed x2 and both trials resulted in immediate cough. No further trials. Recommend continue with current diet. Pt was oriented x self only despite frequent attempts at reorienting.    HPI HPI: 60 y.o. male with h/o DM, diabetic neuropathy, seizures, HTN, acid reflux, hypercholesteremia, peripheral vascular disease due to secondary DM, CVA, carotid stenosis s/p CEA on L and tobacco abuse, who presented to ED with acute L side weakness since 3/14. MR Brain 3/16 acute posterior R MCA territory infarct. MRA Head 3/16 no emergent large vessel occlusion to correspond with acute posterior R MCA infarct.      SLP Plan  Continue with current plan of care     Recommendations  Diet recommendations: Dysphagia 2 (fine chop);Nectar-thick liquid Liquids provided via: Cup Medication Administration: Whole meds with puree Supervision: Patient able to self feed;Staff to assist with self feeding;Full supervision/cueing for compensatory strategies Compensations: Slow rate;Small sips/bites;Effortful swallow Postural Changes and/or Swallow Maneuvers: Seated upright 90 degrees             Plan: Continue with current plan of care     GO              Ian KyleKara Jadynn Epping, MA, CCC-SLP    Rocky CraftsKara E Jeury Garcia 06/12/2015, 12:37 PM

## 2015-06-12 NOTE — Discharge Summary (Addendum)
Stroke Discharge Summary  Patient ID: Ian Garcia    l   MRN: [id number]      DOB: [birth year]  Date of Admission: 05/22/2015 Date of Discharge: 06/13/2015  Attending Physician:  Micki RileyPramod S Sethi, MD, Stroke MD Consulting Physician(s):   Cyril Mourningakesh Alva, MD (critical care), Zoila ShutterKenneth Hilty, MD (cardiology), Chuck Hinthristopher S Dickson, MD (vascular vein surgeon)   Patient's PCP:  Earney NavyHUGHEY, REBEKAH L, MD  DISCHARGE DIAGNOSIS:  Principal Problem:   Right middle cerebral artery stroke (HCC) - Non-dominant right MCA territory infarct thomboembolic secondary to large vessel disease from near right carotid artery occlusion status post right CEA 05/31/2015 Active Problems:   Resultant Left hemiplegia, dysphagia   Carotid stenosis status post right CEA 05/31/2015   Type II diabetes mellitus with complication, uncontrolled (HCC)   Peripheral vascular disease due to secondary diabetes mellitus (HCC)   Seizure disorder (HCC)   Acute on chronic kidney injury (HCC)   Below knee amputation status (HCC)   Tobacco abuse   Cerebral infarction due to unspecified mechanism   S/P AKA (above knee amputation) bilateral (HCC)   Cardiomyopathy (HCC)   Acute systolic CHF (congestive heart failure), NYHA class 3 (HCC)     Dysphagia, pharyngoesophageal phase   Dehydration   Hypotension, resolved Dehydration, resolved   Hyperlipidemia Urinary retention Hyperkalemia, resolved to 4.1.  Poor medical compliance Aspiration, improving Pseudohypocalcemia    BMI: Body mass index is 36.59 kg/(m^2).  Past Medical History  Diagnosis Date  . Diabetes mellitus (HCC)   . Diabetic neuropathy (HCC)   . Seizures (HCC)   . Hypertension   . Acid reflux   . Hypercholesteremia   . Peripheral vascular disease due to secondary diabetes mellitus (HCC)   . CVA (cerebral infarction)   . Carotid stenosis     s/p CEA on the left  . Glaucoma    Past Surgical History  Procedure Laterality Date  . Eye surgery    . Left femoral to  tibial [facility]  . Amputation left great toe  02/2012    [facility]  . Cea      On the left  . Endarterectomy Right 05/31/2015    Procedure: RIGHT CAROTID ENDARTERECTOMY ;  Surgeon: Chuck Hinthristopher S Dickson, MD;  [facility];  Service: Vascular;  Laterality: Right;  . Carotid-subclavian bypass graft Right 05/31/2015    Procedure: RIGHT BYPASS GRAFT CAROTID-SUBCLAVIAN - USING HEMASHIELD GOLD 8MM BY 30 CM GRAFT;  Surgeon: Chuck Hinthristopher S Dickson, MD;  Location: [facility] OR;  Service: Vascular;  Laterality: Right;      Medication List    STOP taking these medications        acetaminophen 325 MG tablet  Commonly known as:  TYLENOL     amLODipine 5 MG tablet  Commonly known as:  NORVASC     ciprofloxacin 500 MG tablet  Commonly known as:  CIPRO     clopidogrel 75 MG tablet  Commonly known as:  PLAVIX     glipiZIDE 10 MG tablet  Commonly known as:  GLUCOTROL     HYDROcodone-acetaminophen 5-325 MG tablet  Commonly known as:  NORCO/VICODIN     insulin regular 100 units/mL injection  Commonly known as:  NOVOLIN R,HUMULIN R     metFORMIN 500 MG tablet  Commonly known as:  GLUCOPHAGE     metoprolol 50 MG tablet  Commonly known as:  LOPRESSOR      TAKE these medications  amitriptyline 25 MG tablet  Commonly known as:  ELAVIL  Take 25 mg by mouth at bedtime.     aspirin 325 MG tablet  Take 1 tablet (325 mg total) by mouth daily.     atorvastatin 80 MG tablet  Commonly known as:  LIPITOR  Take 80 mg by mouth daily.     carvedilol 6.25 MG tablet  Commonly known as:  COREG  Take 1 tablet (6.25 mg total) by mouth 2 (two) times daily with a meal.     cholecalciferol 400 units Tabs tablet  Commonly known as:  VITAMIN D  Take 800 Units by mouth daily.     docusate sodium 100 MG capsule  Commonly known as:  COLACE  Take 1 capsule (100 mg total) by mouth daily.     furosemide 20 MG tablet  Commonly known as:  LASIX  Take 1 tablet (20  mg total) by mouth daily.     gabapentin 300 MG capsule  Commonly known as:  NEURONTIN  Take 900 mg by mouth 2 (two) times daily.     insulin aspart 100 UNIT/ML injection  Commonly known as:  novoLOG  Inject 0-15 Units into the skin 3 (three) times daily with meals.     insulin glargine 100 UNIT/ML injection  Commonly known as:  LANTUS  Inject 0.1 mLs (10 Units total) into the skin at bedtime.     levETIRAcetam 100 MG/ML solution  Commonly known as:  KEPPRA  Take 10 mLs (1,000 mg total) by mouth 2 (two) times daily.     magnesium oxide 400 MG tablet  Commonly known as:  MAG-OX  Take 800 mg by mouth daily.     nicotine 7 mg/24hr patch  Commonly known as:  NICODERM CQ - dosed in mg/24 hr  Place 1 patch (7 mg total) onto the skin daily.     RESOURCE THICKENUP CLEAR Powd  Take 120 g by mouth as needed.        LABORATORY STUDIES CBC    Component Value Date/Time   WBC 9.5 06/05/2015 0542   RBC 4.25 06/05/2015 0542   HGB 11.1* 06/05/2015 0542   HCT 36.5* 06/05/2015 0542   PLT 378 06/05/2015 0542   MCV 85.9 06/05/2015 0542   MCH 26.1 06/05/2015 0542   MCHC 30.4 06/05/2015 0542   RDW 13.5 06/05/2015 0542   LYMPHSABS 2.1 05/22/2015 1943   MONOABS 0.6 05/22/2015 1943   EOSABS 0.2 05/22/2015 1943   BASOSABS 0.1 05/22/2015 1943   CMP    Component Value Date/Time   NA 141 06/05/2015 0542   K 4.1 06/05/2015 0542   CL 103 06/05/2015 0542   CO2 29 06/05/2015 0542   GLUCOSE 193* 06/05/2015 0542   BUN 19 06/05/2015 0542   CREATININE 1.71* 06/05/2015 0542   CALCIUM 8.1* 06/05/2015 0542   PROT 5.7* 05/23/2015 0917   ALBUMIN 2.1* 05/23/2015 0917   AST 18 05/23/2015 0917   ALT 13* 05/23/2015 0917   ALKPHOS 90 05/23/2015 0917   BILITOT 0.6 05/23/2015 0917   GFRNONAA 42* 06/05/2015 0542   GFRAA 49* 06/05/2015 0542   COAGS Lab Results  Component Value Date   INR 1.02 11/19/2009   Lipid Panel    Component Value Date/Time   CHOL 264* 05/24/2015 0357   TRIG 271*  05/24/2015 0357   HDL 30* 05/24/2015 0357   CHOLHDL 8.8 05/24/2015 0357   VLDL 54* 05/24/2015 0357   LDLCALC 180* 05/24/2015 0357   HgbA1C  Lab  Results  Component Value Date   HGBA1C 12.8* 05/24/2015   Cardiac Panel (last 3 results) No results for input(s): CKTOTAL, CKMB, TROPONINI, RELINDX in the last 72 hours. Urinalysis    Component Value Date/Time   COLORURINE YELLOW 06/06/2015 0310   APPEARANCEUR CLOUDY* 06/06/2015 0310   LABSPEC 1.029 06/06/2015 0310   PHURINE 6.0 06/06/2015 0310   GLUCOSEU 250* 06/06/2015 0310   HGBUR LARGE* 06/06/2015 0310   BILIRUBINUR NEGATIVE 06/06/2015 0310   KETONESUR NEGATIVE 06/06/2015 0310   PROTEINUR >300* 06/06/2015 0310   NITRITE NEGATIVE 06/06/2015 0310   LEUKOCYTESUR NEGATIVE 06/06/2015 0310   Urine Drug Screen No results found for: LABOPIA, COCAINSCRNUR, LABBENZ, AMPHETMU, THCU, LABBARB  Alcohol Level No results found for: [facility]   SIGNIFICANT DIAGNOSTIC STUDIES  Ct Head Wo Contrast 05/28/2015   Continued normal expected interval evolution of moderate sized right MCA infarct. No hemorrhagic transformation or other complication identified. No other new intracranial process.  05/22/2015   1. Subacute nonhemorrhagic infarction high in the right parietal lobe with secondary edema of that portion of the brain without midline shift. 2. Multiple small scalp contusions. 3. No acute abnormality of the cervical spine.   Ct Angio Head & Neck W/cm &/or Wo/cm 05/30/2015   1. New High-grade Radiographic-String-Sign stenosis of the Right CCA at the level of the larynx since 2011. Bulky 17 mm Thrombus distal to this stenosis adherent to the wall of the vessel at this time. 2. Surgery to the left carotid bifurcation since 2011 with restored patency of the left ICA origin. Progressed soft plaque in the left CCA resulting in stenosis of 50-60% at the level of the thyroid cartilage. 3. 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 No distal  reconstituted flow. 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 4. Right vertebral artery progressed soft plaque in the V2 segment at the C3 level with new severe stenosis. Increased soft plaque in the V4 segment with new moderate stenosis. 5. Layering pleural effusions, up to moderate on the left and small on the right. 6. Poor dentition.  Chronic right mastoid inflammation.   Ct Cervical Spine Wo Contrast 05/22/2015   1. Subacute nonhemorrhagic infarction high in the right parietal lobe with secondary edema of that portion of the brain without midline shift. 2. Multiple small scalp contusions. 3. No acute abnormality of the cervical spine.   Mr Brain Wo Contrast 05/23/2015   1. Acute posterior right MCA territory infarct corresponding to the abnormal CT appearance yesterday. Cytotoxic edema without intracranial mass effect at this time. 2. Intracranial MRA from today reported separately. 3. Moderately to severely motion degraded and the examination had to be discontinued prior to completion due to patient agitation. 4. Consider surveillance for associated intracranial hemorrhage and mass effect with noncontrast CT.   [name] Head Wo Contrast 05/23/2015  Motion degraded. No emergent large vessel occlusion to correspond with the acute posterior right MCA infarct.   US Renal 05/23/2015   1. No hydronephrosis. 2. Echogenic normal size kidneys, indicating nonspecific renal parenchymal disease of uncertain chronicity. 3. Mild diffuse bladder wall thickening and trabeculation, suggesting chronic bladder voiding dysfunction such as due to chronic bladder outlet obstruction by an enlarged prostate gland. The prostate gland is not evaluated on this scan.   US Carotid Bilateral 05/23/2015   Right: Nearly occlusive centralized thrombus/plaque within the right common carotid artery which contributes to post stenotic and decreased antegrade flow of the right ICA. This plaque is favored to represent acute  embolism/thrombus given the morphology. Formal cerebral angiogram is recommended. As a second best test, CTA. Left: The patient is status post left carotid endarterectomy in September 2011, with atherosclerotic changes of the common carotid artery. Established duplex criteria have not been validated for assessment after surgery, however, there is no evidence of compromised flow. High diastolic component of the flow on the left suggests increase contribution, potentially to the right hemisphere given the small infarction on recent CT.    Dg Chest 2 View 06/05/2015   Mild bibasilar atelectasis.  05/22/2015  No acute abnormalities.   Dg Chest Port 1 View 06/05/2015   Minimal basilar atelectasis. Interval central line removal  06/03/2015   Persistent postoperative change with soft tissue air in the right supraclavicular region. Mild bibasilar atelectatic change. No edema or consolidation. Stable cardiac prominence.  05/31/2015   1. Left jugular catheter terminates in the region of the left brachiocephalic vein. 2. Hypoinflation with left basilar atelectasis versus consolidation.   Dg Shoulder Left 05/22/2015   Negative.   EEG  This is an abnormal EEG demonstrating posterior slowing on the right compared to that of the left, consistent with the patient's history of a new right PCA infarct. There was no epileptiform activity recorded on this recording.  2D echo Upper normal LV chamber size with mild LVH and LVEF approximately 35%. There is diffuse hypokinesis with akinesis of the basal inferolateral/inferior wall. Grade 1 diastolic dysfunction with indeterminate filling pressure. Upper normal left atrial chamber size. Trivial mitral regurgitation. No obvious PFO or ASD.  Modified Barium Swallow with speech therapy 06/06/2015 Moderate aspiration risk. Continue dysphagia 2 diet with nectar thick liquids     HISTORY OF PRESENT ILLNESS  Ian Garcia is an 60 year old male with history of uncontrolled  diabetes mellitus with complications including diabetic neuropathy, peripheral vascular disease, gangrene and osteomyelitis of the leg status post bilateral below-knee amputations, wheelchair-bound, hypertension, seizure disorder, history of stroke, carotid stenosis status post left CEA, hyperlipidemia, glaucoma and ongoing tobacco use who was brought from home with acute left-sided weakness since 05/21/15 (LKW 05/21/2015 time unknown). He was not considered for tPA there due to delay in arrival.  He was admitted to the hospitalist service at [facility] 05/22/2015 (arrived 05/22/2015 at 1547). As part of the stroke workup he had ultrasound of the carotids done which showed a near occlusive thrombus in the right ICA. MRI of the brain was performed which was of poor quality due to motion artifacts. It Did show a posterior parietal acute right MCA infarct. Due to the abnormality seen on the carotid ultrasound with a near occlusive acute thrombus, he was transferred to [facility] for further evaluation and management options including any further interventions. His been transferred to the ICU. Patient is a poor historian. Reports living alone. No family available at bedside.   HOSPITAL COURSE  Mr. SANTANNA OLENIK is a 60 y.o. male with history of DM, seizures, HTN, B AKAs, previous stroke, HLD, and carotid stenosis presenting with acute onset of left-sided weakness. He did not receive IV t-PA due to late presentation.  Stroke: Non-dominant right MCA territory infarct thomboembolic secondary to large vessel disease from near right carotid artery occlusion status post resultant right CEA 05/31/2015  Resultant Left hemiplegia, dysphagia - worsening of L HP led to more timely CEA  MRI Acute posterior right MCA territory infarct with cytotoxic edema  MRA No emergent large vessel occlusion to correspond with the acute posterior right MCA infarct.  Carotid Doppler - near occlusion of right  carotid artery.   CTA of neck - New High-grade Radiographic-String-Sign stenosis of the Right CCA with thrombus  2D Echo - EF 35%  LDL  180  HgbA1c 12.6  clopidogrel 75 mg daily prior to admission, treated with aspirin 325 mg daily and clopidogrel 75 mg daily in hospital. As post CEA, will change to long-term aspirin 325 mg daily at discharge.  Patient counseled to be compliant with his antithrombotic medications  Ongoing aggressive stroke risk factor management  Therapy recommendations: SNF (pt has VA insurance, difficult placement given VA involvement)  Disposition:  SNF  Right ICA near occlusion  Cause of right MCA infarct  [facility] consult with Dr.Dickson  S/P Right CEA 05/31/15 and right subclavian to carotid bypass with excision of a supraclavicular mass -> seborrheic keratosis.  Postop course uneventful  Hx left CEA  Hypotension, resolved  Had been treated with IV fluids and dopamine  EF 35%  Cardiomyopathy and chronic Systolic CHF  EF 16%  Low BP  Cardiology consulted - poor candidate for invasive cardiology procedures  Continue coreg, increased to 6.25mg  bid  No ACEI or ARB due to renal function  Lasix  daily   Dysphagia  Not noted on admission (passed swallow screen)  Post op, increased dysphagia and ST involved  Initially honey thick liquids  Progressed to Dysphagia 2 diet with nectar thick liquids   Patient able to feed himself  Speech therapy following  Hope for ongoing improvement  Acute on chronic kidney disease Dehydration   Elevated creatinine 1.71 -> 2.19->1.99->1.71  Elevated Bun 15->27->26->19.   Lasix  daily  stable  Seizure disorder  Hx of seizure, on no antiepileptics on admission  Reported event of left arm shaking 3/17 treated with ativan and keppra  EEG showed right slowing, no seizure  Continue keppra  bid  Hyperlipidemia  Home meds:  Lipitor 80 mg daily resumed in hospital  LDL 180, goal < 70  Continue statin at discharge  Consider adding Zetia   Diabetes  HgbA1c 12.6, goal < 7.0  Uncontrolled  Noncompliance  Discussed with diabetic RN - for discharge recommends Lantus 10 mg q hs and moderate SSI ac & hs  Tobacco abuse  Current smoker  Smoking cessation counseling provided  He has not been smoking in the hospital  Pt is NOT willing to quit  Other Stroke Risk Factors  Hx stroke/TIA  S/p left CEA  Peripheral vascular disease, w/ B/L AKA  Other Active Problems  Urinary retention. Foley catheter utilized short period during hospitalization. Intermittent I&O cath.  Hyperkalemia, resolved to 4.1.   Poor medical compliance  Occasional low-grade fevers ~ 99 - possibly secondary to aspiration - CXR 06/05/2015 - mild bibasilar atelectasis.  Pseudohypocalcemia - corrects    DISCHARGE EXAM  Blood pressure 139/68, pulse 69, temperature 98.6 F (37 C), temperature source Oral, resp. rate 18, height  (1.422 m), weight 73.982 kg (163 lb 1.6 oz), SpO2 98 %. General - chronically ill-looking, well developed, in no apparent distress. Cardiovascular - Regular rate and rhythm. Chest: clear  Abd: Soft NT, ND normal bowel sounds Extremities: Left AKA and right BKA  Mental Status -  Awake alert, orientation to year (not month), place, and person were intact. Language including expression, naming, repetition, comprehension was assessed and found intact.  Cranial Nerves II - XII - II - . Does not blink to threat bilaterally and poor effort on exam when testing EOM.  But lateral gaze bilaterally intact when I move to each side  III, IV, VI - lateral gaze intact  V - Facial sensation intact bilaterally. VII - Facial movement intact bilaterally. VIII - Hearing & vestibular intact bilaterally. X - Palate elevates symmetrically. XI - Chin turning & shoulder shrug intact bilaterally. XII - Tongue  protrusion intact.  Motor Strength - The patient's strength was 1/5 LUE and left leg stump, 5/5 RUE and right leg stump, bilateral AKA. Bulk was normal and fasciculations were absent.  Motor Tone - Muscle tone was assessed at the neck and appendages and was normal.  Sensory - Light touch, temperature/pinprick were assessed and were symmetrical.   Coordination - The patient had normal movements in the right hand with no ataxia or dysmetria.   Gait and Station - not able to test.   Discharge Diet   DIET DYS 2 Room service appropriate?: Yes; Fluid consistency:: Nectar Thick liquids  DISCHARGE PLAN  Disposition:  Discharge to skilled nursing facility for ongoing PT, OT and ST.    aspirin 325 mg daily and clopidogrel 75 mg daily for secondary stroke prevention.  Follow-up HUGHEY, REBEKAH L, MD in 2 weeks or MD at nursing home.  Follow-up with Dr. Delia Heady, Stroke Clinic in 2 months or neurologist in his area.  90 minutes were spent preparing discharge.  Rhoderick Moody [facility] See Amion for Pager information 06/13/2015 8:37 AM .   I have personally examined this patient, reviewed notes, independently viewed imaging studies, participated in medical decision making and plan of care. I have made any additions or clarifications directly to the above note. Agree with note above.   Delia Heady, MD Medical Director [facility] Stroke Center Pager: [phone] 06/13/2015 2:54 PM

## 2015-06-12 NOTE — Clinical Social Work Note (Signed)
Patient has officially been approved for 45 day SNF placement by [facility]. Per [facility], [facility]. [facility] [place] [facility].  [facility] contact: Jill SideColleen, [phone] [facility] contact: Sharman CheekJenny Wade, [phone]  Marcelline DeistEmily Blossom Crume, LCSW [phone] Orthopedics: [phone] Surgical: [phone]

## 2015-06-13 LAB — GLUCOSE, CAPILLARY
GLUCOSE-CAPILLARY: 213 mg/dL — AB (ref 65–99)
Glucose-Capillary: 257 mg/dL — ABNORMAL HIGH (ref 65–99)
Glucose-Capillary: 223 mg/dL — ABNORMAL HIGH (ref 65–99)

## 2015-06-13 NOTE — Progress Notes (Signed)
[facility] given [facility]

## 2015-06-13 NOTE — Clinical Social Work Note (Signed)
[facility]. Report number provided to RN. [facility].  Marcelline Deistmily Allea Kassner, LCSW [phone] Orthopedics: [phone] Surgical: [phone]

## 2015-06-13 NOTE — Care Management Note (Signed)
Case Management Note  Patient Details  Name: Ian Garcia MRN: [id number] Date of Birth: 02/12/1956  Subjective/Objective:                    Action/Plan: Patient discharging to SNF today [facility]. No further needs per CM.   Expected Discharge Date:                  Expected Discharge Plan:  Skilled Nursing Facility  In-House Referral:  Clinical Social Work  Discharge planning Services  CM Consult  Post Acute Care Choice:    Choice offered to:     DME Arranged:    DME Agency:     HH Arranged:    HH Agency:     Status of Service:  Completed, signed off  Medicare Important Message Given:    Date Medicare IM Given:    Medicare IM give by:    Date Additional Medicare IM Given:    Additional Medicare Important Message give by:     If discussed at Long Length of Stay Meetings, dates discussed:    Additional Comments:  Kermit BaloKelli F Geraldyn Shain, RN 06/13/2015, 10:13 AM

## 2015-06-13 NOTE — Progress Notes (Signed)
[facility] [phone]. ambulance company picked pt up and discharge

## 2015-06-13 NOTE — Progress Notes (Signed)
STROKE TEAM PROGRESS NOTE   SUBJECTIVE (INTERVAL HISTORY) Patient ready to go to nursing home. Per care management, bed available today.   PHYSICAL EXAM Temp:  [98.3 F (36.8 C)-98.6 F (37 C)] 98.6 F (37 C) (04/06 0516) Pulse Rate:  [69-80] 69 (04/06 0516) Resp:  [18-20] 18 (04/06 0516) BP: (100-139)/(51-68) 139/68 mmHg (04/06 0516) SpO2:  [97 %-99 %] 98 % (04/06 0516) Weight:  [73.982 kg (163 lb 1.6 oz)] 73.982 kg (163 lb 1.6 oz) (04/06 0516)  General - cachetic looking, well developed, in no apparent distress. Cardiovascular - Regular rate and rhythm. Chest:  CTA Abd:  Soft NT, ND normal bowel sounds Extremities:  Left AKA and right BKA  Mental Status -  Awake alert, orientation to year (not month), place, and person were intact. Language including expression, naming, repetition, comprehension was assessed and found intact.  Cranial Nerves II - XII - II -  . Does not blink to threat bilaterally and poor effort on exam when testing EOM. But lateral gaze bilaterally  intact when I move to each side  III, IV, VI - lateral gaze intact  V - Facial sensation intact bilaterally. VII - Facial movement intact bilaterally. VIII - Hearing & vestibular intact bilaterally. X - Palate elevates symmetrically. XI - Chin turning & shoulder shrug intact bilaterally. XII - Tongue protrusion intact.  Motor Strength - The patient's strength was 1/5 LUE and left leg stump, 5/5 RUE and right leg stump, bilateral AKA.  Bulk was normal and fasciculations were absent.   Motor Tone - Muscle tone was assessed at the neck and appendages and was normal.  Sensory - Light touch, temperature/pinprick were assessed and were symmetrical.    Coordination - The patient had normal movements in the right hand with no ataxia or dysmetria.    Gait and Station - not able to test.   ASSESSMENT/PLAN Mr. Ian Garcia is a 60 y.o. male with history of DM, seizures, htn, previous stroke, hld, and carotid  stenosis presenting with acute onset of left-sided weakness. He did not receive IV t-PA due to late presentation.  Stroke:  Non-dominant right MCA territory infarct thomboembolic secondary to large vessel disease from near right carotid artery occlusion status post resultant right CEA this admission  Resultant  Left hemiplegia, dysphagia  VTE prophylaxis - subcutaneous heparin DIET DYS 2 Room service appropriate?: Yes; Fluid consistency:: Nectar Thick high risk for aspiration - ST following  clopidogrel 75 mg daily prior to admission, now on aspirin 325 mg daily and clopidogrel 75 mg daily.  Therapy recommendations:  SNF   Disposition: Pending - Medically ready for discharge. No change in condition over night  Right ICA near occlusion  S/P Right CEA 05/31/15 and right subclavian to carotid bypass   Hypotension, resolved   Cardiomyopathy and chronic Systolic CHF  Dysphagia  Dehydration, resolved   Seizure disorder, on Keppra  Hyperlipidemia  Diabetes  Tobacco abuse  Other Stroke Risk Factors  Urinary retention. intermittent catheterization B/L AKA Poor medical compliance  Hospital day # 21   [name],[name]  [facility] See Amion for Pager information 06/13/2015 8:35 AM  I have personally examined this patient, reviewed notes, independently viewed imaging studies, participated in medical decision making and plan of care. I have made any additions or clarifications directly to the above note. Agree with note above.    Delia HeadyPramod Gianni Fuchs, MD Medical Director [facility] Pager: [phone] 06/13/2015 2:55 PM  To contact Stroke Continuity provider,  please refer to [email or web address] After hours, contact General Neurology

## 2015-06-19 ENCOUNTER — Ambulatory Visit: Payer: Non-veteran care | Admitting: Vascular Surgery

## 2017-05-07 IMAGING — DX DG CHEST 2V
2 series · 2 of 2 positions shown · non-contrast
Comparison: 09/18/2006

CLINICAL DATA: Fallen several times since [REDACTED], BILATERAL lower
extremity amputee, diabetes mellitus, peripheral vascular disease,
smoker

EXAM:
CHEST  2 VIEW

[chest lat]
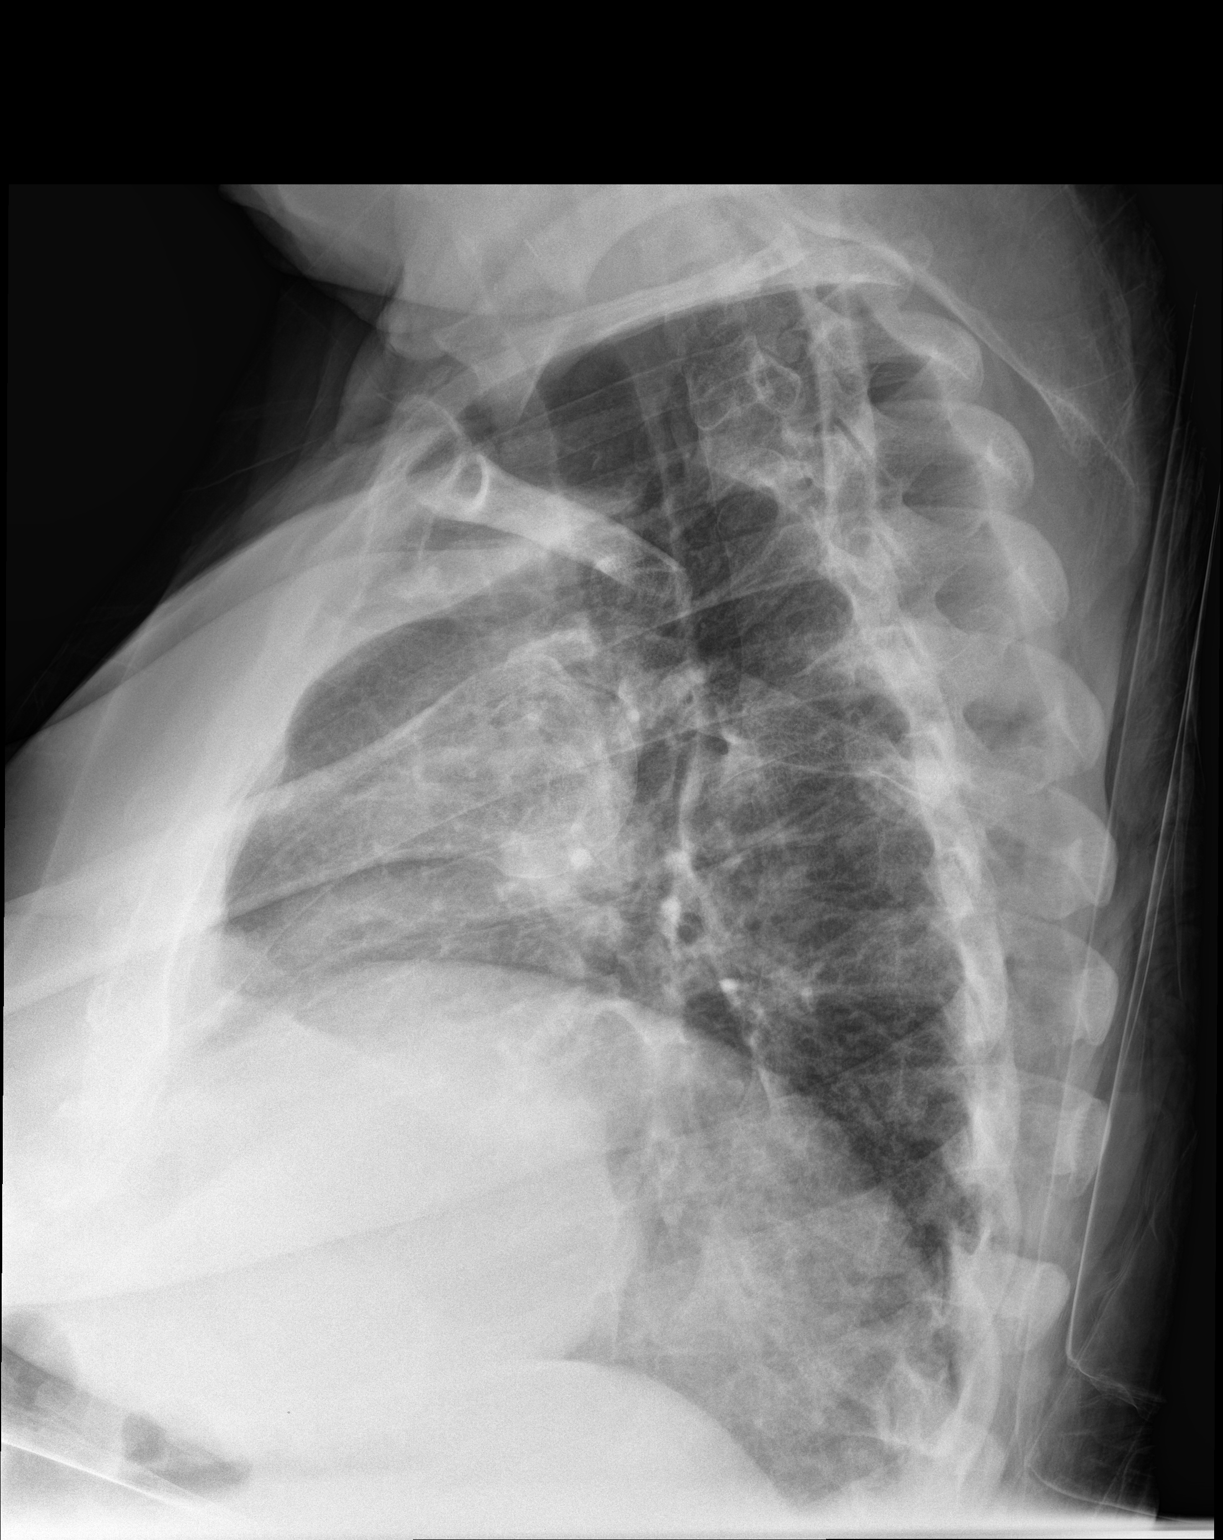

[chest ap]
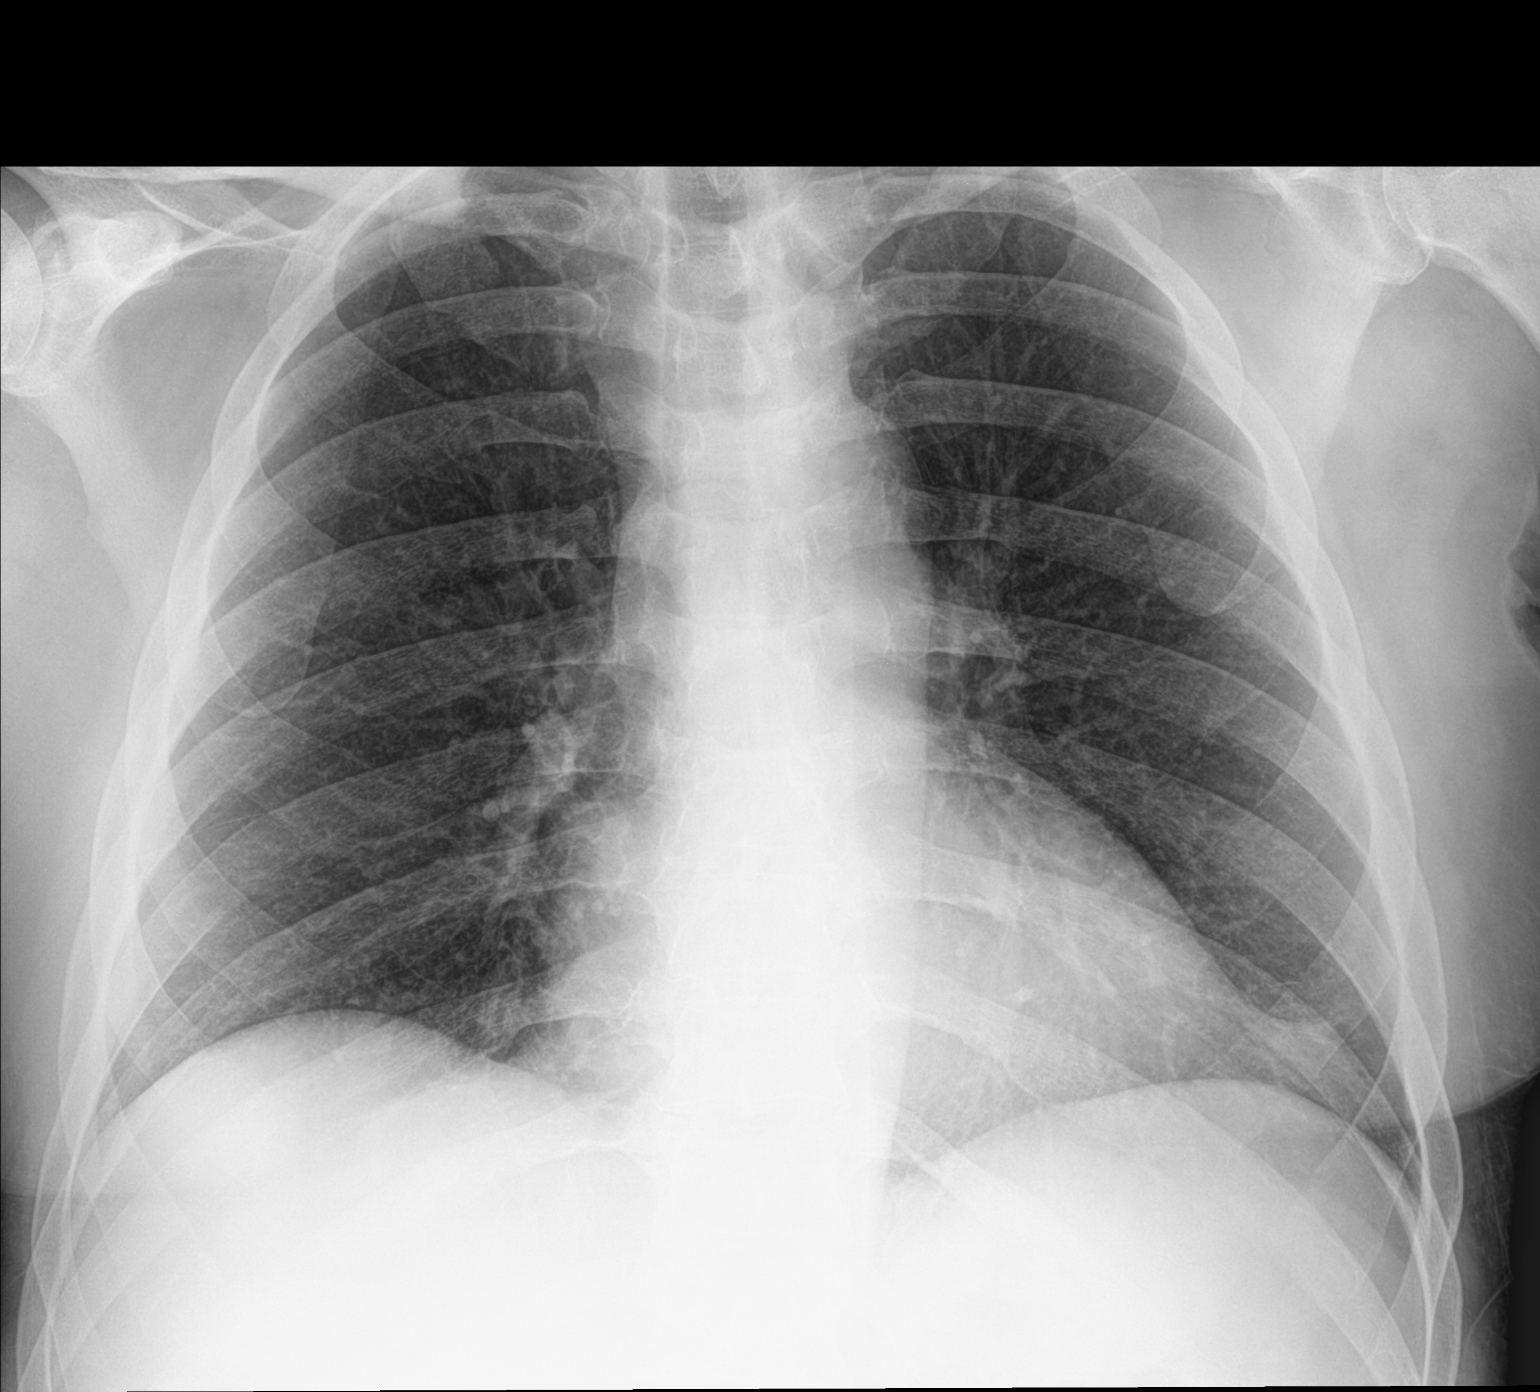

[2 of 2 positions shown; findings below may reference images not displayed]

FINDINGS: Upper normal heart size.

Mediastinal contours and pulmonary vascularity normal.

Lungs clear.

No pleural effusion or pneumothorax.

Bones unremarkable.
IMPRESSION: No acute abnormalities.

## 2017-05-07 IMAGING — CT CT HEAD W/O CM
4 of 5 series · 12 of 47 positions shown, 13 images · non-contrast
Comparison: CT scan dated 11/19/2009

CLINICAL DATA: Multiple recent falls. The patient now has left arm
weakness.

EXAM:
CT HEAD WITHOUT CONTRAST
CT CERVICAL SPINE WITHOUT CONTRAST
TECHNIQUE: Multidetector CT imaging of the head and cervical spine was
performed following the standard protocol without intravenous
contrast. Multiplanar CT image reconstructions of the cervical spine
were also generated.

[Series 2: headseq 4.8 h37s · axial · 0.43mm/px · z∈[+1191,+1278]mm · 3 of 36 slices shown, 4 images]
[im 9/36  brain]
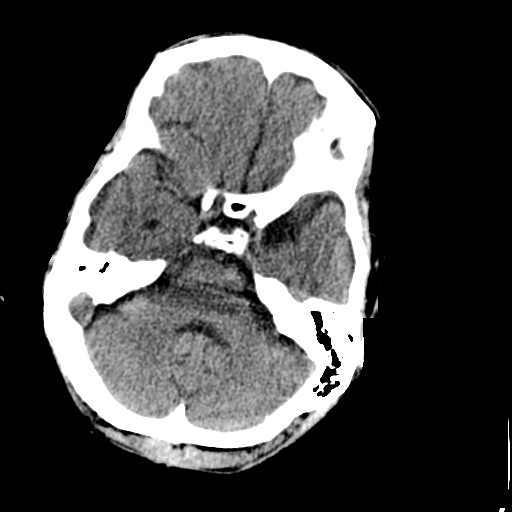
[im 9/36  bone]
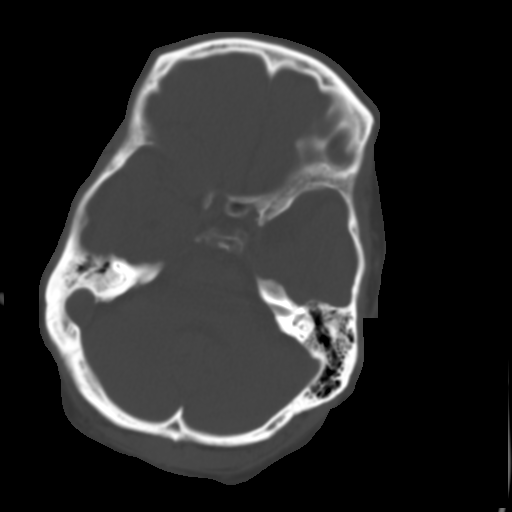
[im 18/36  brain]
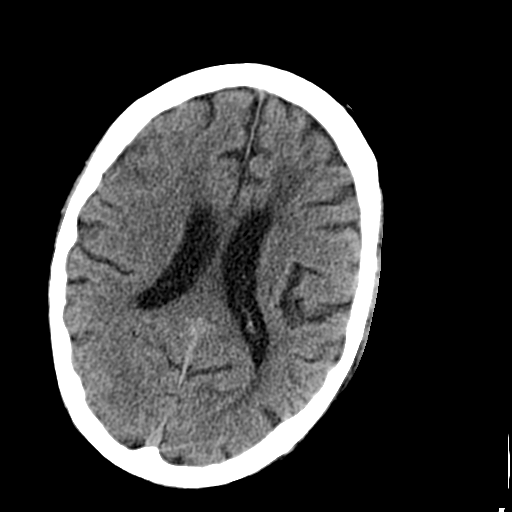
[im 27/36  brain]
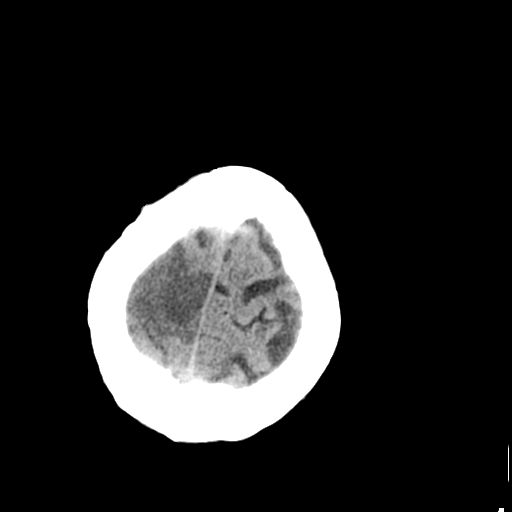

[Series 7: sagittal bone 2.0 · sagittal · 0.20mm/px · 3 of 50 slices shown]
[im 17/50  brain]
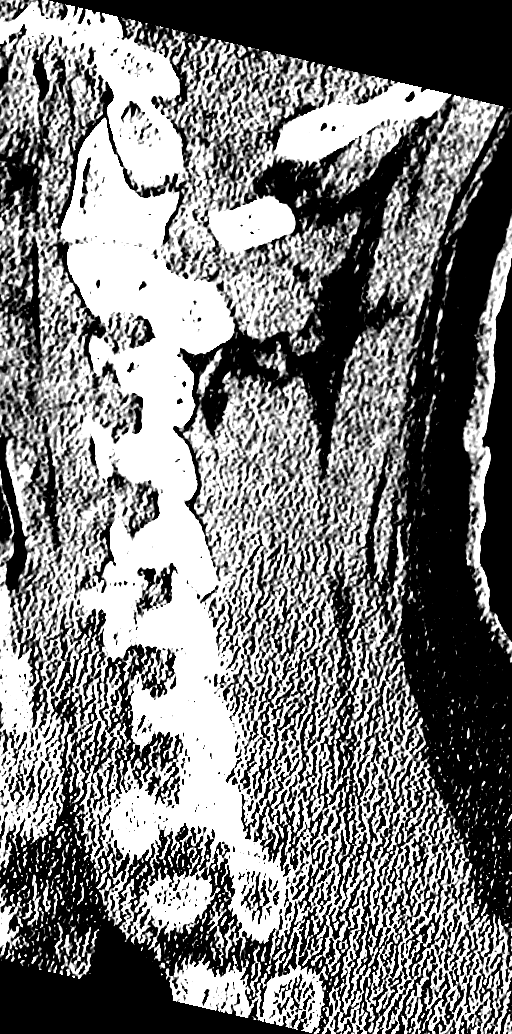
[im 25/50  brain]
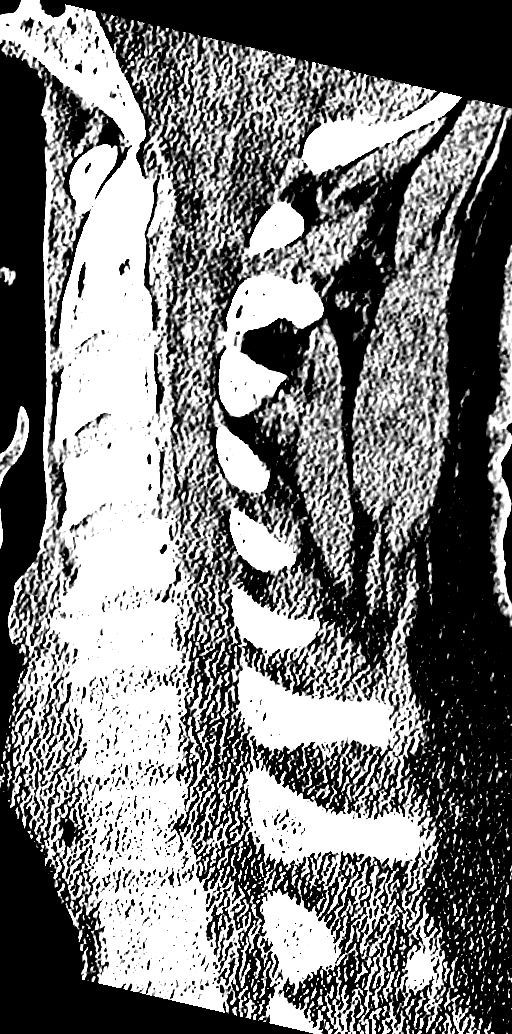
[im 33/50  brain]
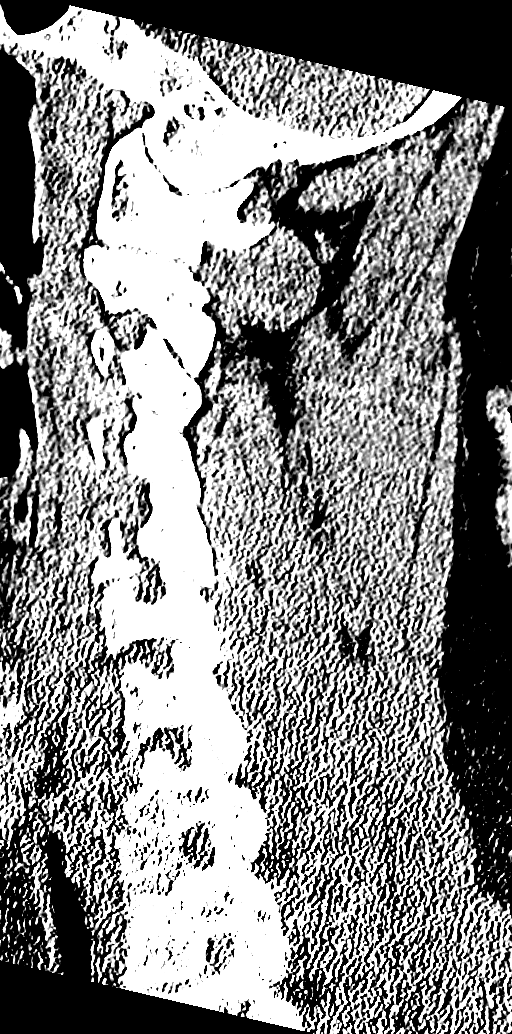

[Series 8: coronal bone 2.0 · coronal · 0.21mm/px · 3 of 49 slices shown]
[im 17/49  brain]
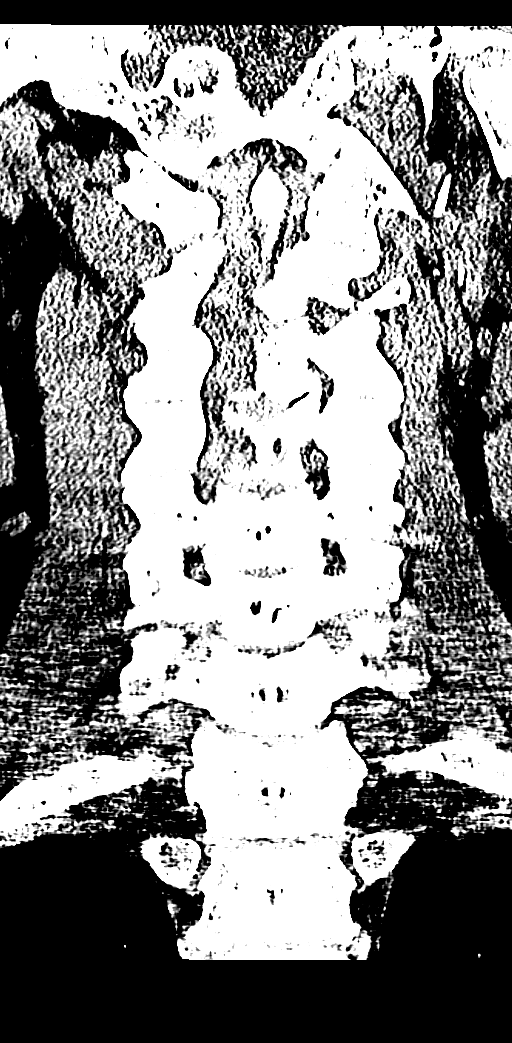
[im 22/49  brain]
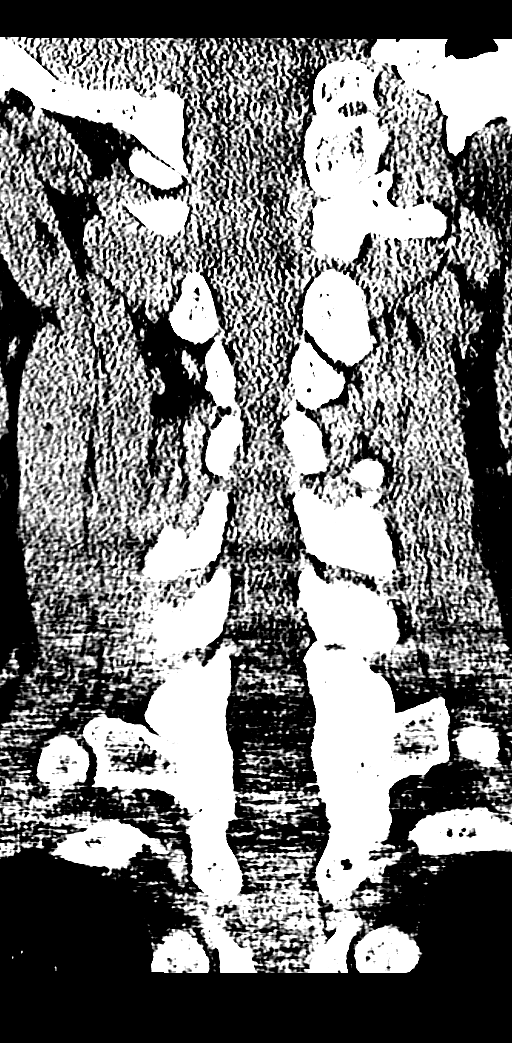
[im 27/49  brain]
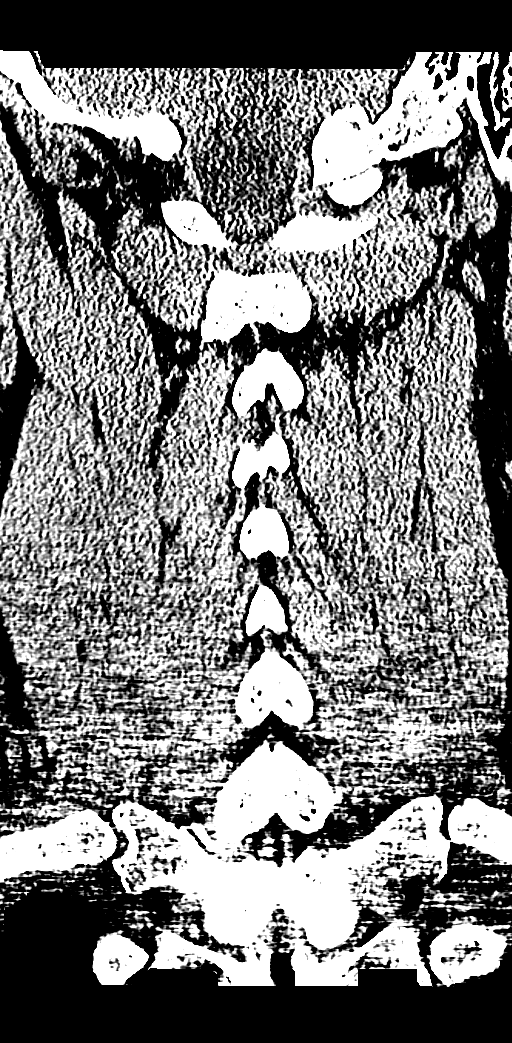

[Series 9: axial bone 2.0 · axial · 0.19mm/px · z∈[+963,+1011]mm · 3 of 102 slices shown]
[im 9/102  bone]
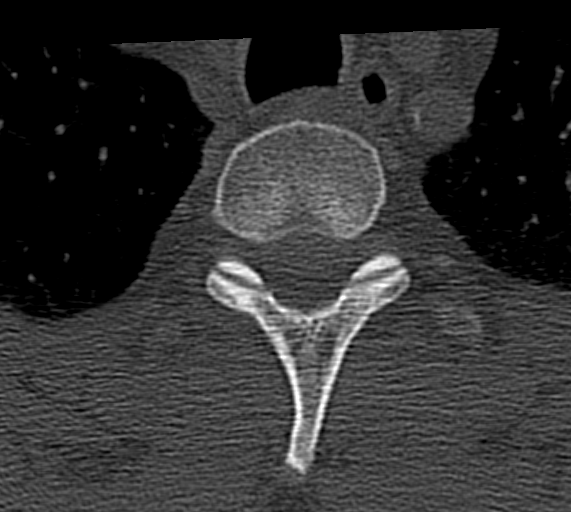
[im 26/102  bone]
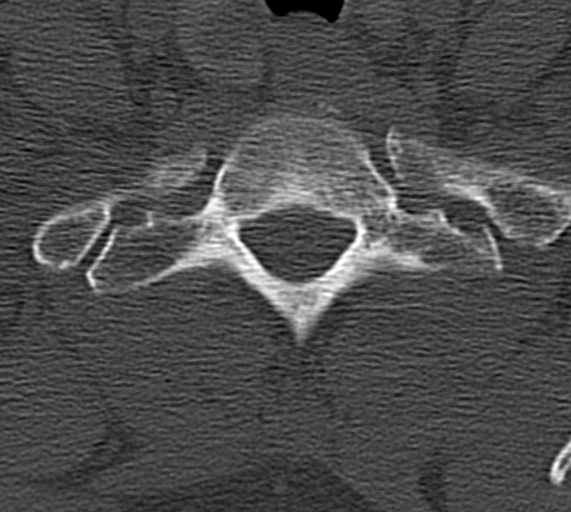
[im 34/102  bone]
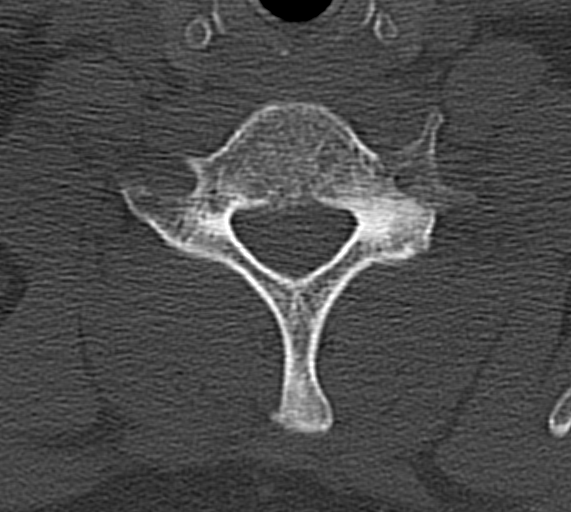

[12 of 47 positions shown; findings below may reference images not displayed]

FINDINGS: CT HEAD FINDINGS

There is a subacute nonhemorrhagic infarction high in the right
parietal lobe with secondary edema and obliteration of adjacent
cortical sulci. No midline shift. No hemorrhage.

There is diffuse slight cerebral cortical atrophy. Old infarcts in
the right frontoparietal region.

No epidural or subdural or interventricular hemorrhage.

No acute bone abnormality. Scalp contusions over the left occipital
bone and high over the left posterior parietal region.

CT CERVICAL SPINE FINDINGS

There is no fracture or subluxation or prevertebral soft tissue
swelling. Soft disc protrusion with accompanying osteophytes central
and to the left at C3-4.

Broad-based disc protrusion with accompanying osteophytes at C5-6.

Calcification in the right carotid bifurcation. Surgical clips
around the left carotid bifurcation.
IMPRESSION: 1. Subacute nonhemorrhagic infarction high in the right parietal
lobe with secondary edema of that portion of the brain without
midline shift.
2. Multiple small scalp contusions.
3. No acute abnormality of the cervical spine.

## 2017-06-07 DEATH — deceased
